# Patient Record
Sex: Male | Born: 1945 | Race: White | Hispanic: No | Smoking: Former smoker
Health system: Southern US, Community
[De-identification: ages and names within clinical notes are randomized; demographics above are authoritative.]

## PROBLEM LIST (undated history)

## (undated) DIAGNOSIS — K635 Polyp of colon: Secondary | ICD-10-CM

## (undated) DIAGNOSIS — I219 Acute myocardial infarction, unspecified: Secondary | ICD-10-CM

## (undated) DIAGNOSIS — E291 Testicular hypofunction: Secondary | ICD-10-CM

## (undated) DIAGNOSIS — M359 Systemic involvement of connective tissue, unspecified: Secondary | ICD-10-CM

## (undated) DIAGNOSIS — K219 Gastro-esophageal reflux disease without esophagitis: Secondary | ICD-10-CM

## (undated) DIAGNOSIS — Z973 Presence of spectacles and contact lenses: Secondary | ICD-10-CM

## (undated) DIAGNOSIS — K648 Other hemorrhoids: Secondary | ICD-10-CM

## (undated) DIAGNOSIS — Z8371 Family history of colonic polyps: Secondary | ICD-10-CM

## (undated) DIAGNOSIS — D472 Monoclonal gammopathy: Secondary | ICD-10-CM

## (undated) DIAGNOSIS — M1990 Unspecified osteoarthritis, unspecified site: Secondary | ICD-10-CM

## (undated) DIAGNOSIS — I10 Essential (primary) hypertension: Secondary | ICD-10-CM

## (undated) DIAGNOSIS — C9000 Multiple myeloma not having achieved remission: Secondary | ICD-10-CM

## (undated) DIAGNOSIS — D509 Iron deficiency anemia, unspecified: Secondary | ICD-10-CM

## (undated) DIAGNOSIS — E785 Hyperlipidemia, unspecified: Secondary | ICD-10-CM

## (undated) DIAGNOSIS — K2970 Gastritis, unspecified, without bleeding: Secondary | ICD-10-CM

## (undated) DIAGNOSIS — E119 Type 2 diabetes mellitus without complications: Secondary | ICD-10-CM

## (undated) DIAGNOSIS — I2510 Atherosclerotic heart disease of native coronary artery without angina pectoris: Secondary | ICD-10-CM

## (undated) DIAGNOSIS — D649 Anemia, unspecified: Secondary | ICD-10-CM

## (undated) DIAGNOSIS — Z83719 Family history of colon polyps, unspecified: Secondary | ICD-10-CM

## (undated) DIAGNOSIS — N400 Enlarged prostate without lower urinary tract symptoms: Secondary | ICD-10-CM

## (undated) HISTORY — DX: Gastritis, unspecified, without bleeding: K29.70

## (undated) HISTORY — PX: APPENDECTOMY: SHX54

## (undated) HISTORY — DX: Other hemorrhoids: K64.8

## (undated) HISTORY — DX: Hyperlipidemia, unspecified: E78.5

## (undated) HISTORY — PX: HAND SURGERY: SHX662

## (undated) HISTORY — DX: Monoclonal gammopathy: D47.2

## (undated) HISTORY — DX: Polyp of colon: K63.5

## (undated) HISTORY — DX: Multiple myeloma not having achieved remission: C90.00

## (undated) HISTORY — DX: Acute myocardial infarction, unspecified: I21.9

## (undated) HISTORY — PX: PROSTATE SURGERY: SHX751

## (undated) HISTORY — DX: Atherosclerotic heart disease of native coronary artery without angina pectoris: I25.10

## (undated) HISTORY — DX: Testicular hypofunction: E29.1

## (undated) HISTORY — DX: Unspecified osteoarthritis, unspecified site: M19.90

## (undated) HISTORY — DX: Family history of colon polyps, unspecified: Z83.719

## (undated) HISTORY — DX: Iron deficiency anemia, unspecified: D50.9

## (undated) HISTORY — DX: Anemia, unspecified: D64.9

## (undated) HISTORY — PX: OTHER SURGICAL HISTORY: SHX169

## (undated) HISTORY — PX: VASECTOMY: SHX75

## (undated) HISTORY — DX: Gastro-esophageal reflux disease without esophagitis: K21.9

## (undated) HISTORY — DX: Essential (primary) hypertension: I10

## (undated) HISTORY — DX: Benign prostatic hyperplasia without lower urinary tract symptoms: N40.0

## (undated) HISTORY — DX: Presence of spectacles and contact lenses: Z97.3

## (undated) HISTORY — DX: Family history of colonic polyps: Z83.71

---

## 1992-06-29 HISTORY — PX: KNEE SURGERY: SHX244

## 1999-11-27 ENCOUNTER — Ambulatory Visit (HOSPITAL_BASED_OUTPATIENT_CLINIC_OR_DEPARTMENT_OTHER): Admission: RE | Admit: 1999-11-27 | Discharge: 1999-11-27 | Payer: Self-pay | Admitting: *Deleted

## 2001-09-27 ENCOUNTER — Ambulatory Visit (HOSPITAL_BASED_OUTPATIENT_CLINIC_OR_DEPARTMENT_OTHER): Admission: RE | Admit: 2001-09-27 | Discharge: 2001-09-27 | Payer: Self-pay | Admitting: Urology

## 2003-05-09 ENCOUNTER — Encounter (INDEPENDENT_AMBULATORY_CARE_PROVIDER_SITE_OTHER): Payer: Self-pay | Admitting: Specialist

## 2003-05-09 ENCOUNTER — Ambulatory Visit (HOSPITAL_COMMUNITY): Admission: RE | Admit: 2003-05-09 | Discharge: 2003-05-09 | Payer: Self-pay | Admitting: Gastroenterology

## 2004-11-12 ENCOUNTER — Ambulatory Visit (HOSPITAL_BASED_OUTPATIENT_CLINIC_OR_DEPARTMENT_OTHER): Admission: RE | Admit: 2004-11-12 | Discharge: 2004-11-12 | Payer: Self-pay | Admitting: Orthopedic Surgery

## 2004-11-12 ENCOUNTER — Ambulatory Visit (HOSPITAL_COMMUNITY): Admission: RE | Admit: 2004-11-12 | Discharge: 2004-11-12 | Payer: Self-pay | Admitting: Orthopedic Surgery

## 2005-06-29 HISTORY — PX: CORONARY ANGIOPLASTY WITH STENT PLACEMENT: SHX49

## 2006-11-04 ENCOUNTER — Ambulatory Visit: Payer: Self-pay | Admitting: Internal Medicine

## 2006-11-15 ENCOUNTER — Ambulatory Visit: Payer: Self-pay

## 2006-12-03 ENCOUNTER — Ambulatory Visit: Payer: Self-pay | Admitting: Internal Medicine

## 2006-12-03 LAB — CONVERTED CEMR LAB
Basophils Relative: 0.5 % (ref 0.0–1.0)
Calcium: 9.5 mg/dL (ref 8.4–10.5)
Chloride: 98 meq/L (ref 96–112)
Creatinine, Ser: 1.1 mg/dL (ref 0.4–1.5)
Eosinophils Relative: 4.7 % (ref 0.0–5.0)
Glucose, Bld: 112 mg/dL — ABNORMAL HIGH (ref 70–99)
HCT: 40.4 % (ref 39.0–52.0)
INR: 0.9 (ref 0.9–2.0)
Neutrophils Relative %: 59.6 % (ref 43.0–77.0)
Prothrombin Time: 11.5 s (ref 10.0–14.0)
RBC: 4.67 M/uL (ref 4.22–5.81)
RDW: 12.6 % (ref 11.5–14.6)
Sodium: 137 meq/L (ref 135–145)
WBC: 6.5 10*3/uL (ref 4.5–10.5)
aPTT: 30.3 s (ref 26.5–36.5)

## 2006-12-06 ENCOUNTER — Inpatient Hospital Stay (HOSPITAL_BASED_OUTPATIENT_CLINIC_OR_DEPARTMENT_OTHER): Admission: RE | Admit: 2006-12-06 | Discharge: 2006-12-06 | Payer: Self-pay | Admitting: Cardiovascular Disease

## 2006-12-06 ENCOUNTER — Ambulatory Visit: Payer: Self-pay | Admitting: Cardiovascular Disease

## 2006-12-10 ENCOUNTER — Inpatient Hospital Stay (HOSPITAL_COMMUNITY): Admission: RE | Admit: 2006-12-10 | Discharge: 2006-12-11 | Payer: Self-pay | Admitting: Cardiology

## 2006-12-10 ENCOUNTER — Ambulatory Visit: Payer: Self-pay | Admitting: Cardiology

## 2006-12-23 ENCOUNTER — Ambulatory Visit: Payer: Self-pay | Admitting: Cardiovascular Disease

## 2006-12-23 ENCOUNTER — Ambulatory Visit: Payer: Self-pay | Admitting: Cardiology

## 2006-12-23 LAB — CONVERTED CEMR LAB
Basophils Absolute: 0 10*3/uL (ref 0.0–0.1)
Calcium: 9.4 mg/dL (ref 8.4–10.5)
Chloride: 100 meq/L (ref 96–112)
Creatinine, Ser: 1.1 mg/dL (ref 0.4–1.5)
Direct LDL: 97 mg/dL
Eosinophils Absolute: 0.3 10*3/uL (ref 0.0–0.6)
GFR calc non Af Amer: 72 mL/min
HCT: 38.2 % — ABNORMAL LOW (ref 39.0–52.0)
HDL: 34 mg/dL — ABNORMAL LOW (ref >39.0)
MCHC: 34 g/dL (ref 30.0–36.0)
Neutrophils Relative %: 60.2 % (ref 43.0–77.0)
Platelets: 257 10*3/uL (ref 150–400)
RBC: 4.42 M/uL (ref 4.22–5.81)
RDW: 12.2 % (ref 11.5–14.6)
Sodium: 139 meq/L (ref 135–145)
Total CHOL/HDL Ratio: 5.6
Triglycerides: 269 mg/dL (ref 0–149)
WBC: 6.5 10*3/uL (ref 4.5–10.5)

## 2007-01-03 ENCOUNTER — Ambulatory Visit: Payer: Self-pay | Admitting: Cardiology

## 2007-01-03 LAB — CONVERTED CEMR LAB
BUN: 20 mg/dL (ref 6–23)
Calcium: 9.2 mg/dL (ref 8.4–10.5)
Chloride: 106 meq/L (ref 96–112)
Creatinine, Ser: 1.1 mg/dL (ref 0.4–1.5)
GFR calc non Af Amer: 72 mL/min
Sodium: 140 meq/L (ref 135–145)

## 2007-01-12 ENCOUNTER — Ambulatory Visit: Payer: Self-pay | Admitting: Internal Medicine

## 2007-01-12 ENCOUNTER — Ambulatory Visit: Payer: Self-pay | Admitting: Cardiovascular Disease

## 2007-01-12 LAB — CONVERTED CEMR LAB
ALT: 28 units/L (ref 0–53)
AST: 24 units/L (ref 0–37)
Basophils Relative: 0.4 % (ref 0.0–1.0)
Bilirubin, Direct: 0.1 mg/dL (ref 0.0–0.3)
CO2: 31 meq/L (ref 19–32)
Calcium: 9.3 mg/dL (ref 8.4–10.5)
Eosinophils Absolute: 0.2 10*3/uL (ref 0.0–0.6)
Eosinophils Relative: 3.2 % (ref 0.0–5.0)
GFR calc Af Amer: 97 mL/min
GFR calc non Af Amer: 80 mL/min
Glucose, Bld: 99 mg/dL (ref 70–99)
HCT: 40.3 % (ref 39.0–52.0)
Hemoglobin: 13.7 g/dL (ref 13.0–17.0)
Lymphocytes Relative: 24.3 % (ref 12.0–46.0)
MCV: 86.4 fL (ref 78.0–100.0)
Monocytes Absolute: 0.5 10*3/uL (ref 0.2–0.7)
Neutro Abs: 3.5 10*3/uL (ref 1.4–7.7)
Neutrophils Relative %: 63.5 % (ref 43.0–77.0)
Sodium: 140 meq/L (ref 135–145)
Total Protein: 7 g/dL (ref 6.0–8.3)
WBC: 5.5 10*3/uL (ref 4.5–10.5)

## 2007-02-02 ENCOUNTER — Ambulatory Visit: Payer: Self-pay | Admitting: Internal Medicine

## 2007-02-03 ENCOUNTER — Encounter (HOSPITAL_COMMUNITY): Admission: RE | Admit: 2007-02-03 | Discharge: 2007-03-29 | Payer: Self-pay | Admitting: Internal Medicine

## 2007-02-24 ENCOUNTER — Ambulatory Visit: Payer: Self-pay | Admitting: Internal Medicine

## 2007-05-11 ENCOUNTER — Ambulatory Visit: Payer: Self-pay | Admitting: Internal Medicine

## 2007-10-27 ENCOUNTER — Ambulatory Visit: Payer: Self-pay | Admitting: Internal Medicine

## 2008-08-04 ENCOUNTER — Emergency Department (HOSPITAL_BASED_OUTPATIENT_CLINIC_OR_DEPARTMENT_OTHER): Admission: EM | Admit: 2008-08-04 | Discharge: 2008-08-05 | Payer: Self-pay | Admitting: Emergency Medicine

## 2008-08-05 ENCOUNTER — Ambulatory Visit: Payer: Self-pay | Admitting: Diagnostic Radiology

## 2008-08-08 ENCOUNTER — Emergency Department (HOSPITAL_BASED_OUTPATIENT_CLINIC_OR_DEPARTMENT_OTHER): Admission: EM | Admit: 2008-08-08 | Discharge: 2008-08-08 | Payer: Self-pay | Admitting: Emergency Medicine

## 2008-11-23 DIAGNOSIS — R42 Dizziness and giddiness: Secondary | ICD-10-CM

## 2008-11-23 DIAGNOSIS — E669 Obesity, unspecified: Secondary | ICD-10-CM | POA: Insufficient documentation

## 2009-01-16 ENCOUNTER — Encounter (INDEPENDENT_AMBULATORY_CARE_PROVIDER_SITE_OTHER): Payer: Self-pay | Admitting: *Deleted

## 2009-03-15 ENCOUNTER — Encounter: Payer: Self-pay | Admitting: Internal Medicine

## 2009-05-22 ENCOUNTER — Ambulatory Visit: Payer: Self-pay | Admitting: Internal Medicine

## 2009-05-22 DIAGNOSIS — G4730 Sleep apnea, unspecified: Secondary | ICD-10-CM

## 2009-05-22 DIAGNOSIS — I10 Essential (primary) hypertension: Secondary | ICD-10-CM

## 2009-05-22 DIAGNOSIS — G4710 Hypersomnia, unspecified: Secondary | ICD-10-CM | POA: Insufficient documentation

## 2009-07-02 ENCOUNTER — Encounter: Payer: Self-pay | Admitting: Internal Medicine

## 2009-08-28 ENCOUNTER — Encounter: Payer: Self-pay | Admitting: Internal Medicine

## 2010-06-10 ENCOUNTER — Ambulatory Visit
Admission: RE | Admit: 2010-06-10 | Discharge: 2010-06-10 | Payer: Self-pay | Source: Home / Self Care | Attending: Orthopedic Surgery | Admitting: Orthopedic Surgery

## 2010-06-29 DIAGNOSIS — D509 Iron deficiency anemia, unspecified: Secondary | ICD-10-CM

## 2010-06-29 HISTORY — DX: Iron deficiency anemia, unspecified: D50.9

## 2010-07-03 ENCOUNTER — Encounter: Payer: Self-pay | Admitting: Internal Medicine

## 2010-07-10 ENCOUNTER — Encounter: Payer: Self-pay | Admitting: Internal Medicine

## 2010-08-18 NOTE — Op Note (Signed)
  [name], [name]               ACCOUNT NO.:  [id number]  MEDICAL RECORD NO.:  [id number]          PATIENT TYPE:  AMB  LOCATION:  DSC                          FACILITY:  [facility]  PHYSICIAN:  Cindee Salt, M.D.       DATE OF BIRTH:  May 20, 1946  DATE OF PROCEDURE:  06/10/2010 DATE OF DISCHARGE:                              OPERATIVE REPORT   PREOPERATIVE DIAGNOSIS:  Carpal tunnel syndrome, right hand.  POSTOPERATIVE DIAGNOSIS:  Carpal tunnel syndrome, right hand.  OPERATION:  Decompression right median nerve.  SURGEON:  Cindee Salt, MD  ASSISTANT:  Betha Loa, MD  ANESTHESIA:  Forearm-based IV regional with local infiltration.  ANESTHESIOLOGIST:  Janetta Hora. Frederick, MD  HISTORY:  The patient is a 64 year old male with a history of carpal tunnel syndrome, EMG nerve conductions positive that has not responded to conservative treatment.  He has elected to undergo surgical decompression.  Pre, peri, postoperative course have been discussed along with risks and complications.  He is aware that there is no guarantee with the surgery, possibility of infection, recurrence of injury to arteries, nerves, tendons, incomplete relief of symptoms, dystrophy.  In the preoperative area, the patient is seen, the extremity marked by both the patient and surgeon, antibiotic given.  PROCEDURE:  The patient was brought to the operating room where a forearm-based IV regional anesthetic was carried out without difficulty. He was prepped using ChloraPrep, supine position, right arm free.  A 3- minute dry time was allowed, time-out taken, confirming the patient and procedure.  A longitudinal incision was made in the palm, carried down through subcutaneous tissue.  Bleeders were electrocauterized.  Palmar fascia was split, superficial palmar arch identified, the flexor tendon to the ring and little finger identified to the ulnar side of median nerve.  Carpal retinaculum was incised with sharp  dissection.  Right angle and Sewall retractor were placed between skin and forearm fascia. Fascia was released for approximately a centimeter and half proximal to the wrist crease under direct vision.  Palmaris profundus tendon was immediately noted directly on top of the nerve and adherent to this. This was dissected free.  This was then sutured to the overlying carpal retinaculum with a 4-0 Vicryl suture.  The wound was copiously irrigated with saline.  The skin then closed with interrupted 5-0 Vicryl Rapide sutures.  A local infiltration with 0.25% percent Marcaine without epinephrine was given.  On deflation of the tourniquet, all fingers immediately pinked. He was taken to the recovery room for observation in satisfactory condition.  He will be discharged home to return to the [facility] of [place] in 1 week on Vicodin.          ______________________________ Cindee Salt, M.D.     GK/MEDQ  D:  06/10/2010  T:  06/11/2010  Job:  [id number]  Electronically Signed by Cindee Salt M.D. on 08/18/2010 12:35:29 PM

## 2010-09-08 ENCOUNTER — Other Ambulatory Visit: Payer: Self-pay | Admitting: Gastroenterology

## 2010-09-08 LAB — BASIC METABOLIC PANEL
BUN: 20 mg/dL (ref 6–23)
Chloride: 105 mEq/L (ref 96–112)
Creatinine, Ser: 1 mg/dL (ref 0.4–1.5)
GFR calc Af Amer: >60 mL/min (ref >60)
GFR calc non Af Amer: >60 mL/min (ref >60)
Potassium: 3.9 mEq/L (ref 3.5–5.1)

## 2010-09-08 LAB — POCT HEMOGLOBIN-HEMACUE: Hemoglobin: 12.2 g/dL — ABNORMAL LOW (ref 13.0–17.0)

## 2010-11-11 NOTE — Cardiovascular Report (Signed)
NAMEMARQUEE, [name]               ACCOUNT NO.:  [id number]   MEDICAL RECORD NO.:  [id number]          PATIENT TYPE:  INP   LOCATION:  6525                         FACILITY:  [facility]   PHYSICIAN:  Salvadore Farber, MD  DATE OF BIRTH:  [birth year]   DATE OF PROCEDURE:  12/10/2006  DATE OF DISCHARGE:                            CARDIAC CATHETERIZATION   PROCEDURES:  1. Drug-eluting stent placement in the mid-LAD.  2. Intravascular ultrasound of the LAD.  3. AngioSeal closure of the right common femoral arteriotomy site.   INDICATIONS:  [name] is a 65 year old gentleman with  atherosclerotic coronary disease.  He presents with episodes of  presyncope and second-degree AV block.  Echocardiogram suggested prior  inferior infarct which was apparently asymptomatic.  That prompted a  Myoview study that showed evidence of ischemia.  He then underwent  diagnostic angiography by Dr. Excell Seltzer, which demonstrated occlusion of  the distal circumflex with moderate collateralization and then a 90%  stenosis of the mid-LAD.  Plan was made for percutaneous intervention on  the LAD with medical management of the circumflex.   PROCEDURAL TECHNIQUE:  Informed consent was obtained.  The patient  consented to participate in the Eminence Study, comparing two methods of  anticoagulation during percutaneous coronary intervention.   Under 1% lidocaine local anesthesia, a 6-French sheath was placed in the  right common femoral artery using the modified Seldinger technique.  Anticoagulation was initiated per Eminence protocol.  A 6-French CLS 3.5  guide was advanced over a wire and engaged in the ostium of the left  main.  A Prowater wire was advanced to the distal LAD without  difficulty.  I directly stented the lesion using a 2.5 x 28-mm CYPHER,  deployed at 18 atmospheres.  I post dilated the stent using a 2.75 x 20-  mm Quantum at 18 atmospheres.  I then performed an intravascular  ultrasound.  That  demonstrated full expansion throughout the stented  segment.  There was excellent apposition at both ends but approximately  3-mm area in the mid stent that was not fully apposed.  This was then a  segment of relatively normal vessels.  I then proceeded to additional  post dilation using a 3.0 x 20-mm Quantum at 18 atmospheres covering the  entirety of the stented segment then a 3.5 x 20-mm Quantum in the mid  stent at the site of the incomplete apposition.  I then repeated  intravascular ultrasound which demonstrated no evidence of dissection,  excellent expansion throughout, and an approximately 2-mm segment of  incomplete apposition.  It did not look as if this area could be made to  be opposed.  We therefore completed the procedure.  The arteriotomy was  closed using AngioSeal device.  Complete hemostasis was obtained and the  patient was transferred to holding room in stable condition.   COMPLICATIONS:  None.   IMPRESSION/RECOMMENDATIONS:  Successful placement of a drug-eluting  stent in the mid circumflex extending from just beyond the first  diagonal to just before a large septal perforator.  It reduced the  stenosis from 90% to 0%.  He should be maintained on both aspirin and  Plavix indefinitely.      Salvadore Farber, MD  Electronically Signed     WED/MEDQ  D:  12/10/2006  T:  12/10/2006  Job:  [id number]   cc:   Berton Mount, MD  Mosetta Putt, M.D.

## 2010-11-11 NOTE — H&P (Signed)
NAMELARANCE, RATLEDGE               ACCOUNT NO.:  [id number]   MEDICAL RECORD NO.:  [id number]          PATIENT TYPE:  INP   LOCATION:  6525                         FACILITY:  [facility]   PHYSICIAN:  Salvadore Farber, MD  DATE OF BIRTH:  February 22, 1946   DATE OF ADMISSION:  12/10/2006  DATE OF DISCHARGE:                              HISTORY & PHYSICAL   PRIMARY CARDIOLOGIST:  Dr. Sherryl Manges   PRIMARY CARE PHYSICIAN:  Mosetta Putt, M.D.   PATIENT PROFILE:  65 year old married Caucasian male with recent  diagnosis of coronary artery disease presents today for PCI and stenting  of the LAD.   PROBLEMS:  1. CAD.      a.     May 2008 Myoview study suggestive of inferior scar with EKG       suggestive of inferior infarct.      b.     December 06, 2006 left heart cardiac catheterization. Left main       was mildly calcified, the LAD had a focal high-grade stenosis of       80-90% in the midportion of the vessel.  There was a 70% stenosis       also in the midportion.  Left circumflex was a large caliber       vessel.  There is nonobstructive disease throughout the circumflex       and obtuse marginal.  RCA had 40-50% stenosis in the mid section       of the artery.  EF was 60%.  2. Reflux esophagitis.  3. Abnormal glucose tolerance tests.  4. Hypertension.  5. History of symptoms consistent with obstructive sleep apnea pending      sleep study.  6. Status post knee surgery.  7. Status post hand surgery.  8. Status post shoulder surgery.   ALLERGIES:  NO KNOWN DRUG ALLERGIES.   HISTORY OF PRESENT ILLNESS:  65 year old married Caucasian male with the  above problem list who recently saw Dr. Sherryl Manges in clinic on Nov 04, 2006 secondary to symptoms of lightheadedness and presyncope.  Following  this visit he underwent a Myoview to rule out ischemia as he was noted  to have inferior infarct on EKG.  Myoview was subsequently abnormal and  catheterizations was performed June 9 revealing  significant LAD disease.  He presents back today for PCI.  He never had any chest pain.  He does  experience dyspnea on exertion which has been stable.  He offers no  complaints since his catheterization June 9.   HOME MEDICATIONS:  1. Lovastatin 40 mg daily.  2. Chlorthalidone 25 mg daily.  3. Lisinopril 40 mg daily.  4. Aspirin 81 mg daily.  5. Multivitamin one daily.  6. Plavix 75 mg daily.   FAMILY HISTORY:  Is noncontributory.   SOCIAL HISTORY:  He is remarried and has two children.  His wife has two  children.  Together they have six grandchildren.  He does not use  tobacco or drugs and drinks a couple of beers a week.   REVIEW OF SYSTEMS:  Positive for dyspnea on exertion  and lightheaded  episodes associated with a rushing feeling.  Otherwise all systems  reviewed negative.   PHYSICAL EXAM:  VITAL SIGNS:  Temperature 97.4, heart rate 66,  respirations 20, blood pressure 136/82, pulse ox 95%, and weighs 90.4  kg.  GENERAL:  Pleasant white male in no acute distress.  Awake, alert and  oriented x3.  NECK:  No bruits or JVD.  LUNGS:  Respirations were unlabored.  Clear to auscultation.  CARDIAC:  Regular S1 and S2, no S3-4 murmurs.  ABDOMEN: Soft, nontender.  Bowel sounds present in four quadrants.  EXTREMITIES:  Warm, dry, and pink. No clubbing, cyanosis or edema.  Dorsalis pedis and posterior tibial pulses 2+ and equal bilaterally.  Right groin site which is used for catheterization included bleeding,  bruising, hematoma.   ACCESSORY CLINICAL FINDINGS:  None.   ASSESSMENT:  1. Coronary artery disease.  The patient presents today for      percutaneous coronary intervention and stenting of the left      anterior descending.  He remains on aspirin, Plavix and ACE      inhibitor and statin therapy.  2. Hypertension stable.  Continue home medications.  3. Hyperlipidemia.  He takes lovastatin at home.  4. Questionable sleep apnea.  The patient was to be scheduled for       outpatient sleep study by PCP.      Nicolasa Ducking, ANP      Salvadore Farber, MD  Electronically Signed    CB/MEDQ  D:  12/10/2006  T:  12/10/2006  Job:  [id number]

## 2010-11-11 NOTE — Discharge Summary (Signed)
NAMEHERSEY, MACLELLAN               ACCOUNT NO.:  [id number]   MEDICAL RECORD NO.:  [id number]          PATIENT TYPE:  INP   LOCATION:  6525                         FACILITY:  [facility]   PHYSICIAN:  Gerrit Friends. Dietrich Pates, MD, FACCDATE OF BIRTH:  Nov 12, 1945   DATE OF ADMISSION:  12/10/2006  DATE OF DISCHARGE:                         DISCHARGE SUMMARY - REFERRING   DISCHARGING PHYSICIAN:  Gerrit Friends. Dietrich Pates, MD, [facility]   DISCHARGE DIAGNOSES:  1. Near syncope.  2. 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  3. Abnormal stress Myoview.  4. Coronary artery disease.  5. Status post drug-eluting stent to the LAD.  6. History of hypertension.  7. Probable obstructive sleep apnea.   PROCEDURES PERFORMED:  Status post drug-eluting stent to the LAD by Dr.  Samule Ohm on October 10, 2006.   SUMMARY OF HISTORY:  Mr. Gambale is a 65 year old male who initially  presented to Dr. Graciela Husbands on May 8th, with abrupt episodes of  lightheadedness, pre-syncope.  He did not have any accompanying  palpitations.  A Holter monitor showed frequent PVCs, two episodes of  second-degree heart block.  Echocardiogram was performed, as well as a  stress test.  Stress test showed a EF of 64% with mild inferior  ischemia.  Cardiac catheterization was arranged on December 06, 2006.  This  showed a severe LAD stenosis, moderate left circumflex stenosis with an  occluded left PO branch, mild to moderate RCA stenosis, normal LV  function.  After review, Dr. Excell Seltzer felt that he would favor  intervention to the mid-LAD, thus his re-admission.   LABORATORY DATA:  Admission H&H is 14.4 and 42.6, normal indices,  platelets 229, WBCs 6.4.  At time of discharge, H&H was 12.9 and 38.4,  normal indices, platelets 222, WBCs 6.9, PTT 33 on the 14th.  Admission  sodium 137, potassium 3.6, BUN 17, creatinine 1.05, glucose 114, normal  LFTs.  Prior to discharge, sodium was 139, potassium 2.7, glucose 124,  BUN 15, creatinine 1.12.  CK-MBs  and relative indexes and troponins were  unremarkable.  EKGs show normal sinus rhythm, early R-wave, inferior Q-  waves.   HOSPITAL COURSE:  Mr. Minturn was brought into the hospital.  He  underwent drug-eluting stent to the LAD and Angio-Seal to the right  femoral artery.  Dr. Samule Ohm reduced the LAD lesion from 90% to 0% with a  Cipher stent.  Recommended aspirin and Plavix indefinitely.  He was  placed into the Eminence trial.  Post sheath removal and bedrest, the  patient was ambulating without difficulty.  Catheterization site was  unremarkable.  On the 14th, it was felt that he could be discharged  home.   DISPOSITION:  The patient is discharged home.  His activity and wound  care as per supplemental sheets.  He received a new prescription for  Plavix 75 mg daily.  He was asked to continue aspirin 325 mg daily,  lisinopril 40 mg daily, chlorthalidone 25 mg daily, lovastatin 40 mg  q.h.s., nitroglycerin 0.4 as needed.  He will follow up with Dr.  Graciela Husbands on December 27, 2006 at 11:40 and  with Dr. Duaine Dredge as needed.  At the  time of follow-up with primary care M.D., obstructive sleep apnea  evaluation should be performed.   DISCHARGE TIME:  45 minutes.      Joellyn Rued, PA-C      Gerrit Friends. Dietrich Pates, MD, [facility]  Electronically Signed    EW/MEDQ  D:  12/11/2006  T:  12/11/2006  Job:  [id number]   cc:   Duke Salvia, MD, [facility]  Mosetta Putt, M.D.

## 2010-11-11 NOTE — Cardiovascular Report (Signed)
Gonzalez, [name]               ACCOUNT NO.:  [id number]   MEDICAL RECORD NO.:  [id number]          PATIENT TYPE:  OIB   LOCATION:  1961                         FACILITY:  [facility]   PHYSICIAN:  Veverly Fells. Excell Seltzer, MD  DATE OF BIRTH:  [birth year]   DATE OF PROCEDURE:  DATE OF DISCHARGE:  12/06/2006                            CARDIAC CATHETERIZATION   DATE OF PROCEDURE:  12/06/2006.   PROCEDURE:  Left heart catheterization, selective coronary angiography,  left ventricular angiography.   INDICATIONS:  [name] is a 65 year old gentleman who has had  recurrent lightheaded spells.  His symptoms occur with exertion.  He has  undergone a stress nuclear study that was suggestive of inferior scar  and his EKG is suggestive of an inferior infarct as well.  He was  referred for cardiac catheterization in the setting of his symptoms and  abnormal findings on stress nuclear study.   Risks and indications of procedure were explained in detail to the  patient.  Informed consent was obtained.  The right groin was prepped,  draped and anesthetized with 1% lidocaine.  Using modified Seldinger  technique a 4-French sheath was placed in the right femoral artery.  Multiple views of the left and right coronary arteries were taken.  For  the left coronary artery a 4-French JL-4 catheter was used, for the  right coronary artery a 4-French 3-D RC catheter was used.  Following  selective coronary angiography an angled pigtail catheter was inserted  into the left ventricle and pressures were recorded.  Left  ventriculogram was performed and pullback across the aortic valve was  done.   FINDINGS:  Aortic pressure 123/65 with a mean of 90, left ventricular  pressure is 134/4 with an end-diastolic pressure of 14.   The left mainstem is mildly calcified.  It bifurcates into the LAD and  left circumflex.   The LAD is a large-caliber vessel that courses down to the left  ventricular apex.  The proximal  portion of the vessel has minor luminal  irregularities.  There is a large first diagonal branch and just after  the bifurcation of the first diagonal branch there is a segment of  nonobstructive disease followed  by focal high-grade stenosis in the mid-  LAD of 80-90%.  There is a second area of stenosis in the range of 70%  involving the mid-LAD.  Overall there is a long segment of serial  disease in the midportion of the LAD.  The diagonal branch does not  appear to have significant angiographic stenosis.  The remaining  portions of the distal LAD have minor nonobstructive disease.   The left circumflex is a large-caliber vessel.  The first and second OM  branches are both large.  Both branches have minor nonobstructive  disease.  The segment of the left circumflex between the two marginal  branches has an area of 40-50% stenosis.  The AV groove circumflex  beyond the second OM branch is a small-caliber vessel.  The distal  circumflex is subtotally occluded with what appears to be two  posterolateral branches filling  late.  These  branches appear very  small.   The right coronary artery has mild calcification.  There is  nonobstructive focal stenosis in both the proximal and mid right  coronary artery in the range of 40-50%.  Right coronary artery then  courses down and has minor luminal irregularities in its distal portion  and gives off a right PDA branch as well as two small right  posterolateral branches.  There is a mid sized acute marginal branch  from the midportion of the vessel.   Left ventriculography performed in the 30 degree right anterior oblique  projection demonstrates normal left ventricular systolic function with  an LVEF estimated at 60%.   ASSESSMENT:  1. Severe LAD stenosis.  2. Moderate left circumflex stenosis with an occluded left      posterolateral branch.  3. Mild to moderate right coronary artery stenosis.  4. Normal left ventricular function.    PLAN:  I discussed findings in detail with [name] and his family.  At this point I would favor percutaneous intervention of the mid-LAD.  I  think we could cover the area of disease with a single long drug-eluting  stent.  I would like to review his films with some of my colleagues  prior to making a final decision regarding the best option at  revascularization.  He does have disease in the left circumflex but it  involves a subtotal occlusion of a small branch and I suspect that it is  not an area that would be graftable.  It also correlates with the area  of infarct on his Myoview study.  I have given Mr. Scheck a  prescription for clopidogrel.  After reviewing his films further, I will  be in contact with him and we will likely schedule PCI.      Veverly Fells. Excell Seltzer, MD  Electronically Signed     MDC/MEDQ  D:  12/06/2006  T:  12/06/2006  Job:  [id number]   cc:   Duke Salvia, MD, [facility]  Dr. Cora Daniels

## 2010-11-11 NOTE — Letter (Signed)
February 24, 2007    Mosetta Putt, M.D.  [address] [place], [place] [address]   RE:  ARYEH, BUTTERFIELD  MRN:  [id number]  /  DOB:  Sep 26, 1945   Dear [name]:   Mr. Barfield comes in today.  It is a pleasure to see him again.  He has  had two dizzy spells since we last saw him, but as you know, there has  been a whole bunch of stuff that has happened.  He underwent Myoview  scan that was abnormal, underwent catheterization by Geralynn Rile.  He  underwent stenting of his LAD.  He continues to be fatigued.   After the procedure, he was started on a just a whiff of Toprol which he  has tolerated thus far; the dose is 12.5 daily.  He is also on Plavix  75, aspirin, lovastatin 40, chlorthalidone 25, lisinopril 40.   On examination today, his blood pressure today was 126/80.  His pulse  was 64.  His lungs were clear.  His heart sounds were regular.  Extremities were without edema.   Event recorder obtained, end of service date being February 04, 2007,  demonstrated sinus rhythm with occasional first degree AV block but  evidence of second degree block.  Review of his lipids demonstrated  triglycerides of 260, HDL of 34, LDL was 97.  These were obtained, I  think, on his current dose of lovastatin 40 mg.   IMPRESSION:  1. Intermittent second degree heart block, question resolved.  2. Presyncopal episode, question mechanism.  3. Ischemic heart disease.      a.     Status post left anterior descending drug-eluting stenting.      b.     Normal left ventricular function.  4. Dyslipidemia with elevated triglycerides and low LDL.  5. Obesity.   [name], I have arranged for Mr. Cocuzza to go to cardiac rehabilitation.  I have asked that he follow up with you in the next couple of weeks to  get his fasting lipids checked.  At this point, we would want an LDL  target of 70 mg/dL, and certainly his triglycerides and HDL might  benefit from therapy as well.  I have taken the liberty of  increasing  his Toprol from 12.5 to 25 as the event recording demonstrated no second  degree heart block, and I have initiated cardiac rehabilitation for him,  in hopes that he can get some exercise and get some weight off.   We will plan to see him again in about four months' time.    Sincerely,      Duke Salvia, MD, [facility]  Electronically Signed    SCK/MedQ  DD: 02/24/2007  DT: 02/25/2007  Job #: [id number]

## 2010-11-11 NOTE — Letter (Signed)
October 27, 2007    Mosetta Putt, M.D.  [address] [place], [place] [address]   RE:  THOR, NANNINI  MRN:  [id number]  /  DOB:  October 19, 1945   Dear [name]:   Mr. Fraley is doing quite well one year following revascularization.  Apparently you changed his statin to Lipitor recently.  I do not have  access to these numbers, but in the past his HDL was also running a  little bit low.   He is tolerating his other medications.  He did ask if he could come off  of his Plavix (see below).   On examination today his blood pressure 140/82, his pulse was 58.  LUNGS:  Clear.  Heart sounds were regular.  EXTREMITIES:  Without edema.  SKIN:  Warm and dry.   Electrocardiogram dated today demonstrated sinus rhythm at 58 with  intervals of 0.20//0.10/0.44.   IMPRESSION:  1. Ischemic heart disease.      a.     Status post left anterior descending artery percutaneous       coronary intervention.      b.     Moderate nonobstructive circumflex disease.  2. Transient second-degree heart block.  3. Dyslipidemia with an elevated LDL and decreased HDL.   Mr. Sherk is doing pretty well on his current medications.  I did  mention to him to ask you about whether his HDL was still low, whether  it might need therapy.   I will plan to see him again one year's time, Pete.  If there is  anything I can do in the interim, please do not hesitate to contact me.    Sincerely,      Duke Salvia, MD, [facility]  Electronically Signed    SCK/MedQ  DD: 10/27/2007  DT: 10/27/2007  Job #: [id number]

## 2010-11-11 NOTE — Letter (Signed)
Nov 04, 2006    Mosetta Putt, M.D.  [address] [place], [place] [address]   RE:  [name], [name]  MRN:  [id number]  /  DOB:  September 01, 1945   Dear [name],   It as a pleasure to see your patient today regarding his spells of  lightheadedness.   As you know, he is a pleasant 65 year old father of two and grandfather  of six who has had spells over the last couple of months where he feels  abrupt lightheadedness and on a couple of occasions, presyncope.  He  describes it as a rush to his head.  It lasts about 10-15 seconds or so  and then abates.  There is no accompanying epi phenomenon.  It is  occurring now 3-4 times a week.  The most severe spell occurred while he  was sitting at his computer.   There is no accompanying palpitations.  He has no shortness of breath,  no chest pain.  He does have occasional peripheral edema.  No nocturnal  dyspnea or orthopnea.  He does have longstanding hypertension, which has  recently gotten better, for reasons that he is not sure of.   You obtained a Holter monitor because of his symptoms, and it was  notable for frequent PVCs and two episodes of second-degree heart block  occurring at 8:00 in the morning, well after he was awake.  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   His past cardiac history is notable for an echo done at [facility]  about a year and a half ago that was normal.   PAST MEDICAL HISTORY:  1. Reflux esophagitis.  2. Abnormal glucose tolerance.  3. Hypertension.  4. He has symptoms consistent with obstructive sleep apnea with      daytime somnolence and poor sleep as well as snoring.   PAST SURGICAL HISTORY:  Notable for knee surgery, hand surgery, and  shoulder surgery.   His review of systems, in addition to the above, is notable for fatigue  and arthritis.  It is otherwise broadly negative across multiple organ  systems.   MEDICATIONS:  1. Lovastatin 40.  2.  Chlorthalidone 25.  3. Lisinopril 40.  4. Aspirin 81.   He has no known drug allergies.   SOCIAL HISTORY:  He is remarried.  He has two children.  His wife has  two children.  Together they have six grandchildren.  He does not use  cigarettes or recreational drugs.  He does drink a couple of beers a  week.   PHYSICAL EXAMINATION:  GENERAL:  He is a middle to older age Caucasian  male appearing his stated age of 16.  VITAL SIGNS:  Weight was 209.  His blood pressure was 104/70 with a  pulse of 87.  HEENT:  No icterus or xanthomata.  NECK:  Neck veins are flat.  Carotids are brisk and full bilaterally  without bruits.  BACK:  Without kyphosis or scoliosis.  LUNGS:  Clear.  HEART:  Heart sounds are regular without murmurs or gallops.  ABDOMEN:  Soft with active bowel sounds without midline pulsation or  hepatomegaly.  EXTREMITIES:  Femoral pulses are 2+.  Distal pulses are intact.  There  is no clubbing, cyanosis or edema.  NEUROLOGIC:  Grossly normal.  SKIN:  Warm and dry.   Electrocardiogram dated today demonstrated sinus rhythm at 87 with  intervals of 0.19/0.10/0.36.  There were Q waves in the inferolateral  leads in II, III, and F, that could either be a possible infarct or  simply physiological Q waves.  There is also poor R wave progression.   Holter monitor is recorded earlier.  Thank you so kindly for sending  that for me to review.   IMPRESSION:  1. Presyncopal spells.  2. 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  3. 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  4. Poor sleeping with daytime somnolence and fatigue, suggestive of      obstructive sleep apnea.  5. Cardiac risk factors.      a.     Family history.      b.     Hypertension.      c.     Dyslipidemia.      d.     Probable metabolic syndrome.   DISCUSSION:  [name], Mr. Candelas  has spells that are quite concerning, and  I share your concern about them, and I also share your concern about his  Holter monitor.  I think, given the frequency of these episodes, an  event recorder will be useful to try and clarify the mechanism.  They  may be PVCs, they may be heart block, they may be vasomotor.  I am  mostly concerned about his electrocardiogram, and as we talked about it,  we will plan to get a Myoview scan to look for perfusion to see if in  fact a myocardial infarction has occurred and if so, if he has any areas  of ischemia.   He is open to following up with you about the sleep study at this point.  He smiled when I mentioned it to him that you had been trying to get him  to do this for some time.   [name], thanks very much for asking [place] to see him, and I will forward the  results of the aforementioned testing to you as it comes back.    Sincerely,      Duke Salvia, MD, [facility]  Electronically Signed    SCK/MedQ  DD: 11/04/2006  DT: 11/04/2006  Job #: [id number]

## 2010-11-11 NOTE — Assessment & Plan Note (Signed)
[facility]                            CARDIOLOGY OFFICE NOTE   NAME:COLLINSAmarius, Toto                        MRN:          [id number]  DATE:12/23/2006                            DOB:          Jul 13, 1945    CARDIOLOGIST:  Duke Salvia, MD, [facility]   PRIMARY CARE PHYSICIAN:  Mosetta Putt, M.D.   HISTORY OF PRESENT ILLNESS:  Mr. Poole is a 65 year old male patient  who initially saw Dr.  Graciela Husbands Nov 04, 2006 for chief complaint of near  syncope. Stress testing revealed prior inferior infarct. Holter monitor  also demonstrated second degree heart block type 2. The patient was set  up for cardiac catheterization. This was done by Dr.  Excell Seltzer on June  9th. This revealed severe LAD stenosis with mid stenosis of 80-90% and a  second area of 70% stenosis. The left circumflex had 40-50% stenosis  between the two marginal branches and a distal subtotal occlusion with  what appeared to be two posterolateral branches filling late. These were  small. The proximal mid RCA had 40-50% stenosis. His EF was 60% on left  ventriculogram. It was felt that the occlusion in the distal circumflex  would not be graftable and this was treated medically. He was set up for  percutaneous coronary intervention of his LAD. This was done by Dr.  Samule Ohm on December 10, 2006 with a CYPHER drug-eluting stent. The patient  was enrolled in the Eminence trial. He returns today for followup. He  notes he is doing well without any chest pain, shortness of breath. He  denies any syncope or near syncope. Denies any palpitations. Denies any  orthopnea, paroxysmal nocturnal dyspnea or pedal edema.   CURRENT MEDICATIONS:  1. Lovastatin 40 mg q nightly.  2. Chlorthalidone 25 mg a day.  3. Lisinopril 40 mg daily.  4. Multivitamin daily.  5. Aspirin 325 mg daily.  6. Plavix 75 mg daily.   ALLERGIES:  None.   PHYSICAL EXAMINATION:  He is a well-nourished, well-developed male in no  acute  distress. Blood pressure 123/78, pulse 75, weight is 205 pounds.  HEENT: Is normal.  NECK: Without JVD.  CARDIAC: Normal S1, S2, regular rate and rhythm without murmur.  LUNGS:  Are clear to auscultation bilaterally without wheezing, rhonchi  or rales.  ABDOMEN: Soft and nontender with normoactive bowel sounds. No  organomegaly.  EXTREMITIES: Without edema. Calves are soft and nontender.  SKIN: Is warm and dry.  NEUROLOGIC: He is alert and oriented x3. Cranial nerves II-XII are  grossly intact.  Right femoral arteriotomy site without hematoma.   Electrocardiogram reveals sinus rhythm with a heart rate of 62. Normal  axis and no acute changes.   IMPRESSION:  1. Coronary artery disease.      a.     Status post CYPHER drug-eluting stent placement to the mid       LAD December 10, 2006.      b.     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      c.     Eminence trial.      d.     Residual non-obstructive disease as outlined above.  2. Preserved left ventricular function with an ejection fraction of      60%.  3. History of near syncope.      a.     Recent Holter monitor revealing second-degree heart block       type 2.  4. Treated hypertension.  5. Treated dyslipidemia.  6. History of possible obstructive sleep apnea.  7. Past history of reflux esophagitis.   PLAN:  The patient presents to the office today for post intervention  followup as well as followup as part of the Eminence trial. He is doing  well from a cardiovascular standpoint. He will continue all of his  medications as listed above. I have recommended that he followup with  Dr.  Graciela Husbands in the next four weeks. There is still the outstanding issue  of his second-degree heart block type 2. I question whether or not we  should put him on a beta-blocker as treatment of his coronary disease  and prior infarct. I will be in touch with Dr.  Graciela Husbands  and this can  certainly be addressed when he sees Dr.  Graciela Husbands back in four weeks time.      Tereso Newcomer, PA-C  Electronically Signed      Arturo Morton. Riley Kill, MD, [facility]  Electronically Signed   SW/MedQ  DD: 12/23/2006  DT: 12/23/2006  Job #: [id number]   cc:   Mosetta Putt, M.D.

## 2010-11-14 NOTE — Op Note (Signed)
[name], [name]                           ACCOUNT NO.:  [id number]   MEDICAL RECORD NO.:  [id number]                   PATIENT TYPE:  AMB   LOCATION:  ENDO                                 FACILITY:  [facility]   PHYSICIAN:  Bernette Redbird, M.D.                DATE OF BIRTH:  Jun 29, 1946   DATE OF PROCEDURE:  05/09/2003  DATE OF DISCHARGE:                                 OPERATIVE REPORT   PROCEDURE:  Colonoscopy with polypectomy.   INDICATIONS FOR PROCEDURE:  A 65 year old gentleman for colon cancer  screening.   FINDINGS:  Small polyp in the descending colon.   DESCRIPTION OF PROCEDURE:  The nature, purpose and risk of the procedure had  been discussed with the patient who provided written consent. Sedation was  fentanyl 100 mcg and Versed 7 mg IV prior to and during this procedure and  the upper endoscopy which preceded it. Digital exam of the prostate was  normal. The Olympus adult video colonoscope was advanced with substantial  difficulty due to looping, noted primarily once we got to the level of the  ileocecal valve. Getting down into the base of the cecum was a real chore.  Eventually, we had already turned the patient into the supine position but  we flipped him into the right lateral decubitus position and were able to  get into the base of the cecum. The appendiceal orifice was clearly  identified and a careful cecal inspection was performed prior to withdrawal  of the scope. The quality of the prep was very good and it is felt that all  areas were adequately seen. I did not enter the terminal ileum.   In the descending colon at 40 cm was a 5 mm semipedunculated polyp snared  off with complete hemostasis. The polyp was retrieved for histologic  analysis. A cold biopsy of it had already been obtained to make sure we  would have some tissue for analysis.   There were a couple of scattered diverticula in the mid colon.   No large polyps, cancer, colitis, vascular  malformations or extensive  diverticulosis were noted. Retroflexion could not be accomplished in the  rectum despite a couple of attempts, probably due to a small ampulla.  Reinspection of the rectum, as well as careful antegrade viewing of the  distal rectum, showed no abnormalities. The patient tolerated this procedure  well and there were no apparent complications.    IMPRESSION:  1. Small colon polyp removed as described above.  2. Minimal diverticulosis.   PLAN:  Await pathology results.                                               Bernette Redbird, M.D.    RB/MEDQ  D:  05/09/2003  T:  05/09/2003  Job:  [id number]   cc:   Mosetta Putt, M.D.  [address] [place]  [place] [address]  Fax: [phone]

## 2010-11-14 NOTE — Op Note (Signed)
NAMERANA, ADORNO               ACCOUNT NO.:  [id number]   MEDICAL RECORD NO.:  [id number]          PATIENT TYPE:  AMB   LOCATION:  DSC                          FACILITY:  [facility]   PHYSICIAN:  Cindee Salt, M.D.       DATE OF BIRTH:  01/08/1946   DATE OF PROCEDURE:  11/12/2004  DATE OF DISCHARGE:                                 OPERATIVE REPORT   PREOPERATIVE DIAGNOSIS:  Carpal tunnel syndrome bilaterally.   POSTOPERATIVE DIAGNOSIS:  Carpal tunnel syndrome bilaterally.   OPERATION:  Decompression of left median nerve with injection of right  carpal tunnel.   SURGEON:  Cindee Salt, M.D.   ASSISTANT:  Carolyne Fiscal, R.N.   ANESTHESIA:  IV regional and general.   HISTORY:  The patient is a 65 year old male with a history of carpal tunnel  syndrome EMG nerve conductions positive. This has not responded to  conservative treatment. He is admitted for carpal tunnel release on his left  side and has requested injection to his right carpal canal.   PROCEDURE:  The patient is brought to the operating room where a forearm  based IV regional anesthetic was carried out without difficulty.  He was  prepped using DuraPrep, supine position, left arm free. Significant  circulation was still present due to his fluctuation of blood pressure.  It  was decided proceed with a general anesthetic in that he continued to have  feeling on his left side.  A general anesthetic was then given.  The  tourniquet was rewrapped, inflated to 300 mmHg, a longitudinal incision was  made in the palm, carried down through subcutaneous tissue. Bleeders were  electrocauterized on the left side. The dissection was carried down to the  palmar fascia which was split, superficial palmar arch was identified. The  flexor tendon to the ring and little finger identified to the ulnar side of  the median nerve.  The carpal retinaculum was incised with sharp dissection.  A right angle and Sewell retractor placed between skin and  forearm fascia.  The fascia was released for approximately a centimeter and a half proximal  to the wrist crease under direct vision, was found to be extremely tight.  Tenosynovial tissue was markedly thickened. No further lesions were  identified. The wound was irrigated. Skin was closed with interrupted 5-0  nylon sutures.  Injection was then carried out to the right carpal canal  using Depo-Medrol Xylocaine.  This was performed just to the radial aspect  of the hamate hook.  Prepped with Betadine. The injection was given without  difficulty. The patient tolerated the procedure well, was taken to the  recovery room for observation in satisfactory condition. He is discharged  home to return to the [facility] in one week on Vicodin.      GK/MEDQ  D:  11/12/2004  T:  11/12/2004  Job:  [id number]

## 2010-11-14 NOTE — Op Note (Signed)
[place]  Patient:    ROCKWELL, ZENTZ                        MRN: [id number] Proc. Date: 11/27/99 Attending:  Reynolds Bowl, M.D.                           Operative Report  PREOPERATIVE DIAGNOSIS:  Left shoulder partial tear of rotator cuff and impingement.  POSTOPERATIVE DIAGNOSIS:  Anterior labral tear, undersurface tear of supraspinatus, and type 3 acromion.  OPERATIVE PROCEDURE:  Arthroscopy, left shoulder, with debridement of glenoid labral tear and undersurface rotator cuff tear, followed by acromioplasty and debridement of subacromial bursa.  ANESTHESIA:  General.  SURGEON:  Reynolds Bowl, M.D.  ASSISTANT:  Nadara Mustard, M.D.  DESCRIPTION:  Patient was placed in the operative position of partial sitting, given a general anesthetic by endotracheal tube.  Axillary area isolated with U drape and then extremity prepped to the drape.  The viewing portal was established posteriorly, then under direct vision anterior portal established, and the glenohumeral joint examined.  Found there was degenerative ragged tear of most of the anterior labrum.  This was debrided.  We then found undersurface tear of the rotator cuff.  This was debrided.  The area was irrigated and attention directed to the subacromial space, where there was a large downturn of the acromion and a lot of bursal tissue.  The bursal tissue was debrided back as required to visualize.  This was done partially with the ______ Bovie and rotating meniscotome.  We then exposed the leading edge of the acromion which was turned down considerably, and I used a stone cutter to resect this very prominent acromial edge and ended up making the area wide open and the acromion fairly smooth.  Having done that, I went back and checked all punctate areas of venous bleeding, and Bovied this.  Then irrigated a little while longer, removed the instruments, installed the ______ tubing, closed  the portals with 4-0 nylon and applied a dressing.  Sent the patient home with instructions to take Vicodin for pain, use the hand and upper extremity gently, frequently make a fist, rotate forearm, move shoulder as tolerated.  Remove dressing and ______ at 36 hours and replace dressing with Band-Aid.  Call me with concerns.  See me in the office as planned. DD:  11/27/99 TD:  11/27/99 Job: [id number]

## 2010-11-14 NOTE — Op Note (Signed)
[facility]  Patient:    [name], [name] Visit Number: [id number] MRN: [id number]          Service Type: Attending:  Lucrezia Starch. Ovidio Hanger, M.D. Dictated by:   Lucrezia Starch. Ovidio Hanger, M.D. Proc. Date: 09/27/01                             Operative Report  DIAGNOSIS:  Benign prostatic hypertrophy with significant voiding symptoms.  OPERATIVE PROCEDURE:  Transurethral needle ablation of the prostate (TUNA).  SURGEON:  Lucrezia Starch. Ovidio Hanger, M.D.  ANESTHESIA:  MAC plus local.  TUBES:  18-French Foley catheter.  COMPLICATIONS:  None.  INDICATION FOR PROCEDURE:  [name] is a very nice 65 year old white male who presents with progressive urinary symptoms including decreased force of stream, intermittency, hesitancy, progressive over five to six years despite maximum dose Flomax. He has considered all options and elected to proceed with a TUNA procedure. He has been properly worked up and properly informed.  PROCEDURE IN DETAIL:  The patient was placed in supine position. After proper MAC analgesia, he was placed in the dorsal lithotomy position. Local anesthetic was placed intraurethrally and cystourethroscopy was performed with the TUNA device. TUNA procedure was then performed. Sticks were performed in the 6 oclock position 0.5 cm distal to the bladder neck at 12 mm, right and left sticks were performed in the 3 oclock and 9 oclock position 1 cm from the bladder neck at 14 mm, mid prostate sticks were at 16 mm and apical sticks were at 14 mm each, all in the 3 and 9 position. Each urethral temperature was maintained appropriately and each was cycled appropriately to 110 degrees Centigrade for four minutes. Following this, there was no significant bleeding. The device was removed visually. An 18-French catheter was inserted and the bladder was noted to drain clear. The patient tolerated the procedure well with no complications. He was taken to the  recovery room stable. Dictated by:   Lucrezia Starch. Ovidio Hanger, M.D. Attending:  Lucrezia Starch. Ovidio Hanger, M.D. DD:  09/27/01 TD:  09/28/01 Job: [id number]

## 2010-11-14 NOTE — Op Note (Signed)
   [name], [name]                           ACCOUNT NO.:  [id number]   MEDICAL RECORD NO.:  [id number]                   PATIENT TYPE:  AMB   LOCATION:  ENDO                                 FACILITY:  [facility]   PHYSICIAN:  Bernette Redbird, M.D.                DATE OF BIRTH:  1946/02/07   DATE OF PROCEDURE:  05/09/2003  DATE OF DISCHARGE:                                 OPERATIVE REPORT   PROCEDURE:  Upper endoscopy.   ENDOSCOPIST:  Bernette Redbird, M.D.   INDICATIONS:  A 65 year old with longstanding reflux symptoms.   FINDINGS:  Small hiatal hernia.   DESCRIPTION OF PROCEDURE:  The nature, purpose and risks of this procedure  had been discussed with the patient who provided written consent.  Sedation  was fentanyl 62.5 mcg and Versed 5 mg IV for this procedure.  No problem  with arrhythmias or desaturation.   The Olympus video endoscope was passed under direct vision.  The larynx  looked normal and the esophagus was readily entered.  No free reflux was  observed and there was no evidence of reflux esophagitis, Barrett's  esophagitis, varices, infection, neoplasia, or any ring or stricture.  There  was a 2 cm hiatal hernia which seemed to have reduced spontaneously to a  large degree during the course of the procedure.   The stomach contained no significant residual and had normal mucosa without  evidence of gastritis, erosions, ulcers, polyps, or masses including a  retroflex view of the proximal stomach.  The pylorus, duodenal bulb and  second duodenum looked normal.   The scope was removed from the patient.  No biopsies were obtained.   He tolerated the procedure well and there were no apparent complications.   IMPRESSION:  Essentially normal upper endoscopy without evidence of adverse  sequelae of longstanding reflux symptoms.    PLAN:  The patient has had substantial improvement from a trial of Nexium  (he has been able to use just one every other day and get good  control of  symptoms).  I recommended he consider switching to over-the-counter Prilosec  to reduce cost.  No followup endoscopy required.                                               Bernette Redbird, M.D.    RB/MEDQ  D:  05/09/2003  T:  05/09/2003  Job:  [id number]   cc:   Mosetta Putt, M.D.  [address] [place]  [place] [address]  Fax: [phone]

## 2010-12-08 ENCOUNTER — Encounter (HOSPITAL_BASED_OUTPATIENT_CLINIC_OR_DEPARTMENT_OTHER)
Admission: RE | Admit: 2010-12-08 | Discharge: 2010-12-08 | Disposition: A | Discharge status: Home / Self Care | Payer: BC Managed Care – PPO | Source: Ambulatory Visit | Attending: Orthopedic Surgery | Admitting: Orthopedic Surgery

## 2010-12-08 LAB — BASIC METABOLIC PANEL
Calcium: 9.5 mg/dL (ref 8.4–10.5)
Creatinine, Ser: 0.85 mg/dL (ref 0.4–1.5)
GFR calc Af Amer: >60 mL/min (ref >60)
Sodium: 136 mEq/L (ref 135–145)

## 2010-12-10 ENCOUNTER — Ambulatory Visit (HOSPITAL_BASED_OUTPATIENT_CLINIC_OR_DEPARTMENT_OTHER)
Admission: RE | Admit: 2010-12-10 | Discharge: 2010-12-10 | Disposition: A | Discharge status: Home / Self Care | Payer: BC Managed Care – PPO | Source: Ambulatory Visit | Attending: Orthopedic Surgery | Admitting: Orthopedic Surgery

## 2010-12-10 DIAGNOSIS — M129 Arthropathy, unspecified: Secondary | ICD-10-CM | POA: Insufficient documentation

## 2010-12-10 DIAGNOSIS — I10 Essential (primary) hypertension: Secondary | ICD-10-CM | POA: Insufficient documentation

## 2010-12-10 DIAGNOSIS — Z01812 Encounter for preprocedural laboratory examination: Secondary | ICD-10-CM | POA: Insufficient documentation

## 2010-12-10 DIAGNOSIS — K219 Gastro-esophageal reflux disease without esophagitis: Secondary | ICD-10-CM | POA: Insufficient documentation

## 2010-12-10 DIAGNOSIS — M249 Joint derangement, unspecified: Secondary | ICD-10-CM | POA: Insufficient documentation

## 2010-12-10 DIAGNOSIS — Z87891 Personal history of nicotine dependence: Secondary | ICD-10-CM | POA: Insufficient documentation

## 2010-12-10 LAB — POCT HEMOGLOBIN-HEMACUE: Hemoglobin: 13.3 g/dL (ref 13.0–17.0)

## 2011-01-16 NOTE — Op Note (Addendum)
[name], [name]               ACCOUNT NO.:  [id number]  MEDICAL RECORD NO.:  [id number]  LOCATION:  DSC                          FACILITY:  [facility]  PHYSICIAN:  Cindee Salt, M.D.       DATE OF BIRTH:  1945/09/27  DATE OF PROCEDURE:  12/10/2010 DATE OF DISCHARGE:  06/10/2010                              OPERATIVE REPORT   PREOPERATIVE DIAGNOSIS:  Rupture flexor carpi radialis tendon, right wrist.  POSTOPERATIVE DIAGNOSIS:  Rupture flexor carpi radialis tendon, right wrist.  OPERATION:  Repair of flexor carpi radialis with attachment to distal radius, right wrist.  SURGEON:  Cindee Salt, MD  ANESTHESIA:  Axillary general.  DATE OF OPERATION:  December 10, 2010.  ANESTHESIOLOGIST:  Dr. Gelene Mink.  HISTORY:  The patient is a 65 year old male with history of wrist pain, subsequent rupture of the flexor carpi radialis tendon with a large mass in the volar aspect where the muscle has contracted.  He is having significant pain and had areas desirous of having something done to try to correct this.  We have discussed reattachment repair, reconstruction of the tendon, simply tenodesis in an effort to prevent proximal pain with a significant palpable defect.  MRI reveals a rupture.  He has elected to simply have this repaired to the radius rather than attempted full reconstruction.  He is aware of risks and complications including infection, recurrence injury to arteries, nerves, tendons, incomplete relief of symptoms, dystrophy.  In preoperative area, the patient was seen.  The extremity was marked by both the patient and surgeon. Antibiotic given.  PROCEDURE:  The patient was brought to the operating room where a supraclavicular block was carried out without difficulty, was prepped using ChloraPrep, supine position, right arm free.  A 3-minute dry time was allowed.  Time-out taken confirming the patient and procedure.  The limb was exsanguinated with an Esmarch bandage.   Tourniquet placed high and the arm was inflated to 250 mmHg.  Straight incision was made over the flexor carpi radialis tendon carried down through subcutaneous tissue.  Bleeders were electrocauterized with bipolar.  The termination of the tendon was identified with the pseudo tendon present.  The radial artery was identified and protected.  The dissection carried down distal to the flexor pollicis longus tendon muscle belly.  The pronator quadratus was incised on its most radial aspect.  This was done proximally.  The flexor carpi radialis tendon was then placed under traction relieving the myostatic contracture of all.  This brought the area of rupture out to the level of the distal scaphoid.  The area was then transected.  A JuggerKnot was then placed into the distal radius just distal to the origin of the flexor pollicis longus tendon.  The area of the bone roughened and the tendon stump was then sutured down maintaining full length to the flexor carpi radialis.  This was then sutured into position firmly fixing it to radius distally.  The woundwas copiously irrigated with saline.  The subcutaneous tissue was closed with interrupted 4-0 Vicryl and the skin with a subcuticular 4-0 Vicryl Rapide sutures.  Sterile compressive dressing, splint to the wrist was applied for comfort.  The patient tolerated  the procedure well and deflation of the tourniquet, all fingers were immediately pinked.  He was taken to recovery room for observation in satisfactory condition.  He will be discharged home to return to [facility] [place] in 1 week, on Vicodin.          ______________________________ Cindee Salt, M.D.     GK/MEDQ  D:  12/10/2010  T:  12/11/2010  Job:  [id number]  Electronically Signed by Cindee Salt M.D. on 01/16/2011 08:57:53 AM

## 2011-02-19 ENCOUNTER — Encounter (INDEPENDENT_AMBULATORY_CARE_PROVIDER_SITE_OTHER): Payer: Self-pay | Admitting: General Surgery

## 2011-02-19 ENCOUNTER — Ambulatory Visit (INDEPENDENT_AMBULATORY_CARE_PROVIDER_SITE_OTHER): Payer: BC Managed Care – PPO | Admitting: General Surgery

## 2011-02-19 VITALS — BP 138/86 | HR 68 | Temp 97.1°F | Ht 68.0 in | Wt 210.4 lb

## 2011-02-19 DIAGNOSIS — K645 Perianal venous thrombosis: Secondary | ICD-10-CM

## 2011-02-19 DIAGNOSIS — K648 Other hemorrhoids: Secondary | ICD-10-CM

## 2011-02-19 NOTE — Patient Instructions (Signed)
Start warm soaks this evening and apply the lidocaine ointment to your anus on gauze after soaking and p.r.n. for pain. Limits her activity no heavy lifting and avoid constipation definite for the next 2 day hopefully to prevent flow distally. I would recommend MiraLax daily starting tomorrow morning and you can use the Vicodin for pain if you are having problems with significant bleeding he would need to go to the [facility] emergency room and had a   my partner on call to notify. 3 we'll not have problems with bleeding and I would like to see you in followup in approximately 2-3 weeks he may need a other little office procedures. He should not drive operate machinery if you're taking Vicodin for pain

## 2011-02-19 NOTE — Progress Notes (Signed)
Subjective:     Patient ID: [name], male   DOB: 01/12/1946, 65 y.o.   MRN: [id number]  HPIPatient referred to our office today by Dr. Matthias Hughs for bleeding hemorrhoids with the following history. The patient was referred to Dr. Jenna Luo by Dr. Duaine Dredge for [employer] rectal bleeding he had an upper endoscopy and colonoscopy with no significant colonic time in. The patient had a hard bowel movement earlier today and is afterwards started bleeding he had an appointment with Dr. Matthias Hughs this morning in he said he needed to be seen by a surgeon and he was added to the urgent office the patient has had he says significant bleeding today he's also had a lot of anal discomfort and on examination he has a large ischemic probably internal prolapsed hemorrhoid that is bleeding and had some mild or external hemorrhoids or the right side his colonoscopy report was reviewed and it did not noted that he had significant hemorrhoids at that time 6 months earlier.  I recommended to the patient that we try to see if we could excise his bleeding void that's really not a definite and external humeral and he was in agreement was to proceed and understands he may have some problems with bleeding post procedure I anesthetized the splenic area and the hemorrhoid wouldn't prolapse and then I basically removed the hemorrhoid and tried to suture the pedicle with a figure-of-eight of 3-0 chromic. The little hemorrhoids on the opposite side a left alone and then when I reexamined the patient 30 minutes later he said his pain was ago he did not. He have any problems with bleeding. He will go home get office they and understands that if he is having follow problems or bleeding to go to the emergency room at [facility]   Review of Systems   Objective:   Physical ExamExamination Limited predominantly to the anus he has prolapsed internal hemorrhoid that split in and had some early external thrombosed hemorrhoid on the right side I  proceeded to excise this large lesion hemorrhoid and sutured the base and he appears not to have any follow bleeding he was given Vicodin for pain lidocaine ointment and instructed to go home he is off his feet at this time. No strenuous activities for the next several days and I'll plan on root reevaluating him in approximately 2-3 weeks     Assessment:   Elapsed internal lead and hemorrhoids and small thrombosed external right humeral he may ultimately a formal internal and external hemorrhoidectomy but he's never had a similar problem like this and will try conservative management first    Plan:     See above note

## 2011-02-21 ENCOUNTER — Emergency Department (HOSPITAL_COMMUNITY)
Admission: EM | Admit: 2011-02-21 | Discharge: 2011-02-21 | Disposition: A | Discharge status: Home / Self Care | Payer: BC Managed Care – PPO | Attending: Emergency Medicine | Admitting: Emergency Medicine

## 2011-02-21 ENCOUNTER — Encounter (INDEPENDENT_AMBULATORY_CARE_PROVIDER_SITE_OTHER): Payer: Self-pay | Admitting: Surgery

## 2011-02-21 DIAGNOSIS — E7800 Pure hypercholesterolemia, unspecified: Secondary | ICD-10-CM | POA: Insufficient documentation

## 2011-02-21 DIAGNOSIS — K644 Residual hemorrhoidal skin tags: Secondary | ICD-10-CM | POA: Insufficient documentation

## 2011-02-21 DIAGNOSIS — K6289 Other specified diseases of anus and rectum: Secondary | ICD-10-CM | POA: Insufficient documentation

## 2011-02-21 DIAGNOSIS — I10 Essential (primary) hypertension: Secondary | ICD-10-CM | POA: Insufficient documentation

## 2011-02-21 NOTE — Progress Notes (Signed)
Pt seen in [facility] ER after calling on call physician.  Complains of pain at site of procedure by Dr. Zachery Dakins 8/23.  Exam:  Prolapsed edematous tissue left anal verge with intact chromic gut sutures.  No bleeding.  No thrombus.  Imp:  Edema, inflammation after hemorrhoidectomy  Plan:  Rx given for Analpram 2.5% HC cream and Percocet tabs.  Follow up with Dr. Zachery Dakins.  Sitz baths and ice packs.  Baby wipes.  Velora Heckler, MD, FACS General & Endocrine Surgery [facility]

## 2011-02-23 ENCOUNTER — Telehealth (INDEPENDENT_AMBULATORY_CARE_PROVIDER_SITE_OTHER): Payer: Self-pay

## 2011-02-23 NOTE — Telephone Encounter (Signed)
Follow up call to [name]-- patient called into our office over the weekend and spoke to Dr. Gerrit Friends c/o continuos hemorrhoid pain.  Patient states he has not had any relief with medication & advice given. Scheduled [name] for 02/23/11 in our urgent office

## 2011-03-03 ENCOUNTER — Encounter (INDEPENDENT_AMBULATORY_CARE_PROVIDER_SITE_OTHER): Payer: BC Managed Care – PPO | Admitting: Surgery

## 2011-03-06 NOTE — Telephone Encounter (Signed)
Patient called over weekend and spoke with Dr. Gerrit Friends re: continuous rectal pain from hemorrhoids, called patient with follow up appointment.

## 2011-03-10 ENCOUNTER — Ambulatory Visit (INDEPENDENT_AMBULATORY_CARE_PROVIDER_SITE_OTHER): Payer: Self-pay | Admitting: General Surgery

## 2011-03-18 ENCOUNTER — Ambulatory Visit (INDEPENDENT_AMBULATORY_CARE_PROVIDER_SITE_OTHER): Payer: BC Managed Care – PPO | Admitting: General Surgery

## 2011-03-19 ENCOUNTER — Encounter (INDEPENDENT_AMBULATORY_CARE_PROVIDER_SITE_OTHER): Payer: Self-pay | Admitting: General Surgery

## 2011-03-20 ENCOUNTER — Ambulatory Visit (INDEPENDENT_AMBULATORY_CARE_PROVIDER_SITE_OTHER): Payer: Self-pay | Admitting: General Surgery

## 2011-04-06 ENCOUNTER — Encounter (INDEPENDENT_AMBULATORY_CARE_PROVIDER_SITE_OTHER): Payer: Self-pay | Admitting: Surgery

## 2011-04-06 ENCOUNTER — Encounter (INDEPENDENT_AMBULATORY_CARE_PROVIDER_SITE_OTHER): Payer: Self-pay | Admitting: General Surgery

## 2011-04-16 LAB — BASIC METABOLIC PANEL WITH GFR
BUN: 17
CO2: 28
Calcium: 9.4
Chloride: 101
Creatinine, Ser: 1.05
GFR calc non Af Amer: >60
Glucose, Bld: 114 — ABNORMAL HIGH
Potassium: 3.6
Sodium: 137

## 2011-04-16 LAB — CBC
HCT: 38.4 — ABNORMAL LOW
HCT: 41.3
Hemoglobin: 12.9 — ABNORMAL LOW
MCHC: 33.7
MCHC: 33.8
MCHC: 34.1
MCV: 86.2
MCV: 86.2
MCV: 86.8
Platelets: 222
Platelets: 229
Platelets: 234
RBC: 4.94
RDW: 13.2
RDW: 13.3

## 2011-04-16 LAB — TROPONIN I
Troponin I: 0.03
Troponin I: 0.07 — ABNORMAL HIGH

## 2011-04-16 LAB — COMPREHENSIVE METABOLIC PANEL
Albumin: 3.2 — ABNORMAL LOW
Alkaline Phosphatase: 46
BUN: 15
CO2: 30
Chloride: 104
GFR calc non Af Amer: >60
Potassium: 3.7
Total Bilirubin: 0.6

## 2011-04-16 LAB — CARDIAC PANEL(CRET KIN+CKTOT+MB+TROPI)
Total CK: 148
Troponin I: <0.01

## 2011-04-16 LAB — CK TOTAL AND CKMB (NOT AT ARMC)
Relative Index: 1.3
Total CK: 120

## 2011-04-16 LAB — HEPATIC FUNCTION PANEL
ALT: 30
Alkaline Phosphatase: 47
Bilirubin, Direct: 0.2
Indirect Bilirubin: 0.4
Total Bilirubin: 0.6
Total Protein: 7.2

## 2011-04-16 LAB — RETICULOCYTES: Retic Ct Pct: 2.1

## 2012-01-26 ENCOUNTER — Other Ambulatory Visit (HOSPITAL_COMMUNITY): Payer: Self-pay | Admitting: Radiology

## 2012-01-26 DIAGNOSIS — R011 Cardiac murmur, unspecified: Secondary | ICD-10-CM

## 2012-01-27 ENCOUNTER — Ambulatory Visit (HOSPITAL_COMMUNITY): Payer: BC Managed Care – PPO | Attending: Cardiovascular Disease | Admitting: Radiology

## 2012-01-27 DIAGNOSIS — I10 Essential (primary) hypertension: Secondary | ICD-10-CM | POA: Insufficient documentation

## 2012-01-27 DIAGNOSIS — R011 Cardiac murmur, unspecified: Secondary | ICD-10-CM

## 2012-01-27 DIAGNOSIS — I2510 Atherosclerotic heart disease of native coronary artery without angina pectoris: Secondary | ICD-10-CM | POA: Insufficient documentation

## 2012-01-27 NOTE — Progress Notes (Signed)
Echocardiogram performed.  

## 2012-01-28 ENCOUNTER — Encounter (HOSPITAL_COMMUNITY): Payer: Self-pay | Admitting: Family Medicine

## 2012-12-21 ENCOUNTER — Other Ambulatory Visit: Payer: Self-pay | Admitting: Dermatology

## 2012-12-26 ENCOUNTER — Ambulatory Visit (HOSPITAL_BASED_OUTPATIENT_CLINIC_OR_DEPARTMENT_OTHER): Payer: BC Managed Care – PPO

## 2012-12-27 ENCOUNTER — Ambulatory Visit (HOSPITAL_BASED_OUTPATIENT_CLINIC_OR_DEPARTMENT_OTHER): Payer: BC Managed Care – PPO | Attending: Family Medicine

## 2012-12-27 VITALS — Ht 68.0 in | Wt 205.0 lb

## 2012-12-27 DIAGNOSIS — R0989 Other specified symptoms and signs involving the circulatory and respiratory systems: Secondary | ICD-10-CM | POA: Insufficient documentation

## 2012-12-27 DIAGNOSIS — G4733 Obstructive sleep apnea (adult) (pediatric): Secondary | ICD-10-CM

## 2012-12-27 DIAGNOSIS — R0609 Other forms of dyspnea: Secondary | ICD-10-CM | POA: Insufficient documentation

## 2012-12-27 HISTORY — PX: ROTATOR CUFF REPAIR: SHX139

## 2012-12-31 DIAGNOSIS — R0609 Other forms of dyspnea: Secondary | ICD-10-CM

## 2012-12-31 DIAGNOSIS — R0989 Other specified symptoms and signs involving the circulatory and respiratory systems: Secondary | ICD-10-CM

## 2012-12-31 DIAGNOSIS — G4733 Obstructive sleep apnea (adult) (pediatric): Secondary | ICD-10-CM

## 2013-01-01 NOTE — Procedures (Signed)
[name], [name]               ACCOUNT NO.:  [id number]  MEDICAL RECORD NO.:  [id number]          PATIENT TYPE:  OUT  LOCATION:  SLEEP CENTER                 FACILITY:  [facility]  PHYSICIAN:  Clinton D. Maple Hudson, MD, FCCP, FACPDATE OF BIRTH:  Sep 10, 1945  DATE OF STUDY:  12/27/2012                           NOCTURNAL POLYSOMNOGRAM  REFERRING PHYSICIAN:  Mosetta Putt, M.D.  INDICATION FOR STUDY:  Hypersomnia with sleep apnea.  EPWORTH SLEEPINESS SCORE:  11/24.  BMI 31.2, weight 205 pounds, height 68 inches, neck 17.5 inches.  MEDICATIONS:  Home medications are charted for review.  SLEEP ARCHITECTURE:  Total sleep time 353 minutes with sleep efficiency 84.9%.  Stage I was 14.7%, stage II 69.7%, stage III absent, REM 15.6% of total sleep.  Sleep latency 9.5 minutes, REM latency 58.5 minutes, the awake after sleep onset 53.5 minutes.  Arousal index 22.6.  BEDTIME MEDICATION:  None.  RESPIRATORY DATA:  Apnea-hypopnea index (AHI) 21.6 per hour.  A total of 127 events was scored including 23 obstructive apneas, 6 central apneas, 7 mixed apneas, 91 hypopneas.  Events were not positional.  REM AHI 41.5 per hour.  This is a diagnostic NPSG protocol as ordered, without CPAP titration.  OXYGEN DATA:  Moderately loud snoring with oxygen desaturation to a nadir of 81% and mean oxygen saturation through the study of 92.1% on room air.  CARDIAC DATA:  Sinus rhythm with PVCs.  MOVEMENT-PARASOMNIA:  A total of 93 limb jerks were counted, of which, 5 were associated with arousals or awakenings for periodic limb movements with arousal index of 0.8 per hour. Bathroom x2.  IMPRESSIONS-RECOMMENDATIONS: 1. Moderate obstructive sleep apnea/hypopnea syndrome, AHI 21.6 per     hour with non-positional events.  REM AHI 41.5 per hour.     Moderately loud snoring with oxygen desaturation to a nadir of 81%     and mean oxygen saturation through the study of 92.1% on room air. 2. This study was ordered  as a diagnostic NPSG protocol.  Consider     return for dedicated CPAP titration if appropriate.     Clinton D. Maple Hudson, MD, [facility], FACP Diplomate, American Board of Sleep Medicine    CDY/MEDQ  D:  12/31/2012 10:13:54  T:  01/01/2013 01:09:12  Job:  [id number]

## 2013-01-06 ENCOUNTER — Encounter: Payer: Self-pay | Admitting: *Deleted

## 2013-01-09 ENCOUNTER — Encounter: Payer: Self-pay | Admitting: Internal Medicine

## 2013-01-09 ENCOUNTER — Ambulatory Visit (INDEPENDENT_AMBULATORY_CARE_PROVIDER_SITE_OTHER): Payer: BC Managed Care – PPO | Admitting: Internal Medicine

## 2013-01-09 VITALS — BP 128/86 | HR 98 | Ht 68.0 in | Wt 206.0 lb

## 2013-01-09 DIAGNOSIS — Z01810 Encounter for preprocedural cardiovascular examination: Secondary | ICD-10-CM

## 2013-01-09 DIAGNOSIS — I2510 Atherosclerotic heart disease of native coronary artery without angina pectoris: Secondary | ICD-10-CM

## 2013-01-09 DIAGNOSIS — I498 Other specified cardiac arrhythmias: Secondary | ICD-10-CM

## 2013-01-09 DIAGNOSIS — R000 Tachycardia, unspecified: Secondary | ICD-10-CM

## 2013-01-09 DIAGNOSIS — R9431 Abnormal electrocardiogram [ECG] [EKG]: Secondary | ICD-10-CM

## 2013-01-09 NOTE — Assessment & Plan Note (Signed)
I am concerned by his sinus tachycardia-relative. His resting rate is 98.will check with PCP re Hgb and TSH

## 2013-01-09 NOTE — Assessment & Plan Note (Signed)
Stable. He is on aspirin. We will get his lipids from Dr. Turner Daniels

## 2013-01-09 NOTE — Patient Instructions (Addendum)
Your physician has requested that you have an echocardiogram. Echocardiography is a painless test that uses sound waves to create images of your heart. It provides your doctor with information about the size and shape of your heart and how well your heart's chambers and valves are working. This procedure takes approximately one hour. There are no restrictions for this procedure.   Your physician wants you to follow-up in: ONE YEAR WITH DR Logan Bores will receive a reminder letter in the mail two months in advance. If you don't receive a letter, please call our office to schedule the follow-up appointment.

## 2013-01-09 NOTE — Progress Notes (Signed)
ELECTROPHYSIOLOGY CONSULT NOTE  Patient ID: [name], MRN: [id number], DOB/AGE: 1945-07-12 67 y.o. Admit date: (Not on file) Date of Consult: 01/09/2013  Primary Physician: Carolyne Fiscal, MD Primary Cardiologist:    Chief Complaint: preoperative clearance shoulder surgery    HPI [name] is a 67 y.o. male  Seen for preoperative clearance   I had seen him in 2008 with complaints of near syncope. 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  There was also evidence in 2010 of sleep apnea. He finally underwent sleep testing July 2014 demonstrating moderate sleep apnea with an AHI of 21    He is a history of a catheterization in 2008 demonstrate severe LAD disease and moderate circumflex disease with an occluded posterolateral branch. He had normal LV function with perfusion abnormality on Myoview scan is wanting to that distribution.  He underwent DES stenting  2013 he underwent echocardiogram ordered by his PCP. This demonstrated normal left ventricular function, mild left ventricular hypertrophy  He has had no significant problems with exercise tolerance. He is able to climb stairs. He has no edema.  His wife died about a year ago. She had colon cancer. He is still working.       Past Medical History  Diagnosis Date  . Colon polyp   . Hyperlipidemia   . Wears glasses   . Hearing loss     wears hearing aid  . Esophageal reflux   . FHx: colonic polyps 11/04, 01/2009  . Fe deficiency anemia 2012  . Heart attack   . CAD (coronary artery disease)   . Hypertension   . Gastritis   . Hypogonadism male   . BPH (benign prostatic hyperplasia)   . Hemorrhoids, internal   . Anemia       Surgical History:  Past Surgical History  Procedure Laterality Date  . Hand surgery      left 2010, right 2012  . Knee surgery  1994    left  . Coronary angioplasty with stent placement    .  Left shoulder    . Appendectomy    . Left knee      x3  . Vasectomy    . Prostate surgery    . Right cts      ?     Home Meds: Prior to Admission medications   Medication Sig Start Date End Date Taking? Authorizing Provider  aspirin 81 MG tablet Take 81 mg by mouth daily.     Yes Historical Provider, MD  atorvastatin (LIPITOR) 80 MG tablet Take 80 mg by mouth daily.     Yes Historical Provider, MD  chlorthalidone (HYGROTON) 25 MG tablet Take 25 mg by mouth daily.     Yes Historical Provider, MD  lisinopril (PRINIVIL,ZESTRIL) 40 MG tablet Take 40 mg by mouth daily.     Yes Historical Provider, MD  Multiple Vitamins-Minerals (MULTIVITAMIN WITH MINERALS) tablet Take 1 tablet by mouth daily.     Yes Historical Provider, MD  omeprazole (PRILOSEC) 20 MG capsule Take 20 mg by mouth daily.     Yes Historical Provider, MD  potassium chloride (KLOR-CON) 10 MEQ CR tablet Take 10 mEq by mouth daily.     Yes Historical Provider, MD  rosuvastatin (CRESTOR) 10 MG tablet Take 40 mg by mouth daily.   Yes Historical Provider, MD      Allergies:  Allergies  Allergen Reactions  . Clindamycin Hcl   . Metoprolol  Succinate     History   Social History  . Marital Status: Married    Spouse Name: N/A    Number of Children: N/A  . Years of Education: N/A   Occupational History  . Not on file.   Social History Main Topics  . Smoking status: Never Smoker   . Smokeless tobacco: Not on file  . Alcohol Use: Yes     Comment: 3 or 4 beers per week  . Drug Use: No  . Sexually Active:    Other Topics Concern  . Not on file   Social History Narrative   ** Merged History Encounter **         Family History  Problem Relation Age of Onset  . Heart disease Mother   . Cancer Father   . Other Sister     brain tumor  . Stroke Brother   . Cancer Sister   . Colon polyps    . Liver disease       ROS:  Please see the history of present illness.     All other systems reviewed and negative.     Physical Exam:   Blood pressure 128/86, pulse 98, height 5\' 8"  (1.727 m), weight 206 lb (93.441 kg). General: Well developed, well nourished male in no acute distress. Head: Normocephalic, atraumatic, sclera non-icteric, no xanthomas, nares are without discharge. EENT: normal Lymph Nodes:  none Back: without scoliosis/kyphosis , no CVA tendersness Neck: Negative for carotid bruits. JVD not elevated. Lungs: Clear bilaterally to auscultation without wheezes, rales, or rhonchi. Breathing is unlabored. Heart: RRR with S1 S2. No  murmur , rubs, or gallops appreciated. Abdomen: Soft, non-tender, non-distended with normoactive bowel sounds. No hepatomegaly. No rebound/guarding. No obvious abdominal masses. Msk:  Strength and tone appear normal for age. Extremities: No clubbing or cyanosis. No edema.  Distal pedal pulses are 2+ and equal bilaterally. Skin: Warm and Dry Neuro: Alert and oriented X 3. CN III-XII intact Grossly normal sensory and motor function . Psych:  Responds to questions appropriately with a normal affect.      Labs: Cardiac Enzymes No results found for this basename: CKTOTAL, CKMB, TROPONINI,  in the last 72 hours CBC Lab Results  Component Value Date   WBC 5.5 01/12/2007   HGB 13.3 12/10/2010   HCT 40.3 01/12/2007   MCV 86.4 01/12/2007   PLT 252 01/12/2007   PROTIME: No results found for this basename: LABPROT, INR,  in the last 72 hours Chemistry No results found for this basename: NA, K, CL, CO2, BUN, CREATININE, CALCIUM, LABALBU, PROT, BILITOT, ALKPHOS, ALT, AST, GLUCOSE,  in the last 168 hours Lipids Lab Results  Component Value Date   CHOL 191 12/23/2006   HDL 34.0* 12/23/2006   TRIG 269* 12/23/2006   BNP No results found for this basename: probnp   Miscellaneous No results found for this basename: DDIMER    Radiology/Studies:  No results found.  EKG: sinus rhythm at 98 Intervals 19/10/37 Q waves are evident 23 and F. Which are old  of Q waves are  also evident now V3-V6. Compared 2010 we were not present in R wave progression was more normal.   Assessment and Plan:    Sherryl Manges

## 2013-01-09 NOTE — Assessment & Plan Note (Signed)
The patient's functional status is quite good. I think he should be at low risk for surgery. I am struck however by the   change in electrocardiogram are suggestive of anterolateral infarction. This was not evident on echocardiogram one year ago; his ECG however 4 years ago did not show these changes. Hence, I have suggested that we undertake an echo mostly to risk stratify and make sure there is not been intercurrent major event with which he has had no symptoms

## 2013-01-10 ENCOUNTER — Ambulatory Visit (HOSPITAL_COMMUNITY): Payer: BC Managed Care – PPO | Attending: Cardiovascular Disease | Admitting: Radiology

## 2013-01-10 DIAGNOSIS — R9431 Abnormal electrocardiogram [ECG] [EKG]: Secondary | ICD-10-CM

## 2013-01-10 DIAGNOSIS — Z01810 Encounter for preprocedural cardiovascular examination: Secondary | ICD-10-CM | POA: Insufficient documentation

## 2013-01-10 DIAGNOSIS — R000 Tachycardia, unspecified: Secondary | ICD-10-CM

## 2013-01-10 DIAGNOSIS — E669 Obesity, unspecified: Secondary | ICD-10-CM | POA: Insufficient documentation

## 2013-01-10 DIAGNOSIS — I2510 Atherosclerotic heart disease of native coronary artery without angina pectoris: Secondary | ICD-10-CM | POA: Insufficient documentation

## 2013-01-10 DIAGNOSIS — E785 Hyperlipidemia, unspecified: Secondary | ICD-10-CM | POA: Insufficient documentation

## 2013-01-10 NOTE — Progress Notes (Signed)
Echocardiogram performed.  

## 2013-01-13 ENCOUNTER — Institutional Professional Consult (permissible substitution): Payer: BC Managed Care – PPO | Admitting: Internal Medicine

## 2013-02-01 ENCOUNTER — Other Ambulatory Visit: Payer: Self-pay

## 2013-02-20 ENCOUNTER — Ambulatory Visit (INDEPENDENT_AMBULATORY_CARE_PROVIDER_SITE_OTHER): Payer: BC Managed Care – PPO | Admitting: Pulmonary Disease

## 2013-02-20 ENCOUNTER — Encounter: Payer: Self-pay | Admitting: Pulmonary Disease

## 2013-02-20 VITALS — BP 150/90 | HR 86 | Temp 97.8°F | Ht 67.25 in | Wt 210.6 lb

## 2013-02-20 DIAGNOSIS — G4733 Obstructive sleep apnea (adult) (pediatric): Secondary | ICD-10-CM

## 2013-02-20 NOTE — Progress Notes (Signed)
Subjective:    Patient ID: [name], male    DOB: [birth year], 67 y.o.   MRN: [id number]  HPI The patient is a 67 year old male who I've been asked to see for management of obstructed sleep apnea.  He's had a recent sleep study that showed moderate OSA, with an AHI of 22 events per hour, with the majority of events occurring during REM.  The patient has been noted to have loud snoring, as well as witnessed apneas.  He occasionally has choking arousals.  He has very restless sleep with frequent awakenings, and states it is very difficult for him to stay asleep.  He is not rested in the mornings upon arising, and notes definite sleep pressure during the day with inactivity.  He will fall asleep easily in the evenings watching television, and has some sleep pressure with driving.  His weight is been fairly neutral in the last 2 years, and his Epworth sleepiness score today is abnormal at 13.   Sleep Questionnaire What time do you typically go to bed?( Between what hours) 930 930 at 1007 on 02/20/13 by Maisie Fus, CMA How long does it take you to fall asleep? 25-21mins 25-43mins at 1007 on 02/20/13 by Maisie Fus, CMA How many times during the night do you wake up? 5 5 at 1007 on 02/20/13 by Maisie Fus, CMA What time do you get out of bed to start your day? 0530 0530 at 1007 on 02/20/13 by Maisie Fus, CMA Do you drive or operate heavy machinery in your occupation? No No at 1007 on 02/20/13 by Maisie Fus, CMA How much has your weight changed (up or down) over the past two years? (In pounds) 3 lb (1.361 kg)3 lb (1.361 kg) decrease at 1007 on 02/20/13 by Maisie Fus, CMA Have you ever had a sleep study before? No No at 1007 on 02/20/13 by Maisie Fus, CMA Do you currently use CPAP? No No at 1007 on 02/20/13 by Maisie Fus, CMA Do you wear oxygen at any time? No No at 1007 on 02/20/13 by Maisie Fus, CMA   Review of Systems  Constitutional: Negative  for fever and unexpected weight change.  HENT: Negative for ear pain, nosebleeds, congestion, sore throat, rhinorrhea, sneezing, trouble swallowing, dental problem, postnasal drip and sinus pressure.   Eyes: Negative for redness and itching.  Respiratory: Negative for cough, chest tightness, shortness of breath and wheezing.   Cardiovascular: Negative for palpitations and leg swelling.  Gastrointestinal: Negative for nausea and vomiting.  Genitourinary: Negative for dysuria.  Musculoskeletal: Negative for joint swelling.  Skin: Negative for rash.  Neurological: Negative for headaches.  Hematological: Does not bruise/bleed easily.  Psychiatric/Behavioral: Negative for dysphoric mood. The patient is not nervous/anxious.        Objective:   Physical Exam Constitutional:  Overweight male, no acute distress  HENT:  Nares patent without discharge, mild septal deviation to left.   Oropharynx without exudate, palate and uvula are elongated.  Narrowed posterior pharyngeal space.   Eyes:  Perrla, eomi, no scleral icterus  Neck:  No JVD, no TMG  Cardiovascular:  Normal rate, regular rhythm, no rubs or gallops.  No murmurs        Intact distal pulses  Pulmonary :  Normal breath sounds, no stridor or respiratory distress   No rales, rhonchi, or wheezing  Abdominal:  Soft, nondistended, bowel sounds present.  No tenderness noted.   Musculoskeletal:  minimal lower extremity edema  noted.  Lymph Nodes:  No cervical lymphadenopathy noted  Skin:  No cyanosis noted  Neurologic:  Mildly sleepy, but appropriate, moves all 4 extremities without obvious deficit.         Assessment & Plan:

## 2013-02-20 NOTE — Assessment & Plan Note (Signed)
The patient has been found to have moderate obstructive sleep apnea, and by his history this is impacting his quality of life significantly.  I also reviewed with him the impact cardiovascular health.  At this point, I think CPAP represents his best treatment, while he is working on weight loss.  The patient is agreeable to trying this.  I will set the patient up on cpap at a moderate pressure level to allow for desensitization, and will troubleshoot the device over the next 4-6weeks if needed.  The pt is to call me if having issues with tolerance.  Will then optimize the pressure once patient is able to wear cpap on a consistent basis.

## 2013-02-20 NOTE — Patient Instructions (Addendum)
Will start on cpap at a moderate pressure.  Please call if having tolerance issues. Work on weight loss followup with me in 6 weeks.

## 2013-04-03 ENCOUNTER — Ambulatory Visit: Payer: BC Managed Care – PPO | Admitting: Pulmonary Disease

## 2013-05-04 ENCOUNTER — Other Ambulatory Visit: Payer: Self-pay

## 2013-05-10 ENCOUNTER — Other Ambulatory Visit: Payer: Self-pay | Admitting: Gastroenterology

## 2014-03-08 ENCOUNTER — Ambulatory Visit (INDEPENDENT_AMBULATORY_CARE_PROVIDER_SITE_OTHER): Payer: Medicare Other | Admitting: Surgery

## 2015-01-16 ENCOUNTER — Encounter: Payer: Self-pay | Admitting: Internal Medicine

## 2015-01-16 ENCOUNTER — Ambulatory Visit (INDEPENDENT_AMBULATORY_CARE_PROVIDER_SITE_OTHER): Payer: BLUE CROSS/BLUE SHIELD | Admitting: Internal Medicine

## 2015-01-16 VITALS — BP 132/74 | HR 80 | Ht 67.0 in | Wt 202.0 lb

## 2015-01-16 DIAGNOSIS — Z01818 Encounter for other preprocedural examination: Secondary | ICD-10-CM

## 2015-01-16 DIAGNOSIS — I25709 Atherosclerosis of coronary artery bypass graft(s), unspecified, with unspecified angina pectoris: Secondary | ICD-10-CM | POA: Diagnosis not present

## 2015-01-16 DIAGNOSIS — R000 Tachycardia, unspecified: Secondary | ICD-10-CM

## 2015-01-16 DIAGNOSIS — I471 Supraventricular tachycardia: Secondary | ICD-10-CM | POA: Diagnosis not present

## 2015-01-16 NOTE — Patient Instructions (Signed)
Medication Instructions:  Your physician recommends that you continue on your current medications as directed. Please refer to the Current Medication list given to you today.  Labwork: None ordered  Testing/Procedures: Your physician has requested that you have an echocardiogram. Echocardiography is a painless test that uses sound waves to create images of your heart. It provides your doctor with information about the size and shape of your heart and how well your heart's chambers and valves are working. This procedure takes approximately one hour. There are no restrictions for this procedure.  Follow-Up: Your physician wants you to follow-up in: 1 year with Dr. Caryl Comes.  You will receive a reminder letter in the mail two months in advance. If you don't receive a letter, please call our office to schedule the follow-up appointment.   Any Other Special Instructions Will Be Listed Below (If Applicable). Thank you for choosing [facility]!!

## 2015-01-16 NOTE — Progress Notes (Signed)
Patient Care Team: [name], MD as PCP - General (Family Medicine)   HPI  [name] is a 69 y.o. male  He is a history of a catheterization in 2008 demonstrate severe LAD disease and moderate circumflex disease with an occluded posterolateral branch. He had normal LV function with perfusion abnormality on Myoview scan is wanting to that distribution. He underwent DES stenting  The patient denies chest pain, shortness of breath, nocturnal dyspnea, orthopnea or peripheral edema.  There have been no palpitations, lightheadedness or syncope.   Able to climb stairs without difficulty   2013 he underwent echocardiogram ordered by his PCP. This demonstrated normal left ventricular function, mild left ventricular hypertrophy Past Medical History  Diagnosis Date  . Colon polyp   . Hyperlipidemia   . Wears glasses   . Hearing loss     wears hearing aid  . Esophageal reflux   . FHx: colonic polyps 11/04, 01/2009  . Fe deficiency anemia 2012  . Heart attack   . CAD (coronary artery disease)   . Hypertension   . Gastritis   . Hypogonadism male   . BPH (benign prostatic hyperplasia)   . Hemorrhoids, internal   . Anemia     Past Surgical History  Procedure Laterality Date  . Hand surgery      left 2010, right 2012  . Knee surgery  1994    left  . Coronary angioplasty with stent placement  2007  . Left shoulder    . Appendectomy    . Left knee      x3  . Vasectomy    . Prostate surgery    . Right cts      ?  [name] Rotator cuff repair Right 12/2012    Current Outpatient Prescriptions  Medication Sig Dispense Refill  . aspirin 81 MG tablet Take 81 mg by mouth daily.      . chlorthalidone (HYGROTON) 25 MG tablet Take 25 mg by mouth daily.      [name] lisinopril (PRINIVIL,ZESTRIL) 40 MG tablet Take 40 mg by mouth daily.      . Multiple Vitamins-Minerals (MULTIVITAMIN WITH MINERALS) tablet Take 1 tablet by mouth daily.      [name] omeprazole (PRILOSEC) 20 MG capsule Take  20 mg by mouth daily.      . potassium chloride (KLOR-CON) 10 MEQ CR tablet Take 10 mEq by mouth daily.      . rosuvastatin (CRESTOR) 10 MG tablet Take 40 mg by mouth daily.     No current facility-administered medications for this visit.    Allergies  Allergen Reactions  . Clindamycin Hcl   . Metoprolol Succinate     Review of Systems negative except from HPI and PMH  Physical Exam BP 132/74 mmHg  Pulse 80  Ht 5\' 7"  (1.702 m)  Wt 202 lb (91.627 kg)  BMI 31.63 kg/m2 Well developed and well nourished in no acute distress HENT normal E scleral and icterus clear Neck Supple JVP flat; carotids brisk and full Clear to ausculation  Regular rate and rhythm, no murmurs gallops or rub Soft with active bowel sounds No clubbing cyanosis  Edema Alert and oriented, grossly normal motor and sensory function Skin Warm and Dry  ECG  NSR 80  18/11/38  Assessment and  Plan  CAD  S/p stenting  Preop consulation  Stable  With out symptoms of CAD  Good functional capacity  So risks should be acceptable  Will check LV  fuction as not on BB and this owuld prompt a change

## 2015-01-24 ENCOUNTER — Other Ambulatory Visit: Payer: Self-pay | Admitting: Orthopedic Surgery

## 2015-02-11 ENCOUNTER — Telehealth: Payer: Self-pay | Admitting: *Deleted

## 2015-02-11 NOTE — Telephone Encounter (Signed)
Call Documentation      Marcine Matar at 02/11/2015 12:21 PM     Status: Signed       Expand All Collapse All   New message   Wants to know why echo is schedule in Oct due to pending surgery in Sept . Office receive clearance notes.   Request for surgical clearance:  1. What type of surgery is being performed? Total knee replacement  2. When is this surgery scheduled? 9.9.2016 @ 1 pm  3. Are there any medications that need to be held prior to surgery and how long? Office receive clearance notes already. Has questions regarding echo  4. Name of physician performing surgery? lisa wilson calling from dr. Jonni Sanger Mello  5. What is your office phone and fax number? [phone] / fax [phone]

## 2015-02-11 NOTE — H&P (Signed)
TOTAL KNEE ADMISSION H&P  Patient is being admitted for right total knee arthroplasty.  Subjective:  Chief Complaint:right knee pain.  HPI: [name], 69 y.o. male, has a history of pain and functional disability in the right knee due to arthritis and has failed non-surgical conservative treatments for greater than 12 weeks to includeNSAID's and/or analgesics, corticosteriod injections, viscosupplementation injections, use of assistive devices, weight reduction as appropriate and activity modification.  Onset of symptoms was gradual, starting 3 years ago with gradually worsening course since that time. The patient noted prior procedures on the knee to include  arthroscopy on the right knee(s).  Patient currently rates pain in the right knee(s) at 7 out of 10 with activity. Patient has night pain, worsening of pain with activity and weight bearing, pain that interferes with activities of daily living, pain with passive range of motion and joint swelling.  Patient has evidence of subchondral sclerosis, periarticular osteophytes and joint space narrowing by imaging studies. This patient has had osteoarthritis. There is no active infection.  Patient Active Problem List   Diagnosis Date Noted  . OSA (obstructive sleep apnea) 02/20/2013  . Nonspecific abnormal electrocardiogram (ECG) (EKG) 01/09/2013  . CAD (coronary artery disease)  LAD stenting 2008 01/09/2013  . Preoperative cardiovascular examination 01/09/2013  . Sinus tachycardia-Relative 01/09/2013  . HYPERTENSION, BENIGN 05/22/2009  . OBESITY 11/23/2008  . DIZZINESS 11/23/2008   Past Medical History  Diagnosis Date  . Colon polyp   . Hyperlipidemia   . Wears glasses   . Hearing loss     wears hearing aid  . Esophageal reflux   . FHx: colonic polyps 11/04, 01/2009  . Fe deficiency anemia 2012  . Heart attack   . CAD (coronary artery disease)   . Hypertension   . Gastritis   . Hypogonadism male   . BPH (benign prostatic  hyperplasia)   . Hemorrhoids, internal   . Anemia     Past Surgical History  Procedure Laterality Date  . Hand surgery      left 2010, right 2012  . Knee surgery  1994    left  . Coronary angioplasty with stent placement  2007  . Left shoulder    . Appendectomy    . Left knee      x3  . Vasectomy    . Prostate surgery    . Right cts      ?  [name] Rotator cuff repair Right 12/2012    No prescriptions prior to admission   Allergies  Allergen Reactions  . Clindamycin Hcl   . Metoprolol Succinate     Social History  Substance Use Topics  . Smoking status: Former Smoker -- 3.00 packs/day for 15 years    Types: Cigarettes    Quit date: 02/19/1984  . Smokeless tobacco: Not on file  . Alcohol Use: Yes     Comment: 3 or 4 beers per week    Family History  Problem Relation Age of Onset  . Heart disease Mother   . Cancer Father   . Other Sister     brain tumor  . Stroke Brother   . Cancer Sister   . Colon polyps    . Liver disease       Review of Systems  Constitutional: Negative.   HENT: Negative.   Eyes: Negative.   Respiratory: Negative.   Cardiovascular: Negative.   Gastrointestinal: Negative.   Genitourinary: Negative.   Musculoskeletal: Positive for joint pain.  Skin: Negative.  Neurological: Negative.   Endo/Heme/Allergies: Negative.     Objective:  Physical Exam  Constitutional: He is oriented to person, place, and time. He appears well-developed.  HENT:  Head: Normocephalic.  Eyes: EOM are normal.  Neck: Normal range of motion.  Cardiovascular: Normal rate, normal heart sounds and intact distal pulses.   Respiratory: Effort normal and breath sounds normal.  GI: Soft.  Genitourinary:  Deferred  Musculoskeletal:  Right knee pain with ROM  Neurological: He is alert and oriented to person, place, and time.  Skin: Skin is warm and dry.  Psychiatric: His behavior is normal.    Vital signs in last 24 hours:    Labs:   Estimated body mass  index is 31.63 kg/(m^2) as calculated from the following:   Height as of 01/16/15: 5\' 7"  (1.702 m).   Weight as of 01/16/15: 91.627 kg (202 lb).   Imaging Review Plain radiographs demonstrate moderate degenerative joint disease of the right knee(s). The overall alignment ismild varus. The bone quality appears to be good for age and reported activity level.  Assessment/Plan:  End stage arthritis, right knee   The patient history, physical examination, clinical judgment of the provider and imaging studies are consistent with end stage degenerative joint disease of the right knee(s) and total knee arthroplasty is deemed medically necessary. The treatment options including medical management, injection therapy arthroscopy and arthroplasty were discussed at length. The risks and benefits of total knee arthroplasty were presented and reviewed. The risks due to aseptic loosening, infection, stiffness, patella tracking problems, thromboembolic complications and other imponderables were discussed. The patient acknowledged the explanation, agreed to proceed with the plan and consent was signed. Patient is being admitted for inpatient treatment for surgery, pain control, PT, OT, prophylactic antibiotics, VTE prophylaxis, progressive ambulation and ADL's and discharge planning. The patient is planning to be discharged home with home health services.  Contraindications and adverse affects of Tranexamic acid discussed in detail. Patient is not a candidate.

## 2015-02-19 ENCOUNTER — Ambulatory Visit (HOSPITAL_COMMUNITY): Payer: BLUE CROSS/BLUE SHIELD | Attending: Cardiovascular Disease

## 2015-02-19 ENCOUNTER — Other Ambulatory Visit: Payer: Self-pay

## 2015-02-19 DIAGNOSIS — I517 Cardiomegaly: Secondary | ICD-10-CM | POA: Diagnosis not present

## 2015-02-19 DIAGNOSIS — I77819 Aortic ectasia, unspecified site: Secondary | ICD-10-CM | POA: Diagnosis not present

## 2015-02-19 DIAGNOSIS — I471 Supraventricular tachycardia: Secondary | ICD-10-CM

## 2015-02-19 DIAGNOSIS — I371 Nonrheumatic pulmonary valve insufficiency: Secondary | ICD-10-CM | POA: Diagnosis not present

## 2015-02-19 DIAGNOSIS — I25709 Atherosclerosis of coronary artery bypass graft(s), unspecified, with unspecified angina pectoris: Secondary | ICD-10-CM

## 2015-02-19 DIAGNOSIS — I071 Rheumatic tricuspid insufficiency: Secondary | ICD-10-CM | POA: Diagnosis not present

## 2015-02-19 DIAGNOSIS — I340 Nonrheumatic mitral (valve) insufficiency: Secondary | ICD-10-CM | POA: Diagnosis not present

## 2015-02-19 DIAGNOSIS — R000 Tachycardia, unspecified: Secondary | ICD-10-CM

## 2015-02-19 DIAGNOSIS — E785 Hyperlipidemia, unspecified: Secondary | ICD-10-CM | POA: Diagnosis not present

## 2015-02-19 DIAGNOSIS — I10 Essential (primary) hypertension: Secondary | ICD-10-CM | POA: Insufficient documentation

## 2015-02-26 ENCOUNTER — Telehealth: Payer: Self-pay

## 2015-02-26 DIAGNOSIS — I7789 Other specified disorders of arteries and arterioles: Secondary | ICD-10-CM

## 2015-02-26 NOTE — Telephone Encounter (Signed)
Left message to call back.  Orders for CTA in system, just needs to be sent to scheduling.

## 2015-02-26 NOTE — Telephone Encounter (Signed)
-----   Message from Deboraha Sprang, MD sent at 02/24/2015  5:17 PM EDT ----- Please Inform Patient that LV function is normal but his aortic root may be  abnormal and will require further eval.  He should have CTA of his aorta to correctly assess enlargement  >>>>> For reference  A proposed approach to imaging these patients was adapted from guidelines on the management of thoracic aortic disease [4].  ?Sporadic (degenerative) aortic root or ascending aortic aneurysm .3.5 to 4.5 cm: annual CT or MR angiography, echocardiogram to follow valvular disease (if needed) .4.5 to 5.4 cm: biannual CT or MR angiography, echocardiogram to follow valvular disease (if needed)            Thanks

## 2015-02-27 ENCOUNTER — Encounter: Payer: Self-pay | Admitting: Internal Medicine

## 2015-02-27 ENCOUNTER — Other Ambulatory Visit (HOSPITAL_COMMUNITY): Payer: Self-pay | Admitting: *Deleted

## 2015-02-27 NOTE — Progress Notes (Signed)
Medical clearance note dr blomgren on chart and cardiac clearance note dr Caryl Comes on chart for 03-08-15 surgery ekg 01-16-15 epic

## 2015-02-27 NOTE — Telephone Encounter (Signed)
New message  ° ° °Patient calling for test results.   °

## 2015-02-27 NOTE — Telephone Encounter (Signed)
This encounter was created in error - please disregard.

## 2015-02-27 NOTE — Patient Instructions (Addendum)
[name]  02/27/2015   Your procedure is scheduled on: Friday septmeber 9th, 2016  Report to [facility] Main  Entrance take [name]  elevators to 3rd floor to  [place] at 1100 AM.  Call this number if you have problems the morning of surgery [phone]   Remember: ONLY 1 PERSON MAY GO WITH YOU TO SHORT STAY TO GET  READY MORNING OF [name].  Do not eat food :After Midnight, clear liquids midnight until 700 am day of surgery, nothing by mouth after 700 am day of surgery.     Take these medicines the morning of surgery with A SIP OF WATER: omeprazole (prilosec).crestor                               You may not have any metal on your body including hair pins and              piercings  Do not wear jewelry, make-up, lotions, powders or perfumes, deodorant             Do not wear nail polish.  Do not shave  48 hours prior to surgery.              Men may shave face and neck.   Do not bring valuables to the hospital. [name].  Contacts, dentures or bridgework may not be worn into surgery.  Leave suitcase in the car. After surgery it may be brought to your room.     Patients discharged the day of surgery will not be allowed to drive home.  Name and phone number of your driver:  Special Instructions: N/A              Please read over the following fact sheets you were given: _____________________________________________________________________             [name] - Preparing for Surgery Before surgery, you can play an important role.  Because skin is not sterile, your skin needs to be as free of germs as possible.  You can reduce the number of germs on your skin by washing with CHG (chlorahexidine gluconate) soap before surgery.  CHG is an antiseptic cleaner which kills germs and bonds with the skin to continue killing germs even after washing. Please DO NOT use if you have an allergy to CHG  or antibacterial soaps.  If your skin becomes reddened/irritated stop using the CHG and inform your nurse when you arrive at Short Stay. Do not shave (including legs and underarms) for at least 48 hours prior to the first CHG shower.  You may shave your face/neck. Please follow these instructions carefully:  1.  Shower with CHG Soap the night before surgery and the  morning of Surgery.  2.  If you choose to wash your hair, wash your hair first as usual with your  normal  shampoo.  3.  After you shampoo, rinse your hair and body thoroughly to remove the  shampoo.                           4.  Use CHG as you would any other liquid soap.  You can apply chg directly  to  the skin and wash                       Gently with a scrungie or clean washcloth.  5.  Apply the CHG Soap to your body ONLY FROM THE NECK DOWN.   Do not use on face/ open                           Wound or open sores. Avoid contact with eyes, ears mouth and genitals (private parts).                       Wash face,  Genitals (private parts) with your normal soap.             6.  Wash thoroughly, paying special attention to the area where your surgery  will be performed.  7.  Thoroughly rinse your body with warm water from the neck down.  8.  DO NOT shower/wash with your normal soap after using and rinsing off  the CHG Soap.                9.  Pat yourself dry with a clean towel.            10.  Wear clean pajamas.            11.  Place clean sheets on your bed the night of your first shower and do not  sleep with pets. Day of Surgery : Do not apply any lotions/deodorants the morning of surgery.  Please wear clean clothes to the hospital/surgery center.  FAILURE TO FOLLOW THESE INSTRUCTIONS MAY RESULT IN THE CANCELLATION OF YOUR SURGERY PATIENT SIGNATURE_________________________________  NURSE SIGNATURE__________________________________  ________________________________________________________________________    CLEAR LIQUID  DIET   Foods Allowed                                                                     Foods Excluded  Coffee and tea, regular and decaf                             liquids that you cannot  Plain Jell-O in any flavor                                             see through such as: Fruit ices (not with fruit pulp)                                     milk, soups, orange juice  Iced Popsicles                                    All solid food Carbonated beverages, regular and diet  Cranberry, grape and apple juices Sports drinks like Gatorade Lightly seasoned clear broth or consume(fat free) Sugar, honey syrup  Sample Menu Breakfast                                Lunch                                     Supper Cranberry juice                    Beef broth                            Chicken broth Jell-O                                     Grape juice                           Apple juice Coffee or tea                        Jell-O                                      Popsicle                                                Coffee or tea                        Coffee or tea  _____________________________________________________________________    Incentive Spirometer  An incentive spirometer is a tool that can help keep your lungs clear and active. This tool measures how well you are filling your lungs with each breath. Taking long deep breaths may help reverse or decrease the chance of developing breathing (pulmonary) problems (especially infection) following:  A long period of time when you are unable to move or be active. BEFORE THE PROCEDURE   If the spirometer includes an indicator to show your best effort, your nurse or respiratory therapist will set it to a desired goal.  If possible, sit up straight or lean slightly forward. Try not to slouch.  Hold the incentive spirometer in an upright position. INSTRUCTIONS FOR USE   Sit on the edge of  your bed if possible, or sit up as far as you can in bed or on a chair.  Hold the incentive spirometer in an upright position.  Breathe out normally.  Place the mouthpiece in your mouth and seal your lips tightly around it.  Breathe in slowly and as deeply as possible, raising the piston or the ball toward the top of the column.  Hold your breath for 3-5 seconds or for as long as possible. Allow the piston or ball to fall to the bottom of the column.  Remove the mouthpiece from your mouth and breathe out normally.  Rest for a few seconds and repeat Steps 1 through 7 at  least 10 times every 1-2 hours when you are awake. Take your time and take a few normal breaths between deep breaths.  The spirometer may include an indicator to show your best effort. Use the indicator as a goal to work toward during each repetition.  After each set of 10 deep breaths, practice coughing to be sure your lungs are clear. If you have an incision (the cut made at the time of surgery), support your incision when coughing by placing a pillow or rolled up towels firmly against it. Once you are able to get out of bed, walk around indoors and cough well. You may stop using the incentive spirometer when instructed by your caregiver.  RISKS AND COMPLICATIONS  Take your time so you do not get dizzy or light-headed.  If you are in pain, you may need to take or ask for pain medication before doing incentive spirometry. It is harder to take a deep breath if you are having pain. AFTER USE  Rest and breathe slowly and easily.  It can be helpful to keep track of a log of your progress. Your caregiver can provide you with a simple table to help with this. If you are using the spirometer at home, follow these instructions: [place] IF:   You are having difficultly using the spirometer.  You have trouble using the spirometer as often as instructed.  Your pain medication is not giving enough relief while using  the spirometer.  You develop fever of 100.5 F (38.1 C) or higher. SEEK IMMEDIATE MEDICAL CARE IF:   You cough up bloody sputum that had not been present before.  You develop fever of 102 F (38.9 C) or greater.  You develop worsening pain at or near the incision site. MAKE SURE YOU:   Understand these instructions.  Will watch your condition.  Will get help right away if you are not doing well or get worse. Document Released: 10/26/2006 Document Revised: 09/07/2011 Document Reviewed: 12/27/2006 Exit[employer] Patient Information 2014 [employer], [place].   ________________________________________________________________________  WHAT IS A BLOOD TRANSFUSION? Blood Transfusion Information  A transfusion is the replacement of blood or some of its parts. Blood is made up of multiple cells which provide different functions.  Red blood cells carry oxygen and are used for blood loss replacement.  White blood cells fight against infection.  Platelets control bleeding.  Plasma helps clot blood.  Other blood products are available for specialized needs, such as hemophilia or other clotting disorders. BEFORE THE TRANSFUSION  Who gives blood for transfusions?   Healthy volunteers who are fully evaluated to make sure their blood is safe. This is blood bank blood. Transfusion therapy is the safest it has ever been in the practice of medicine. Before blood is taken from a donor, a complete history is taken to make sure that person has no history of diseases nor engages in risky social behavior (examples are intravenous drug use or sexual activity with multiple partners). The donor's travel history is screened to minimize risk of transmitting infections, such as malaria. The donated blood is tested for signs of infectious diseases, such as HIV and hepatitis. The blood is then tested to be sure it is compatible with you in order to minimize the chance of a transfusion reaction. If you or a relative  donates blood, this is often done in anticipation of surgery and is not appropriate for emergency situations. It takes many days to process the donated blood. RISKS AND COMPLICATIONS Although transfusion  therapy is very safe and saves many lives, the main dangers of transfusion include:   Getting an infectious disease.  Developing a transfusion reaction. This is an allergic reaction to something in the blood you were given. Every precaution is taken to prevent this. The decision to have a blood transfusion has been considered carefully by your caregiver before blood is given. Blood is not given unless the benefits outweigh the risks. AFTER THE TRANSFUSION  Right after receiving a blood transfusion, you will usually feel much better and more energetic. This is especially true if your red blood cells have gotten low (anemic). The transfusion raises the level of the red blood cells which carry oxygen, and this usually causes an energy increase.  The nurse administering the transfusion will monitor you carefully for complications. HOME CARE INSTRUCTIONS  No special instructions are needed after a transfusion. You may find your energy is better. Speak with your caregiver about any limitations on activity for underlying diseases you may have. SEEK MEDICAL CARE IF:   Your condition is not improving after your transfusion.  You develop redness or irritation at the intravenous (IV) site. SEEK IMMEDIATE MEDICAL CARE IF:  Any of the following symptoms occur over the next 12 hours:  Shaking chills.  You have a temperature by mouth above 102 F (38.9 C), not controlled by medicine.  Chest, back, or muscle pain.  People around you feel you are not acting correctly or are confused.  Shortness of breath or difficulty breathing.  Dizziness and fainting.  You get a rash or develop hives.  You have a decrease in urine output.  Your urine turns a dark color or changes to pink, red, or brown. Any  of the following symptoms occur over the next 10 days:  You have a temperature by mouth above 102 F (38.9 C), not controlled by medicine.  Shortness of breath.  Weakness after normal activity.  The white part of the eye turns yellow (jaundice).  You have a decrease in the amount of urine or are urinating less often.  Your urine turns a dark color or changes to pink, red, or brown. Document Released: 06/12/2000 Document Revised: 09/07/2011 Document Reviewed: 01/30/2008 [facility] Patient Information 2014 [place], [place].  _______________________________________________________________________

## 2015-02-28 ENCOUNTER — Encounter: Payer: Self-pay | Admitting: Internal Medicine

## 2015-02-28 ENCOUNTER — Other Ambulatory Visit: Payer: Self-pay | Admitting: *Deleted

## 2015-02-28 DIAGNOSIS — Z01812 Encounter for preprocedural laboratory examination: Secondary | ICD-10-CM

## 2015-03-01 ENCOUNTER — Other Ambulatory Visit (INDEPENDENT_AMBULATORY_CARE_PROVIDER_SITE_OTHER): Payer: BLUE CROSS/BLUE SHIELD

## 2015-03-01 ENCOUNTER — Encounter (HOSPITAL_COMMUNITY): Payer: Self-pay

## 2015-03-01 ENCOUNTER — Other Ambulatory Visit: Payer: BLUE CROSS/BLUE SHIELD

## 2015-03-01 ENCOUNTER — Encounter (HOSPITAL_COMMUNITY)
Admission: RE | Admit: 2015-03-01 | Discharge: 2015-03-01 | Disposition: A | Discharge status: Home / Self Care | Payer: BLUE CROSS/BLUE SHIELD | Source: Ambulatory Visit | Attending: Ophthalmology | Admitting: Ophthalmology

## 2015-03-01 DIAGNOSIS — M1711 Unilateral primary osteoarthritis, right knee: Secondary | ICD-10-CM | POA: Insufficient documentation

## 2015-03-01 DIAGNOSIS — Z01812 Encounter for preprocedural laboratory examination: Secondary | ICD-10-CM

## 2015-03-01 HISTORY — DX: Type 2 diabetes mellitus without complications: E11.9

## 2015-03-01 LAB — BASIC METABOLIC PANEL
BUN: 18 mg/dL (ref 6–23)
CO2: 30 mEq/L (ref 19–32)
Calcium: 9.4 mg/dL (ref 8.4–10.5)
Chloride: 100 mEq/L (ref 96–112)
Creatinine, Ser: 1.12 mg/dL (ref 0.40–1.50)
GFR: 68.65 mL/min (ref >60.00)
Glucose, Bld: 116 mg/dL — ABNORMAL HIGH (ref 70–99)
POTASSIUM: 3.9 meq/L (ref 3.5–5.1)
SODIUM: 138 meq/L (ref 135–145)

## 2015-03-01 LAB — PROTIME-INR
INR: 1.04 (ref 0.00–1.49)
Prothrombin Time: 13.8 seconds (ref 11.6–15.2)

## 2015-03-01 LAB — CBC
HEMATOCRIT: 41.9 % (ref 39.0–52.0)
Hemoglobin: 13.6 g/dL (ref 13.0–17.0)
MCH: 27 pg (ref 26.0–34.0)
MCHC: 32.5 g/dL (ref 30.0–36.0)
MCV: 83.1 fL (ref 78.0–100.0)
Platelets: 248 10*3/uL (ref 150–400)
RBC: 5.04 MIL/uL (ref 4.22–5.81)
RDW: 13.2 % (ref 11.5–15.5)
WBC: 5.5 10*3/uL (ref 4.0–10.5)

## 2015-03-01 LAB — ABO/RH: ABO/RH(D): A POS

## 2015-03-01 LAB — URINALYSIS, ROUTINE W REFLEX MICROSCOPIC
Bilirubin Urine: NEGATIVE
Glucose, UA: NEGATIVE mg/dL
Hgb urine dipstick: NEGATIVE
KETONES UR: NEGATIVE mg/dL
LEUKOCYTES UA: NEGATIVE
NITRITE: NEGATIVE
PROTEIN: NEGATIVE mg/dL
Specific Gravity, Urine: 1.017 (ref 1.005–1.030)
Urobilinogen, UA: 1 mg/dL (ref 0.0–1.0)
pH: 7.5 (ref 5.0–8.0)

## 2015-03-01 LAB — APTT: APTT: 30 s (ref 24–37)

## 2015-03-01 LAB — SURGICAL PCR SCREEN
MRSA, PCR: NEGATIVE
STAPHYLOCOCCUS AUREUS: POSITIVE — AB

## 2015-03-05 ENCOUNTER — Ambulatory Visit (HOSPITAL_COMMUNITY)
Admission: RE | Admit: 2015-03-05 | Discharge: 2015-03-05 | Disposition: A | Discharge status: Home / Self Care | Payer: BLUE CROSS/BLUE SHIELD | Source: Ambulatory Visit | Attending: Internal Medicine | Admitting: Internal Medicine

## 2015-03-05 ENCOUNTER — Encounter (HOSPITAL_COMMUNITY): Payer: Self-pay

## 2015-03-05 DIAGNOSIS — I2510 Atherosclerotic heart disease of native coronary artery without angina pectoris: Secondary | ICD-10-CM | POA: Insufficient documentation

## 2015-03-05 DIAGNOSIS — I77810 Thoracic aortic ectasia: Secondary | ICD-10-CM | POA: Diagnosis present

## 2015-03-05 DIAGNOSIS — I700 Atherosclerosis of aorta: Secondary | ICD-10-CM | POA: Insufficient documentation

## 2015-03-05 DIAGNOSIS — N281 Cyst of kidney, acquired: Secondary | ICD-10-CM | POA: Insufficient documentation

## 2015-03-05 DIAGNOSIS — K449 Diaphragmatic hernia without obstruction or gangrene: Secondary | ICD-10-CM | POA: Insufficient documentation

## 2015-03-05 DIAGNOSIS — J439 Emphysema, unspecified: Secondary | ICD-10-CM | POA: Insufficient documentation

## 2015-03-05 DIAGNOSIS — I712 Thoracic aortic aneurysm, without rupture: Secondary | ICD-10-CM | POA: Diagnosis not present

## 2015-03-05 DIAGNOSIS — I7789 Other specified disorders of arteries and arterioles: Secondary | ICD-10-CM

## 2015-03-05 HISTORY — DX: Systemic involvement of connective tissue, unspecified: M35.9

## 2015-03-05 MED ORDER — IOHEXOL 350 MG/ML SOLN
100.0000 mL | Freq: Once | INTRAVENOUS | Status: AC | PRN
  Administered 2015-03-05: 100 mL via INTRAVENOUS

## 2015-03-06 ENCOUNTER — Encounter: Payer: Self-pay | Admitting: Internal Medicine

## 2015-03-06 NOTE — Progress Notes (Signed)
Heart cta 03-05-15 epic

## 2015-03-06 NOTE — Telephone Encounter (Signed)
New message ° ° ° ° °Pt calling in regards to test results °

## 2015-03-07 LAB — TYPE AND SCREEN
ABO/RH(D): A POS
Antibody Screen: NEGATIVE

## 2015-03-07 NOTE — Progress Notes (Signed)
New cardiac clearance note dr Caryl Comes faxed from dr Theda Sers office and placed on pt chart

## 2015-03-07 NOTE — Telephone Encounter (Signed)
Faxed clearance to Dr. Theda Sers

## 2015-03-07 NOTE — Telephone Encounter (Signed)
This encounter was created in error - please disregard.

## 2015-03-08 ENCOUNTER — Encounter (HOSPITAL_COMMUNITY)
Admission: RE | Disposition: A | Discharge status: Home Health | Payer: Self-pay | Source: Ambulatory Visit | Attending: Specialist

## 2015-03-08 ENCOUNTER — Inpatient Hospital Stay (HOSPITAL_COMMUNITY)
Admission: RE | Admit: 2015-03-08 | Discharge: 2015-03-10 | DRG: 470 | Disposition: A | Discharge status: Home Health | Payer: BLUE CROSS/BLUE SHIELD | Source: Ambulatory Visit | Attending: Specialist | Admitting: Specialist

## 2015-03-08 ENCOUNTER — Inpatient Hospital Stay (HOSPITAL_COMMUNITY): Payer: BLUE CROSS/BLUE SHIELD | Admitting: Anesthesiology

## 2015-03-08 ENCOUNTER — Encounter (HOSPITAL_COMMUNITY): Payer: Self-pay | Admitting: *Deleted

## 2015-03-08 DIAGNOSIS — E785 Hyperlipidemia, unspecified: Secondary | ICD-10-CM | POA: Diagnosis present

## 2015-03-08 DIAGNOSIS — Z87891 Personal history of nicotine dependence: Secondary | ICD-10-CM

## 2015-03-08 DIAGNOSIS — Z01812 Encounter for preprocedural laboratory examination: Secondary | ICD-10-CM | POA: Diagnosis not present

## 2015-03-08 DIAGNOSIS — N400 Enlarged prostate without lower urinary tract symptoms: Secondary | ICD-10-CM | POA: Diagnosis present

## 2015-03-08 DIAGNOSIS — I10 Essential (primary) hypertension: Secondary | ICD-10-CM | POA: Diagnosis present

## 2015-03-08 DIAGNOSIS — M25561 Pain in right knee: Secondary | ICD-10-CM | POA: Diagnosis present

## 2015-03-08 DIAGNOSIS — I2510 Atherosclerotic heart disease of native coronary artery without angina pectoris: Secondary | ICD-10-CM | POA: Diagnosis present

## 2015-03-08 DIAGNOSIS — Z96659 Presence of unspecified artificial knee joint: Secondary | ICD-10-CM

## 2015-03-08 DIAGNOSIS — K219 Gastro-esophageal reflux disease without esophagitis: Secondary | ICD-10-CM | POA: Diagnosis present

## 2015-03-08 DIAGNOSIS — Z955 Presence of coronary angioplasty implant and graft: Secondary | ICD-10-CM | POA: Diagnosis not present

## 2015-03-08 DIAGNOSIS — M1711 Unilateral primary osteoarthritis, right knee: Principal | ICD-10-CM

## 2015-03-08 DIAGNOSIS — I252 Old myocardial infarction: Secondary | ICD-10-CM

## 2015-03-08 DIAGNOSIS — G4733 Obstructive sleep apnea (adult) (pediatric): Secondary | ICD-10-CM | POA: Diagnosis present

## 2015-03-08 DIAGNOSIS — E119 Type 2 diabetes mellitus without complications: Secondary | ICD-10-CM | POA: Diagnosis present

## 2015-03-08 DIAGNOSIS — H9190 Unspecified hearing loss, unspecified ear: Secondary | ICD-10-CM | POA: Diagnosis present

## 2015-03-08 HISTORY — PX: TOTAL KNEE ARTHROPLASTY: SHX125

## 2015-03-08 LAB — GLUCOSE, CAPILLARY
GLUCOSE-CAPILLARY: 138 mg/dL — AB (ref 65–99)
Glucose-Capillary: 145 mg/dL — ABNORMAL HIGH (ref 65–99)

## 2015-03-08 SURGERY — ARTHROPLASTY, KNEE, TOTAL
Anesthesia: Monitor Anesthesia Care | Site: Knee | Laterality: Right

## 2015-03-08 MED ORDER — DIPHENHYDRAMINE HCL 12.5 MG/5ML PO ELIX: 12.5000 mg | ORAL_SOLUTION | ORAL | Status: DC | PRN

## 2015-03-08 MED ORDER — BUPIVACAINE-EPINEPHRINE 0.25% -1:200000 IJ SOLN
INTRAMUSCULAR | Status: DC | PRN
  Administered 2015-03-08: 30 mL

## 2015-03-08 MED ORDER — METHOCARBAMOL 500 MG PO TABS
500.0000 mg | ORAL_TABLET | Freq: Four times a day (QID) | ORAL | Status: DC | PRN
  Administered 2015-03-09 – 2015-03-10 (×5): 500 mg via ORAL
  Filled 2015-03-08 (×5): qty 1

## 2015-03-08 MED ORDER — PHENOL 1.4 % MT LIQD: 1.0000 | OROMUCOSAL | Status: DC | PRN

## 2015-03-08 MED ORDER — PROPOFOL 10 MG/ML IV BOLUS
INTRAVENOUS | Status: AC
  Filled 2015-03-08: qty 20

## 2015-03-08 MED ORDER — ONDANSETRON HCL 4 MG/2ML IJ SOLN: 4.0000 mg | Freq: Four times a day (QID) | INTRAMUSCULAR | Status: DC | PRN

## 2015-03-08 MED ORDER — BACLOFEN 10 MG PO TABS: 10.0000 mg | ORAL_TABLET | Freq: Three times a day (TID) | ORAL | Status: DC | PRN

## 2015-03-08 MED ORDER — POLYETHYLENE GLYCOL 3350 17 G PO PACK: 17.0000 g | PACK | Freq: Every day | ORAL | Status: DC | PRN

## 2015-03-08 MED ORDER — LIDOCAINE HCL (CARDIAC) 20 MG/ML IV SOLN
INTRAVENOUS | Status: DC | PRN
  Administered 2015-03-08: 50 mg via INTRAVENOUS

## 2015-03-08 MED ORDER — METHOCARBAMOL 1000 MG/10ML IJ SOLN
500.0000 mg | Freq: Four times a day (QID) | INTRAVENOUS | Status: DC | PRN
  Administered 2015-03-08: 500 mg via INTRAVENOUS
  Filled 2015-03-08 (×2): qty 5

## 2015-03-08 MED ORDER — SODIUM CHLORIDE 0.9 % IR SOLN
Status: DC | PRN
  Administered 2015-03-08: 1000 mL

## 2015-03-08 MED ORDER — PROPOFOL INFUSION 10 MG/ML OPTIME
INTRAVENOUS | Status: DC | PRN
  Administered 2015-03-08: 140 ug/kg/min via INTRAVENOUS

## 2015-03-08 MED ORDER — ALUM & MAG HYDROXIDE-SIMETH 200-200-20 MG/5ML PO SUSP
30.0000 mL | ORAL | Status: DC | PRN
  Administered 2015-03-09: 30 mL via ORAL
  Filled 2015-03-08: qty 30

## 2015-03-08 MED ORDER — MIDAZOLAM HCL 2 MG/2ML IJ SOLN
INTRAMUSCULAR | Status: AC
  Filled 2015-03-08: qty 4

## 2015-03-08 MED ORDER — DEXAMETHASONE SODIUM PHOSPHATE 10 MG/ML IJ SOLN
10.0000 mg | Freq: Once | INTRAMUSCULAR | Status: AC
  Administered 2015-03-08: 10 mg via INTRAVENOUS

## 2015-03-08 MED ORDER — MIDAZOLAM HCL 5 MG/5ML IJ SOLN
INTRAMUSCULAR | Status: DC | PRN
  Administered 2015-03-08: 2 mg via INTRAVENOUS

## 2015-03-08 MED ORDER — SODIUM CHLORIDE 0.9 % IJ SOLN
INTRAMUSCULAR | Status: DC | PRN
  Administered 2015-03-08: 30 mL

## 2015-03-08 MED ORDER — ONDANSETRON HCL 4 MG PO TABS: 4.0000 mg | ORAL_TABLET | Freq: Four times a day (QID) | ORAL | Status: DC | PRN

## 2015-03-08 MED ORDER — CEFAZOLIN SODIUM-DEXTROSE 2-3 GM-% IV SOLR
INTRAVENOUS | Status: AC
  Filled 2015-03-08: qty 50

## 2015-03-08 MED ORDER — LINAGLIPTIN 5 MG PO TABS
5.0000 mg | ORAL_TABLET | Freq: Every day | ORAL | Status: DC
  Administered 2015-03-09: 5 mg via ORAL
  Filled 2015-03-08 (×3): qty 1

## 2015-03-08 MED ORDER — BISACODYL 5 MG PO TBEC: 5.0000 mg | DELAYED_RELEASE_TABLET | Freq: Every day | ORAL | Status: DC | PRN

## 2015-03-08 MED ORDER — POTASSIUM CHLORIDE IN NACL 20-0.9 MEQ/L-% IV SOLN
INTRAVENOUS | Status: DC
  Administered 2015-03-08: 21:00:00 via INTRAVENOUS
  Filled 2015-03-08 (×2): qty 1000

## 2015-03-08 MED ORDER — ADULT MULTIVITAMIN W/MINERALS CH
1.0000 | ORAL_TABLET | Freq: Every morning | ORAL | Status: DC
  Administered 2015-03-09 – 2015-03-10 (×2): 1 via ORAL
  Filled 2015-03-08 (×2): qty 1

## 2015-03-08 MED ORDER — ACETAMINOPHEN 325 MG PO TABS
650.0000 mg | ORAL_TABLET | Freq: Four times a day (QID) | ORAL | Status: DC | PRN
  Filled 2015-03-08: qty 2

## 2015-03-08 MED ORDER — METOCLOPRAMIDE HCL 10 MG PO TABS: 5.0000 mg | ORAL_TABLET | Freq: Three times a day (TID) | ORAL | Status: DC | PRN

## 2015-03-08 MED ORDER — BUPIVACAINE HCL (PF) 0.5 % IJ SOLN
INTRAMUSCULAR | Status: DC | PRN
  Administered 2015-03-08: 15 mg

## 2015-03-08 MED ORDER — LISINOPRIL 20 MG PO TABS
20.0000 mg | ORAL_TABLET | Freq: Every morning | ORAL | Status: DC
  Administered 2015-03-10: 20 mg via ORAL
  Filled 2015-03-08 (×2): qty 1

## 2015-03-08 MED ORDER — KETOROLAC TROMETHAMINE 30 MG/ML IJ SOLN
INTRAMUSCULAR | Status: AC
  Filled 2015-03-08: qty 1

## 2015-03-08 MED ORDER — OXYCODONE-ACETAMINOPHEN 5-325 MG PO TABS: 1.0000 | ORAL_TABLET | ORAL | Status: DC | PRN

## 2015-03-08 MED ORDER — LACTATED RINGERS IV SOLN
INTRAVENOUS | Status: DC
  Administered 2015-03-08 (×2): via INTRAVENOUS

## 2015-03-08 MED ORDER — CHLORTHALIDONE 25 MG PO TABS
25.0000 mg | ORAL_TABLET | Freq: Every morning | ORAL | Status: DC
  Administered 2015-03-09 – 2015-03-10 (×2): 25 mg via ORAL
  Filled 2015-03-08 (×2): qty 1

## 2015-03-08 MED ORDER — BUPIVACAINE-EPINEPHRINE (PF) 0.25% -1:200000 IJ SOLN
INTRAMUSCULAR | Status: AC
  Filled 2015-03-08: qty 30

## 2015-03-08 MED ORDER — ACETAMINOPHEN 650 MG RE SUPP: 650.0000 mg | Freq: Four times a day (QID) | RECTAL | Status: DC | PRN

## 2015-03-08 MED ORDER — PANTOPRAZOLE SODIUM 40 MG PO TBEC
40.0000 mg | DELAYED_RELEASE_TABLET | Freq: Every day | ORAL | Status: DC
  Filled 2015-03-08: qty 1

## 2015-03-08 MED ORDER — SODIUM CHLORIDE 0.9 % IJ SOLN
INTRAMUSCULAR | Status: AC
  Filled 2015-03-08: qty 50

## 2015-03-08 MED ORDER — MENTHOL 3 MG MT LOZG: 1.0000 | LOZENGE | OROMUCOSAL | Status: DC | PRN

## 2015-03-08 MED ORDER — METOCLOPRAMIDE HCL 5 MG/ML IJ SOLN: 5.0000 mg | Freq: Three times a day (TID) | INTRAMUSCULAR | Status: DC | PRN

## 2015-03-08 MED ORDER — OXYCODONE HCL 5 MG PO TABS: 5.0000 mg | ORAL_TABLET | Freq: Once | ORAL | Status: DC | PRN

## 2015-03-08 MED ORDER — ENOXAPARIN SODIUM 30 MG/0.3ML ~~LOC~~ SOLN
30.0000 mg | Freq: Two times a day (BID) | SUBCUTANEOUS | Status: DC
  Administered 2015-03-09 – 2015-03-10 (×3): 30 mg via SUBCUTANEOUS
  Filled 2015-03-08 (×5): qty 0.3

## 2015-03-08 MED ORDER — DOCUSATE SODIUM 100 MG PO CAPS
100.0000 mg | ORAL_CAPSULE | Freq: Two times a day (BID) | ORAL | Status: DC
  Administered 2015-03-08 – 2015-03-10 (×3): 100 mg via ORAL
  Filled 2015-03-08 (×5): qty 1

## 2015-03-08 MED ORDER — ZOLPIDEM TARTRATE 5 MG PO TABS: 5.0000 mg | ORAL_TABLET | Freq: Every evening | ORAL | Status: DC | PRN

## 2015-03-08 MED ORDER — OXYCODONE HCL 5 MG PO TABS
5.0000 mg | ORAL_TABLET | ORAL | Status: DC | PRN
  Administered 2015-03-08: 5 mg via ORAL
  Administered 2015-03-09 – 2015-03-10 (×8): 10 mg via ORAL
  Filled 2015-03-08 (×6): qty 2
  Filled 2015-03-08: qty 1
  Filled 2015-03-08 (×2): qty 2

## 2015-03-08 MED ORDER — DEXAMETHASONE SODIUM PHOSPHATE 10 MG/ML IJ SOLN
10.0000 mg | Freq: Once | INTRAMUSCULAR | Status: AC
  Administered 2015-03-09: 10 mg via INTRAVENOUS
  Filled 2015-03-08: qty 1

## 2015-03-08 MED ORDER — KETOROLAC TROMETHAMINE 30 MG/ML IJ SOLN
INTRAMUSCULAR | Status: DC | PRN
  Administered 2015-03-08: 30 mg

## 2015-03-08 MED ORDER — CEFAZOLIN SODIUM-DEXTROSE 2-3 GM-% IV SOLR
2.0000 g | Freq: Four times a day (QID) | INTRAVENOUS | Status: AC
  Administered 2015-03-08 – 2015-03-09 (×2): 2 g via INTRAVENOUS
  Filled 2015-03-08 (×2): qty 50

## 2015-03-08 MED ORDER — ASPIRIN EC 325 MG PO TBEC: 325.0000 mg | DELAYED_RELEASE_TABLET | Freq: Two times a day (BID) | ORAL | Status: DC

## 2015-03-08 MED ORDER — CEFAZOLIN SODIUM-DEXTROSE 2-3 GM-% IV SOLR
2.0000 g | INTRAVENOUS | Status: AC
  Administered 2015-03-08: 2 g via INTRAVENOUS

## 2015-03-08 MED ORDER — MAGNESIUM CITRATE PO SOLN: 1.0000 | Freq: Once | ORAL | Status: DC | PRN

## 2015-03-08 MED ORDER — HYDROMORPHONE HCL 1 MG/ML IJ SOLN
1.0000 mg | INTRAMUSCULAR | Status: DC | PRN
  Administered 2015-03-08: 1 mg via INTRAVENOUS
  Filled 2015-03-08: qty 1

## 2015-03-08 MED ORDER — DEXAMETHASONE SODIUM PHOSPHATE 10 MG/ML IJ SOLN
INTRAMUSCULAR | Status: AC
  Filled 2015-03-08: qty 1

## 2015-03-08 MED ORDER — HYDROMORPHONE HCL 1 MG/ML IJ SOLN: 0.2500 mg | INTRAMUSCULAR | Status: DC | PRN

## 2015-03-08 MED ORDER — FERROUS SULFATE 325 (65 FE) MG PO TABS
325.0000 mg | ORAL_TABLET | Freq: Three times a day (TID) | ORAL | Status: DC
  Administered 2015-03-09 – 2015-03-10 (×4): 325 mg via ORAL
  Filled 2015-03-08 (×7): qty 1

## 2015-03-08 MED ORDER — OXYCODONE HCL 5 MG/5ML PO SOLN
5.0000 mg | Freq: Once | ORAL | Status: DC | PRN
  Filled 2015-03-08: qty 5

## 2015-03-08 MED ORDER — ONDANSETRON HCL 4 MG/2ML IJ SOLN
INTRAMUSCULAR | Status: DC | PRN
  Administered 2015-03-08: 4 mg via INTRAVENOUS

## 2015-03-08 MED ORDER — ROSUVASTATIN CALCIUM 40 MG PO TABS
40.0000 mg | ORAL_TABLET | Freq: Every morning | ORAL | Status: DC
  Administered 2015-03-09 – 2015-03-10 (×2): 40 mg via ORAL
  Filled 2015-03-08 (×2): qty 1

## 2015-03-08 MED ORDER — HYDROMORPHONE HCL 1 MG/ML IJ SOLN
1.0000 mg | INTRAMUSCULAR | Status: DC | PRN
  Administered 2015-03-08 – 2015-03-09 (×2): 2 mg via INTRAVENOUS
  Filled 2015-03-08 (×2): qty 2

## 2015-03-08 SURGICAL SUPPLY — 68 items
BAG DECANTER FOR FLEXI CONT (MISCELLANEOUS) IMPLANT
BAG ZIPLOCK 12X15 (MISCELLANEOUS) ×6 IMPLANT
BANDAGE ELASTIC 4 VELCRO ST LF (GAUZE/BANDAGES/DRESSINGS) ×3 IMPLANT
BANDAGE ELASTIC 6 VELCRO ST LF (GAUZE/BANDAGES/DRESSINGS) ×3 IMPLANT
BANDAGE ESMARK 6X9 LF (GAUZE/BANDAGES/DRESSINGS) ×1 IMPLANT
BLADE SAG 18X100X1.27 (BLADE) ×3 IMPLANT
BLADE SAW SGTL 13.0X1.19X90.0M (BLADE) ×3 IMPLANT
BNDG ESMARK 6X9 LF (GAUZE/BANDAGES/DRESSINGS) ×3
BONE CEMENT GENTAMICIN (Cement) ×6 IMPLANT
CAP KNEE TOTAL 3 SIGMA ×3 IMPLANT
CEMENT BONE GENTAMICIN 40 (Cement) ×2 IMPLANT
CHLORAPREP W/TINT 26ML (MISCELLANEOUS) ×3 IMPLANT
CUFF TOURN SGL QUICK 34 (TOURNIQUET CUFF) ×2
CUFF TRNQT CYL 34X4X40X1 (TOURNIQUET CUFF) ×1 IMPLANT
DECANTER SPIKE VIAL GLASS SM (MISCELLANEOUS) ×3 IMPLANT
DRAPE EXTREMITY T 121X128X90 (DRAPE) ×3 IMPLANT
DRAPE POUCH INSTRU U-SHP 10X18 (DRAPES) ×3 IMPLANT
DRAPE SHEET LG 3/4 BI-LAMINATE (DRAPES) ×3 IMPLANT
DRAPE U-SHAPE 47X51 STRL (DRAPES) ×3 IMPLANT
DRSG AQUACEL AG ADV 3.5X10 (GAUZE/BANDAGES/DRESSINGS) ×3 IMPLANT
DRSG TEGADERM 4X4.75 (GAUZE/BANDAGES/DRESSINGS) ×3 IMPLANT
ELECT REM PT RETURN 9FT ADLT (ELECTROSURGICAL) ×3
ELECTRODE REM PT RTRN 9FT ADLT (ELECTROSURGICAL) ×1 IMPLANT
EVACUATOR 1/8 PVC DRAIN (DRAIN) ×3 IMPLANT
FACESHIELD WRAPAROUND (MASK) ×15 IMPLANT
GAUZE SPONGE 2X2 8PLY STRL LF (GAUZE/BANDAGES/DRESSINGS) ×1 IMPLANT
GLOVE BIOGEL PI IND STRL 8 (GLOVE) ×2 IMPLANT
GLOVE BIOGEL PI INDICATOR 8 (GLOVE) ×4
GLOVE SURG ORTHO 8.0 STRL STRW (GLOVE) ×3 IMPLANT
GLOVE SURG ORTHO 9.0 STRL STRW (GLOVE) ×3 IMPLANT
GLOVE SURG SS PI 7.5 STRL IVOR (GLOVE) ×3 IMPLANT
GOWN STRL REUS W/TWL XL LVL3 (GOWN DISPOSABLE) ×6 IMPLANT
HANDPIECE INTERPULSE COAX TIP (DISPOSABLE) ×2
IMMOBILIZER KNEE 20 (SOFTGOODS) ×3 IMPLANT
IMMOBILIZER KNEE 20 THIGH 36 (SOFTGOODS) IMPLANT
KIT BASIN OR (CUSTOM PROCEDURE TRAY) ×3 IMPLANT
LIQUID BAND (GAUZE/BANDAGES/DRESSINGS) ×3 IMPLANT
NDL SAFETY ECLIPSE 18X1.5 (NEEDLE) ×1 IMPLANT
NEEDLE HYPO 18GX1.5 SHARP (NEEDLE) ×3
NS IRRIG 1000ML POUR BTL (IV SOLUTION) ×3 IMPLANT
PACK TOTAL JOINT (CUSTOM PROCEDURE TRAY) ×3 IMPLANT
PEN SKIN MARKING BROAD (MISCELLANEOUS) ×3 IMPLANT
POSITIONER SURGICAL ARM (MISCELLANEOUS) ×3 IMPLANT
SET HNDPC FAN SPRY TIP SCT (DISPOSABLE) ×1 IMPLANT
SET PAD KNEE POSITIONER (MISCELLANEOUS) ×3 IMPLANT
SPONGE GAUZE 2X2 STER 10/PKG (GAUZE/BANDAGES/DRESSINGS) ×2
SPONGE LAP 18X18 X RAY DECT (DISPOSABLE) IMPLANT
SPONGE SURGIFOAM ABS GEL 100 (HEMOSTASIS) ×3 IMPLANT
STOCKINETTE 6  STRL (DRAPES) ×2
STOCKINETTE 6 STRL (DRAPES) ×1 IMPLANT
SUCTION FRAZIER 12FR DISP (SUCTIONS) ×3 IMPLANT
SUT BONE WAX W31G (SUTURE) IMPLANT
SUT MNCRL AB 3-0 PS2 18 (SUTURE) ×3 IMPLANT
SUT VIC AB 1 CT1 27 (SUTURE) ×8
SUT VIC AB 1 CT1 27XBRD ANTBC (SUTURE) ×4 IMPLANT
SUT VIC AB 2-0 CT1 27 (SUTURE) ×4
SUT VIC AB 2-0 CT1 TAPERPNT 27 (SUTURE) ×2 IMPLANT
SUT VLOC 180 0 24IN GS25 (SUTURE) ×3 IMPLANT
SYR 50ML LL SCALE MARK (SYRINGE) ×3 IMPLANT
TAPE STRIPS DRAPE STRL (GAUZE/BANDAGES/DRESSINGS) ×3 IMPLANT
TOWEL OR 17X26 10 PK STRL BLUE (TOWEL DISPOSABLE) ×3 IMPLANT
TOWEL OR NON WOVEN STRL DISP B (DISPOSABLE) ×3 IMPLANT
TOWER CARTRIDGE SMART MIX (DISPOSABLE) ×3 IMPLANT
TRAY FOLEY W/METER SILVER 14FR (SET/KITS/TRAYS/PACK) IMPLANT
TRAY FOLEY W/METER SILVER 16FR (SET/KITS/TRAYS/PACK) ×3 IMPLANT
WATER STERILE IRR 1500ML POUR (IV SOLUTION) ×6 IMPLANT
WRAP KNEE MAXI GEL POST OP (GAUZE/BANDAGES/DRESSINGS) ×3 IMPLANT
YANKAUER SUCT BULB TIP NO VENT (SUCTIONS) ×3 IMPLANT

## 2015-03-08 NOTE — Anesthesia Preprocedure Evaluation (Signed)
Anesthesia Evaluation  Patient identified by MRN, date of birth, ID band Patient awake    Reviewed: Allergy & Precautions, NPO status , Patient's Chart, lab work & pertinent test results  Airway Mallampati: II   Neck ROM: full    Dental   Pulmonary sleep apnea , former smoker,    breath sounds clear to auscultation       Cardiovascular hypertension, + CAD and + Past MI   Rhythm:regular Rate:Normal     Neuro/Psych    GI/Hepatic GERD  ,  Endo/Other  diabetes, Type 2  Renal/GU      Musculoskeletal   Abdominal   Peds  Hematology   Anesthesia Other Findings   Reproductive/Obstetrics                             Anesthesia Physical Anesthesia Plan  ASA: III  Anesthesia Plan: MAC and Spinal   Post-op Pain Management:    Induction: Intravenous  Airway Management Planned: Simple Face Mask  Additional Equipment:   Intra-op Plan:   Post-operative Plan:   Informed Consent: I have reviewed the patients History and Physical, chart, labs and discussed the procedure including the risks, benefits and alternatives for the proposed anesthesia with the patient or authorized representative who has indicated his/her understanding and acceptance.     Plan Discussed with: CRNA, Anesthesiologist and Surgeon  Anesthesia Plan Comments:         Anesthesia Quick Evaluation

## 2015-03-08 NOTE — Interval H&P Note (Signed)
History and Physical Interval Note:  03/08/2015 2:48 PM  Kem Boroughs  has presented today for surgery, with the diagnosis of RIGHT KNEE OA  The various methods of treatment have been discussed with the patient and family. After consideration of risks, benefits and other options for treatment, the patient has consented to  Procedure(s): RIGHT TOTAL KNEE ARTHROPLASTY (Right) as a surgical intervention .  The patient's history has been reviewed, patient examined, no change in status, stable for surgery.  I have reviewed the patient's chart and labs.  Questions were answered to the patient's satisfaction.     Bonello,Dalphine Cowie ANDREW

## 2015-03-08 NOTE — Anesthesia Postprocedure Evaluation (Signed)
  Anesthesia Post-op Note  Patient: [name]  Procedure(s) Performed: Procedure(s): RIGHT TOTAL KNEE ARTHROPLASTY (Right)  Patient Location: PACU  Anesthesia Type:Spinal  Level of Consciousness: awake, alert  and oriented  Airway and Oxygen Therapy: Patient Spontanous Breathing  Post-op Pain: none  Post-op Assessment: Post-op Vital signs reviewed, Patient's Cardiovascular Status Stable and Respiratory Function Stable LLE Motor Response: No movement due to regional block   RLE Motor Response: No movement due to regional block   L Sensory Level: L2-Upper inner thigh, upper buttock R Sensory Level: L2-Upper inner thigh, upper buttock  Post-op Vital Signs: Reviewed and stable  Last Vitals:  Filed Vitals:   03/08/15 1830  BP: 142/65  Pulse: 66  Temp:   Resp: 13    Complications: No apparent anesthesia complications

## 2015-03-08 NOTE — Anesthesia Procedure Notes (Signed)
Spinal Patient location during procedure: OR Start time: 03/08/2015 2:55 PM End time: 03/08/2015 3:01 PM Staffing Resident/CRNA: Sherian Maroon A Performed by: resident/CRNA  Preanesthetic Checklist Completed: patient identified, site marked, surgical consent, pre-op evaluation, timeout performed, IV checked, risks and benefits discussed and monitors and equipment checked Spinal Block Patient position: sitting Prep: Betadine Patient monitoring: heart rate, cardiac monitor, continuous pulse ox and blood pressure Approach: midline Location: L3-4 Needle Needle type: Spinocan  Needle gauge: 22 G Needle length: 9 cm Needle insertion depth: 4 cm Assessment Sensory level: T10

## 2015-03-08 NOTE — Plan of Care (Signed)
Problem: Consults Goal: Diagnosis- Total Joint Replacement Outcome: Completed/Met Date Met:  03/08/15 Primary Total Knee RIGHT

## 2015-03-08 NOTE — Transfer of Care (Signed)
Immediate Anesthesia Transfer of Care Note  Patient: [name]  Procedure(s) Performed: Procedure(s): RIGHT TOTAL KNEE ARTHROPLASTY (Right)  Patient Location: PACU  Anesthesia Type:Spinal  Level of Consciousness: awake, alert , oriented and patient cooperative  Airway & Oxygen Therapy: Patient Spontanous Breathing and Patient connected to face mask oxygen  Post-op Assessment: Report given to RN and Post -op Vital signs reviewed and stable  Post vital signs: Reviewed and stable  Last Vitals:  Filed Vitals:   03/08/15 1107  BP: 140/99  Pulse: 91  Temp: 36.8 C  Resp: 18    Complications: No apparent anesthesia complications

## 2015-03-08 NOTE — Op Note (Signed)
DATE OF SURGERY:  03/08/2015  TIME: 5:35 PM  PATIENT NAME:  [name]    AGE: 69 y.o.   PRE-OPERATIVE DIAGNOSIS:  RIGHT KNEE OA  POST-OPERATIVE DIAGNOSIS:  RIGHT KNEE OA  PROCEDURE:  Procedure(s): RIGHT TOTAL KNEE ARTHROPLASTY  SURGEON:  Wellen,Khrystian Schauf ANDREW  ASSISTANT:  Bryson Stilwell, PA-C, present and scrubbed throughout the case, critical for assistance with exposure, retraction, instrumentation, and closure.  OPERATIVE IMPLANTS: Depuy PFC Sigma Rotating Platform.  Femur size 4, Tibia size 5, Patella size 38 3-peg oval button, with a 10 mm polyethylene insert.   PREOPERATIVE INDICATIONS:   [name] is a 69 y.o. year old male with end stage bone on bone arthritis of the knee who failed conservative treatment and elected for Total Knee Arthroplasty.   The risks, benefits, and alternatives were discussed at length including but not limited to the risks of infection, bleeding, nerve injury, stiffness, blood clots, the need for revision surgery, cardiopulmonary complications, among others, and they were willing to proceed.  OPERATIVE DESCRIPTION:  The patient was brought to the operative room and placed in a supine position.  Spinal anesthesia was administered.  IV antibiotics were given.  The lower extremity was prepped and draped in the usual sterile fashion.  Time out was performed.  The leg was elevated and exsanguinated and the tourniquet was inflated.  Anterior quadriceps tendon splitting approach was performed.  The patella was retracted and osteophytes were removed.  The anterior horn of the medial and lateral meniscus was removed and cruciate ligaments resected.   The distal femur was opened with the drill and the intramedullary distal femoral cutting jig was utilized, set at 5 degrees resecting 10 mm off the distal femur.  Care was taken to protect the collateral ligaments.  The distal femoral sizing jig was applied, taking care to avoid notching.  Then the  4-in-1 cutting jig was applied and the anterior and posterior femur was cut, along with the chamfer cuts.    Then the extramedullary tibial cutting jig was utilized making the appropriate cut using the anterior tibial crest as a reference building in appropriate posterior slope.  Care was taken during the cut to protect the medial and collateral ligaments.  The proximal tibia was removed along with the posterior horns of the menisci.   The posterior medial femoral osteophytes and posterior lateral femoral osteophytes were removed.    The flexion gap was then measured and was symmetric with the extension gap, measured at 10.  I completed the distal femoral preparation using the appropriate jig to prepare the box.  The patella was then measured, and cut with the saw.    The proximal tibia sized and prepared accordingly with the reamer and the punch, and then all components were trialed with the trial insert.  The knee was found to have excellent balance and full motion.    The above named components were then cemented into place and all excess cement was removed.  The trial polyethylene component was in place during cementation, and then was exchanged for the real polyethylene component.    The knee was easily taken through a range of motion and the patella tracked well and the knee irrigated copiously and the parapatellar and subcutaneous tissue closed with vicryl, and monocryl with steri strips for the skin.  The arthrotomy was closed at 90 of flexion. The wounds were dressed with sterile gauze and the tourniquet released and the patient was awakened and returned to the PACU in stable  and satisfactory condition.  There were no complications.  Total tourniquet time was 120 minutes. Vlo closure.

## 2015-03-09 LAB — BASIC METABOLIC PANEL
ANION GAP: 5 (ref 5–15)
BUN: 17 mg/dL (ref 6–20)
CALCIUM: 8.8 mg/dL — AB (ref 8.9–10.3)
CO2: 28 mmol/L (ref 22–32)
Chloride: 101 mmol/L (ref 101–111)
Creatinine, Ser: 0.99 mg/dL (ref 0.61–1.24)
GFR calc Af Amer: >60 mL/min (ref >60)
Glucose, Bld: 212 mg/dL — ABNORMAL HIGH (ref 65–99)
POTASSIUM: 4.8 mmol/L (ref 3.5–5.1)
SODIUM: 134 mmol/L — AB (ref 135–145)

## 2015-03-09 LAB — CBC
HEMATOCRIT: 36.5 % — AB (ref 39.0–52.0)
Hemoglobin: 12.1 g/dL — ABNORMAL LOW (ref 13.0–17.0)
MCH: 27.3 pg (ref 26.0–34.0)
MCHC: 33.2 g/dL (ref 30.0–36.0)
MCV: 82.2 fL (ref 78.0–100.0)
Platelets: 233 10*3/uL (ref 150–400)
RBC: 4.44 MIL/uL (ref 4.22–5.81)
RDW: 12.9 % (ref 11.5–15.5)
WBC: 11.3 10*3/uL — AB (ref 4.0–10.5)

## 2015-03-09 MED ORDER — NON FORMULARY: 20.0000 mg | Freq: Every morning | Status: DC

## 2015-03-09 MED ORDER — OMEPRAZOLE 20 MG PO CPDR
20.0000 mg | DELAYED_RELEASE_CAPSULE | Freq: Every day | ORAL | Status: DC
Start: 2015-03-09 — End: 2015-03-10
  Administered 2015-03-10: 20 mg via ORAL
  Filled 2015-03-09 (×3): qty 1

## 2015-03-09 MED ORDER — INFLUENZA VAC SPLIT QUAD 0.5 ML IM SUSY
0.5000 mL | PREFILLED_SYRINGE | INTRAMUSCULAR | Status: DC
  Administered 2015-03-10: 0.5 mL via INTRAMUSCULAR
  Filled 2015-03-09 (×2): qty 0.5

## 2015-03-09 MED ORDER — SIMETHICONE 80 MG PO CHEW
80.0000 mg | CHEWABLE_TABLET | Freq: Four times a day (QID) | ORAL | Status: DC | PRN
  Administered 2015-03-09 – 2015-03-10 (×2): 80 mg via ORAL
  Filled 2015-03-09 (×3): qty 1

## 2015-03-09 NOTE — Care Management Note (Signed)
Case Management Note  Patient Details  Name: [name] MRN: [id number] Date of Birth: 1945/11/13  Subjective/Objective:       Right total knee arthroplasty  Action/Plan: Home Health  Expected Discharge Date:  03/10/15               Expected Discharge Plan:  [place]  In-House Referral:     Discharge planning Services  CM Consult  Post Acute Care Choice:  Home Health Choice offered to:  Patient  DME Arranged:  Walker rolling DME Agency:  [place]:  PT [facility] Agency:  [place]  Status of Service:  Completed, signed off  Medicare Important Message Given:    Date Medicare IM Given:    Medicare IM give by:    Date Additional Medicare IM Given:    Additional Medicare Important Message give by:     If discussed at [place] of Stay Meetings, dates discussed:    Additional Comments: NCM spoke to pt and offered choice for [facility]. Pt agreeable to [facility] for [facility]. Pt requesting RW for home. Contacted [facility] for RW for home.  Erenest Rasher, RN 03/09/2015, 3:05 PM

## 2015-03-09 NOTE — Evaluation (Signed)
Physical Therapy Evaluation Patient Details Name: [name] MRN: [id number] DOB: 01-29-1946 Today's Date: 03/09/2015   History of Present Illness  R TKA  Clinical Impression  Pt will benefit from PT in acute settting to address deficits below; Pt will benefit from HHPT at D/C, needs RW;    Follow Up Recommendations Home health PT    Equipment Recommendations  Rolling walker with 5" wheels    Recommendations for Other Services       Precautions / Restrictions Precautions Precautions: Knee Precaution Comments: pt able to do I SLR but KI kept on d/t bleeding from hemovac site Required Braces or Orthoses: Knee Immobilizer - Right Knee Immobilizer - Right: Discontinue once straight leg raise with < 10 degree lag Restrictions Weight Bearing Restrictions: No Other Position/Activity Restrictions: WBAT      Mobility  Bed Mobility Overal bed mobility: Needs Assistance Bed Mobility: Supine to Sit     Supine to sit: Min guard     General bed mobility comments: for safety  Transfers Overall transfer level: Needs assistance Equipment used: Rolling walker (2 wheeled) Transfers: Sit to/from Stand           General transfer comment: cues for hand placement and RLE management  Ambulation/Gait Ambulation/Gait assistance: Min guard;Min assist Ambulation Distance (Feet): 100 Feet Assistive device: Rolling walker (2 wheeled) Gait Pattern/deviations: Step-through pattern;Trunk flexed     General Gait Details: cues for RW postion from self and safety, especially with turns; LOB x 1 with min to recover  Stairs            Wheelchair Mobility    Modified Rankin (Stroke Patients Only)       Balance                                             Pertinent Vitals/Pain Pain Assessment: 0-10 Pain Score: 1  Pain Location: R knee Pain Descriptors / Indicators: Sore Pain Intervention(s): Limited activity within patient's tolerance;Monitored during  session;Premedicated before session;Repositioned    Home Living Family/patient expects to be discharged to:: Private residence   Available Help at Discharge: Family Type of Home: Warehouse manager of Steps: 2 Home Layout: One level Home Equipment: Crutches;Cane - single point      Prior Function Level of Independence: Independent               Hand Dominance        Extremity/Trunk Assessment               Lower Extremity Assessment: RLE deficits/detail RLE Deficits / Details: hip flexion and knee extension 3/5, ankle WFL       Communication   Communication: No difficulties  Cognition Arousal/Alertness: Awake/alert Behavior During Therapy: WFL for tasks assessed/performed Overall Cognitive Status: Within Functional Limits for tasks assessed                      General Comments      Exercises        Assessment/Plan    PT Assessment Patient needs continued PT services  PT Diagnosis Difficulty walking   PT Problem List Decreased strength;Decreased range of motion;Decreased activity tolerance;Decreased mobility;Decreased knowledge of use of DME  PT Treatment Interventions DME instruction;Gait training;Functional mobility training;Therapeutic activities;Stair training;Therapeutic exercise;Patient/family education   PT Goals (Current goals can be found in the Care  Plan section) Acute Rehab PT Goals Patient Stated Goal: home PT Goal Formulation: With patient Time For Goal Achievement: 03/16/15 Potential to Achieve Goals: Good    Frequency 7X/week   Barriers to discharge        Co-evaluation               End of Session Equipment Utilized During Treatment: Gait belt Activity Tolerance: Patient tolerated treatment well;No increased pain Patient left: with call bell/phone within reach;in chair Nurse Communication: Mobility status         Time: 2122-4825 PT Time Calculation (min) (ACUTE ONLY): 21  min   Charges:   PT Evaluation $Initial PT Evaluation Tier I: 1 Procedure     PT G Codes:        Damir Leung 03-18-2015, 1:19 PM

## 2015-03-09 NOTE — Clinical Social Work Note (Signed)
CSW reviewed pt consult for SNF  CSW reviewed pt chart that reflected PT evaluation recommending HH/PT  No CSW needs  CSW signing off  .Dede Query, LCSW [facility] Clinical Social Worker - Weekend Coverage cell #: [phone]

## 2015-03-09 NOTE — Evaluation (Signed)
Occupational Therapy Evaluation Patient Details Name: Jaleen Finch MRN: [id number] DOB: 03/03/1946 Today's Date: 03/09/2015    History of Present Illness R TKA   Clinical Impression   Pt up to the bathroom to practice 3in1 transfers. Discussed use of 3in1 as shower chair. Pt does need cues to slow down for safety and with safe use of walker. Will follow on acute to progress ADL independence for d/c home with multiple family members alternately assisting.    Follow Up Recommendations  No OT follow up;Supervision/Assistance - 24 hour    Equipment Recommendations  3 in 1 bedside comode    Recommendations for Other Services       Precautions / Restrictions Precautions Precautions: Knee Precaution Comments: pt able to do I SLR but KI kept on d/t bleeding from hemovac site Required Braces or Orthoses: Knee Immobilizer - Right Knee Immobilizer - Right: Discontinue once straight leg raise with < 10 degree lag Restrictions Weight Bearing Restrictions: No Other Position/Activity Restrictions: WBAT      Mobility Bed Mobility      General bed mobility comments: in chair.   Transfers Overall transfer level: Needs assistance Equipment used: Rolling walker (2 wheeled) Transfers: Sit to/from Stand Sit to Stand: Min assist         General transfer comment: cues for hand placement and LE management.    Balance                                            ADL Overall ADL's : Needs assistance/impaired Eating/Feeding: Independent;Sitting   Grooming: Wash/dry hands;Set up;Sitting   Upper Body Bathing: Set up;Sitting   Lower Body Bathing: Minimal assistance;Sit to/from stand   Upper Body Dressing : Set up;Sitting   Lower Body Dressing: Minimal assistance;Sit to/from stand   Toilet Transfer: Minimal assistance;Ambulation;BSC;RW   Toileting- Clothing Manipulation and Hygiene: Minimal assistance;Sit to/from stand         General ADL Comments:  Educated on AE options but pt states his family can help with self care tasks. Pt did state he may be interested in the reacher so educated on where to obtain reacher. Pt moves quickly so cautioned him to slow down for safety especially with turns. He needed some cues for hand placement and to make sure he backs up all the way to his surface before he lets go of walker. He also attempted to stand before walker was placed in front of him so reinforced need to make sure walker is in front of him before he stands.      Vision     Perception     Praxis      Pertinent Vitals/Pain Pain Assessment: 0-10 Pain Score: 5  Pain Location: R knee Pain Descriptors / Indicators: Sore Pain Intervention(s): Monitored during session;Repositioned;Patient requesting pain meds-RN notified     Hand Dominance     Extremity/Trunk Assessment Upper Extremity Assessment Upper Extremity Assessment: Overall WFL for tasks assessed          Communication Communication Communication: No difficulties   Cognition Arousal/Alertness: Awake/alert Behavior During Therapy: WFL for tasks assessed/performed Overall Cognitive Status: Within Functional Limits for tasks assessed                     General Comments       Exercises       Shoulder Instructions  Home Living Family/patient expects to be discharged to:: Private residence   Available Help at Discharge: Family Type of Home: House   Entrance Stairs-Number of Steps: 2   Home Layout: One level     Bathroom Shower/Tub: Occupational psychologist: Standard     Home Equipment: Crutches;Cane - single point          Prior Functioning/Environment Level of Independence: Independent             OT Diagnosis: Generalized weakness   OT Problem List: Decreased strength;Decreased knowledge of use of DME or AE   OT Treatment/Interventions: Self-care/ADL training;Patient/family education;Therapeutic activities;DME and/or AE  instruction    OT Goals(Current goals can be found in the care plan section) Acute Rehab OT Goals Patient Stated Goal: home OT Goal Formulation: With patient Time For Goal Achievement: 03/16/15 Potential to Achieve Goals: Good  OT Frequency: Min 2X/week   Barriers to D/C:            Co-evaluation              End of Session Equipment Utilized During Treatment: Gait belt;Rolling walker;Right knee immobilizer  Activity Tolerance: Patient tolerated treatment well Patient left: with call bell/phone within reach (EOB with nursing tech)   Time: 1779-3903 OT Time Calculation (min): 28 min Charges:  OT General Charges $OT Visit: 1 Procedure OT Evaluation $Initial OT Evaluation Tier I: 1 Procedure OT Treatments $Therapeutic Activity: 8-22 mins G-Codes:    Jules Schick  [phone] 03/09/2015, 4:06 PM

## 2015-03-09 NOTE — Progress Notes (Signed)
Physical Therapy Treatment Patient Details Name: [name] MRN: [id number] DOB: June 09, 1946 Today's Date: 03/09/2015    History of Present Illness R TKA    PT Comments    Pt progressing well  Follow Up Recommendations  Home health PT     Equipment Recommendations  Rolling walker with 5" wheels    Recommendations for Other Services       Precautions / Restrictions Precautions Precautions: Knee Precaution Comments: pt able to do I SLR, KI not used Required Braces or Orthoses: Knee Immobilizer - Right Knee Immobilizer - Right: Discontinue once straight leg raise with < 10 degree lag Restrictions Weight Bearing Restrictions: No Other Position/Activity Restrictions: WBAT    Mobility  Bed Mobility Overal bed mobility: Modified Independent             General bed mobility comments: in chair.   Transfers Overall transfer level: Needs assistance Equipment used: Rolling walker (2 wheeled) Transfers: Sit to/from Stand Sit to Stand: Supervision         General transfer comment: cues for hand placement and LE management.  Ambulation/Gait Ambulation/Gait assistance: Min guard;Min assist Ambulation Distance (Feet): 140 Feet Assistive device: Rolling walker (2 wheeled) Gait Pattern/deviations: Step-through pattern;Antalgic;Trunk flexed;Decreased weight shift to right     General Gait Details: cues for RW postion from self and safety, especially with turns;   Science writer    Modified Rankin (Stroke Patients Only)       Balance                                    Cognition Arousal/Alertness: Awake/alert Behavior During Therapy: WFL for tasks assessed/performed Overall Cognitive Status: Within Functional Limits for tasks assessed                      Exercises Total Joint Exercises Ankle Circles/Pumps: AROM;Both;10 reps Quad Sets: AROM;Both;10 reps Heel Slides: AAROM;Right;10 reps Hip  ABduction/ADduction: AROM;AAROM;Right;10 reps Straight Leg Raises: AROM;Right;10 reps    General Comments        Pertinent Vitals/Pain Pain Assessment: 0-10 Pain Score: 4  Pain Location: R knee Pain Descriptors / Indicators: Sore Pain Intervention(s): Limited activity within patient's tolerance;Monitored during session;Premedicated before session;Repositioned    Home Living Family/patient expects to be discharged to:: Private residence   Available Help at Discharge: Family Type of Home: House     Home Layout: One level Home Equipment: Crutches;Cane - single point      Prior Function Level of Independence: Independent          PT Goals (current goals can now be found in the care plan section) Acute Rehab PT Goals Patient Stated Goal: home PT Goal Formulation: With patient Time For Goal Achievement: 03/16/15 Potential to Achieve Goals: Good Progress towards PT goals: Progressing toward goals    Frequency  7X/week    PT Plan Current plan remains appropriate    Co-evaluation             End of Session   Activity Tolerance: Patient tolerated treatment well;No increased pain Patient left: in bed;with call bell/phone within reach     Time: 4315-4008 PT Time Calculation (min) (ACUTE ONLY): 16 min  Charges:  $Therapeutic Exercise: 8-22 mins                    G Codes:  [facility] 03/09/2015, 5:23 PM

## 2015-03-09 NOTE — Progress Notes (Signed)
   Subjective: 1 Day Post-Op Procedure(s) (LRB): RIGHT TOTAL KNEE ARTHROPLASTY (Right) Patient reports pain as mild and moderate.   Patient seen in rounds with Dr. Wynelle Link.  Had some indigestion.  Given maylox last evening.  Will resume his Prilosec (had substitution med). Patient is well, but has had some minor complaints of pain in the knee, requiring pain medications We will start therapy today.  Plan is to go Home after hospital stay.  Objective: Vital signs in last 24 hours: Temp:  [97.2 F (36.2 C)-98.2 F (36.8 C)] 97.7 F (36.5 C) (09/10 0645) Pulse Rate:  [59-103] 96 (09/10 0645) Resp:  [13-20] 18 (09/10 0645) BP: (106-184)/(65-99) 106/92 mmHg (09/10 0645) SpO2:  [94 %-100 %] 98 % (09/10 0645) Weight:  [91.899 kg (202 lb 9.6 oz)] 91.899 kg (202 lb 9.6 oz) (09/09 1102)  Intake/Output from previous day:  Intake/Output Summary (Last 24 hours) at 03/09/15 0813 Last data filed at 03/09/15 0646  Gross per 24 hour  Intake 3401.25 ml  Output   3100 ml  Net 301.25 ml    Intake/Output this shift: UOP about 650 since around [place]  Labs:  Recent Labs  03/09/15 0602  HGB 12.1*    Recent Labs  03/09/15 0602  WBC 11.3*  RBC 4.44  HCT 36.5*  PLT 233    Recent Labs  03/09/15 0602  NA 134*  K 4.8  CL 101  CO2 28  BUN 17  CREATININE 0.99  GLUCOSE 212*  CALCIUM 8.8*   No results for input(s): LABPT, INR in the last 72 hours.  EXAM General - Patient is Alert and Appropriate Extremity - Neurovascular intact Sensation intact distally Dressing - dressing C/D/I Motor Function - intact, moving foot and toes well on exam.  Hemovac pulled without difficulty.  Past Medical History  Diagnosis Date  . Colon polyp   . Hyperlipidemia   . Wears glasses   . Hearing loss     wears hearing aid  . Esophageal reflux   . FHx: colonic polyps 11/04, 01/2009  . Fe deficiency anemia 2012  . CAD (coronary artery disease)   . Hypertension   . Gastritis   . Hypogonadism  male   . BPH (benign prostatic hyperplasia)   . Hemorrhoids, internal   . Anemia   . Heart attack   . Diabetes mellitus without complication   . Collagen vascular disease     Assessment/Plan: 1 Day Post-Op Procedure(s) (LRB): RIGHT TOTAL KNEE ARTHROPLASTY (Right) Active Problems:   Primary osteoarthritis of right knee   S/P knee replacement  Estimated body mass index is 30.81 kg/(m^2) as calculated from the following:   Height as of this encounter: 5\' 8"  (1.727 m).   Weight as of this encounter: 91.899 kg (202 lb 9.6 oz). Advance diet Up with therapy Discharge home with home health  Resume Prilosec  DVT Prophylaxis - Lovenox Weight-Bearing as tolerated to right leg D/C O2 and Pulse OX and try on Room Air  Arlee Muslim, PA-C Orthopaedic Surgery 03/09/2015, 8:13 AM

## 2015-03-10 LAB — CBC
HEMATOCRIT: 35.5 % — AB (ref 39.0–52.0)
Hemoglobin: 11.4 g/dL — ABNORMAL LOW (ref 13.0–17.0)
MCH: 26.7 pg (ref 26.0–34.0)
MCHC: 32.1 g/dL (ref 30.0–36.0)
MCV: 83.1 fL (ref 78.0–100.0)
PLATELETS: 242 10*3/uL (ref 150–400)
RBC: 4.27 MIL/uL (ref 4.22–5.81)
RDW: 13.2 % (ref 11.5–15.5)
WBC: 11.3 10*3/uL — ABNORMAL HIGH (ref 4.0–10.5)

## 2015-03-10 NOTE — Progress Notes (Signed)
Utilization Review Completed.[name] T9/04/2015  

## 2015-03-10 NOTE — Progress Notes (Signed)
Discharged from floor via w/c, belongings & family with pt. No changes in assessment. [name]  

## 2015-03-10 NOTE — Plan of Care (Signed)
Problem: Phase III Progression Outcomes Goal: Anticoagulant follow-up in place Outcome: Not Applicable Date Met:  04/29/27 asa  Problem: Discharge Progression Outcomes Goal: Anticoagulant follow-up in place Outcome: Not Applicable Date Met:  11/88/67 asa

## 2015-03-10 NOTE — Progress Notes (Signed)
   Subjective: 2 Days Post-Op Procedure(s) (LRB): RIGHT TOTAL KNEE ARTHROPLASTY (Right) Patient reports pain as mild.   Patient seen in rounds with Dr. Wynelle Link. Patient is well, and has had no acute complaints or problems Patient is ready to go home  Objective: Vital signs in last 24 hours: Temp:  [98 F (36.7 C)] 98 F (36.7 C) (09/11 0636) Pulse Rate:  [80-91] 80 (09/11 0636) Resp:  [17-18] 17 (09/11 0636) BP: (156-157)/(80-91) 156/80 mmHg (09/11 0636) SpO2:  [97 %-98 %] 98 % (09/11 0636)  Intake/Output from previous day:  Intake/Output Summary (Last 24 hours) at 03/10/15 0826 Last data filed at 03/10/15 0039  Gross per 24 hour  Intake   1695 ml  Output   3675 ml  Net  -1980 ml   Labs:  Recent Labs  03/09/15 0602 03/10/15 0556  HGB 12.1* 11.4*    Recent Labs  03/09/15 0602 03/10/15 0556  WBC 11.3* 11.3*  RBC 4.44 4.27  HCT 36.5* 35.5*  PLT 233 242    Recent Labs  03/09/15 0602  NA 134*  K 4.8  CL 101  CO2 28  BUN 17  CREATININE 0.99  GLUCOSE 212*  CALCIUM 8.8*   No results for input(s): LABPT, INR in the last 72 hours.  EXAM: General - Patient is Alert and Appropriate Extremity - Neurovascular intact Sensation intact distally Dressing - clean, dry, no drainage Motor Function - intact, moving foot and toes well on exam.   Assessment/Plan: 2 Days Post-Op Procedure(s) (LRB): RIGHT TOTAL KNEE ARTHROPLASTY (Right) Procedure(s) (LRB): RIGHT TOTAL KNEE ARTHROPLASTY (Right) Past Medical History  Diagnosis Date  . Colon polyp   . Hyperlipidemia   . Wears glasses   . Hearing loss     wears hearing aid  . Esophageal reflux   . FHx: colonic polyps 11/04, 01/2009  . Fe deficiency anemia 2012  . CAD (coronary artery disease)   . Hypertension   . Gastritis   . Hypogonadism male   . BPH (benign prostatic hyperplasia)   . Hemorrhoids, internal   . Anemia   . Heart attack   . Diabetes mellitus without complication   . Collagen vascular  disease    Active Problems:   Primary osteoarthritis of right knee   S/P knee replacement  Estimated body mass index is 30.81 kg/(m^2) as calculated from the following:   Height as of this encounter: 5\' 8"  (1.727 m).   Weight as of this encounter: 91.899 kg (202 lb 9.6 oz). Up with therapy Discharge home with home health Diet - Cardiac diet Follow up - in 2 weeks Activity - WBAT Disposition - Home Condition Upon Discharge - Good D/C Meds - See DC Summary DVT Prophylaxis - Lovenox  Arlee Muslim, PA-C Orthopaedic Surgery 03/10/2015, 8:26 AM

## 2015-03-10 NOTE — Progress Notes (Signed)
Occupational Therapy Treatment Patient Details Name: [name] MRN: [id number] DOB: 12-20-1945 Today's Date: 03/10/2015    History of present illness R TKA   OT comments  Excellent progress. completed all education regarding ADL and functional mobility for ADL. Pt safe to D/C home with intermittent S when medically stable.   Follow Up Recommendations  No OT follow up;Supervision - Intermittent    Equipment Recommendations  3 in 1 bedside comode    Recommendations for Other Services      Precautions / Restrictions Precautions Precautions: Knee Restrictions Other Position/Activity Restrictions: WBAT       Mobility Bed Mobility Overal bed mobility: Modified Independent                Transfers Overall transfer level: Modified independent                    Balance                                   ADL                                         General ADL Comments: Completed education regardig home safety and reducing risk of falls. Pt able to return demonstrate ability to complete toilet transferes and walk in shower transfers @ mod I level. Educated on importance on terminal knee extension as able to tolerate.       Vision                     Perception     Praxis      Cognition   Behavior During Therapy: [facility] for tasks assessed/performed                         Extremity/Trunk Assessment               Exercises     Shoulder Instructions       General Comments      Pertinent Vitals/ Pain       Pain Assessment: 0-10 Pain Score: 5  Pain Location: R knee Pain Descriptors / Indicators: Aching;Sore Pain Intervention(s): Limited activity within patient's tolerance;Repositioned;Ice applied;Patient requesting pain meds-RN notified  Home Living                                          Prior Functioning/Environment              Frequency       Progress  Toward Goals  OT Goals(current goals can now be found in the care plan section)  Progress towards OT goals: Goals met/education completed, patient discharged from OT  Acute Rehab OT Goals Patient Stated Goal: home OT Goal Formulation: With patient Time For Goal Achievement: 03/16/15 Potential to Achieve Goals: Good ADL Goals Pt Will Perform Grooming: with supervision;standing Pt Will Transfer to Toilet: with supervision;ambulating;bedside commode Pt Will Perform Toileting - Clothing Manipulation and hygiene: with supervision;sit to/from stand Pt Will Perform Tub/Shower Transfer: Shower transfer;3 in 1;rolling walker  Plan Discharge plan remains appropriate    Co-evaluation  End of Session Equipment Utilized During Treatment: Rolling walker   Activity Tolerance Patient tolerated treatment well   Patient Left in chair;with call bell/phone within reach   Nurse Communication Mobility status;Patient requests pain meds        Time: 0800-0824 OT Time Calculation (min): 24 min  Charges: OT Evaluation $Initial OT Evaluation Tier I: 1 Procedure OT Treatments $Self Care/Home Management : 23-37 mins  Kevante Lunt,HILLARY 03/10/2015, 8:34 AM   Maurie Boettcher, OTR/L  [phone] 03/10/2015

## 2015-03-10 NOTE — Discharge Instructions (Signed)
INSTRUCTIONS AFTER JOINT REPLACEMENT   Remove items at home which could result in a fall. This includes throw rugs or furniture in walking pathways ICE to the affected joint every three hours while awake for 30 minutes at a time, for at least the first 3-5 days, and then as needed for pain and swelling. Continue to use ice for pain and swelling. You may notice swelling that will progress down to the foot and ankle. This is normal after surgery. Elevate your leg when you are not up walking on it.  Continue to use the breathing machine you got in the hospital (incentive spirometer) which will help keep your temperature down. It is common for your temperature to cycle up and down following surgery, especially at night when you are not up moving around and exerting yourself. The breathing machine keeps your lungs expanded and your temperature down.   DIET: As you were doing prior to hospitalization, we recommend a well-balanced diet.  DRESSING / WOUND CARE / SHOWERING  Keep the surgical dressing until follow up. The dressing is water proof, so you can shower without any extra covering. IF THE DRESSING FALLS OFF or the wound gets wet inside, change the dressing with sterile gauze. Please use good hand washing techniques before changing the dressing. Do not use any lotions or creams on the incision until instructed by your surgeon.   ACTIVITY  Increase activity slowly as tolerated, but follow the weight bearing instructions below.  No driving for 6 weeks or until further direction given by your physician. You cannot drive while taking narcotics.  No lifting or carrying greater than 10 lbs. until further directed by your surgeon. Avoid periods of inactivity such as sitting longer than an hour when not asleep. This helps prevent blood clots.  You may return to work once you are authorized by your doctor.     WEIGHT BEARING   Weight bearing as tolerated with assist device (walker, cane,  etc) as directed, use it as long as suggested by your surgeon or therapist, typically at least 4-6 weeks.   EXERCISES  Results after joint replacement surgery are often greatly improved when you follow the exercise, range of motion and muscle strengthening exercises prescribed by your doctor. Safety measures are also important to protect the joint from further injury. Any time any of these exercises cause you to have increased pain or swelling, decrease what you are doing until you are comfortable again and then slowly increase them. If you have problems or questions, call your caregiver or physical therapist for advice.   Rehabilitation is important following a joint replacement. After just a few days of immobilization, the muscles of the leg can become weakened and shrink (atrophy). These exercises are designed to build up the tone and strength of the thigh and leg muscles and to improve motion. Often times heat used for twenty to thirty minutes before working out will loosen up your tissues and help with improving the range of motion but do not use heat for the first two weeks following surgery (sometimes heat can increase post-operative swelling).   These exercises can be done on a training (exercise) mat, on the floor, on a table or on a bed. Use whatever works the best and is most comfortable for you.  Use music or television while you are exercising so that the exercises are a pleasant break in your day. This will make your life better with the exercises acting as a break in your routine  that you can look forward to.  Perform all exercises about fifteen times, three times per day or as directed. You should exercise both the operative leg and the other leg as well.   Exercises include:  Quad Sets - Tighten up the muscle on the front of the thigh (Quad) and hold for 5-10 seconds.  Straight Leg Raises - With your knee straight (if you were given a brace, keep it on), lift the leg to 60  degrees, hold for 3 seconds, and slowly lower the leg. Perform this exercise against resistance later as your leg gets stronger.  Leg Slides: Lying on your back, slowly slide your foot toward your buttocks, bending your knee up off the floor (only go as far as is comfortable). Then slowly slide your foot back down until your leg is flat on the floor again.  Angel Wings: Lying on your back spread your legs to the side as far apart as you can without causing discomfort.  Hamstring Strength: Lying on your back, push your heel against the floor with your leg straight by tightening up the muscles of your buttocks. Repeat, but this time bend your knee to a comfortable angle, and push your heel against the floor. You may put a pillow under the heel to make it more comfortable if necessary.   A rehabilitation program following joint replacement surgery can speed recovery and prevent re-injury in the future due to weakened muscles. Contact your doctor or a physical therapist for more information on knee rehabilitation.    CONSTIPATION  Constipation is defined medically as fewer than three stools per week and severe constipation as less than one stool per week. Even if you have a regular bowel pattern at home, your normal regimen is likely to be disrupted due to multiple reasons following surgery. Combination of anesthesia, postoperative narcotics, change in appetite and fluid intake all can affect your bowels.   YOU MUST use at least one of the following options; they are listed in order of increasing strength to get the job done. They are all available over the counter, and you may need to use some, POSSIBLY even all of these options:   Drink plenty of fluids (prune juice may be helpful) and high fiber foods Colace 100 mg by mouth twice a day  Senokot for constipation as directed and as needed Dulcolax (bisacodyl), take with full glass of water  Miralax (polyethylene glycol) once or twice a day as  needed.  If you have tried all these things and are unable to have a bowel movement in the first 3-4 days after surgery call either your surgeon or your primary doctor.   If you experience loose stools or diarrhea, hold the medications until you stool forms back up. If your symptoms do not get better within 1 week or if they get worse, check with your doctor. If you experience "the worst abdominal pain ever" or develop nausea or vomiting, please contact the office immediately for further recommendations for treatment.   ITCHING: If you experience itching with your medications, try taking only a single pain pill, or even half a pain pill at a time. You can also use Benadryl over the counter for itching or also to help with sleep.   TED HOSE STOCKINGS: Use stockings on both legs until for at least 2 weeks or as directed by physician office. They may be removed at night for sleeping.  MEDICATIONS: See your medication summary on the After Visit Summary that nursing  will review with you. You may have some home medications which will be placed on hold until you complete the course of blood thinner medication. It is important for you to complete the blood thinner medication as prescribed.  PRECAUTIONS: If you experience chest pain or shortness of breath - call 911 immediately for transfer to the hospital emergency department.   If you develop a fever greater that 101 F, purulent drainage from wound, increased redness or drainage from wound, foul odor from the wound/dressing, or calf pain - CONTACT YOUR SURGEON.                          FOLLOW-UP APPOINTMENTS: If you do not already have a post-op appointment, please call the office for an appointment to be seen by your surgeon. Guidelines for how soon to be seen are listed in your After Visit Summary, but are typically between 1-4 weeks after surgery.  OTHER INSTRUCTIONS:   Knee Replacement: Do not place pillow  under knee, focus on keeping the knee straight while resting. CPM instructions: 0-90 degrees, 2 hours in the morning, 2 hours in the afternoon, and 2 hours in the evening. Place foam block, curve side up under heel at all times except when in CPM or when walking. DO NOT modify, tear, cut, or change the foam block in any way.  MAKE SURE YOU:  Understand these instructions.  Get help right away if you are not doing well or get worse.

## 2015-03-10 NOTE — Progress Notes (Signed)
Physical Therapy Treatment Patient Details Name: [name] MRN: [id number] DOB: September 17, 1945 Today's Date: 03/10/2015    History of Present Illness R TKA    PT Comments    Pt progressing well, occasional cues for safety, verbally reviewed up/down one step fwd and backward (pt practiced shower transfer earlier and feels comfortable with technique)  Follow Up Recommendations  Home health PT     Equipment Recommendations  Rolling walker with 5" wheels    Recommendations for Other Services       Precautions / Restrictions Precautions Precautions: Knee;Fall Precaution Comments: pt able to do I SLR, KI not used, cues to keep less than 10* lag Restrictions Weight Bearing Restrictions: No Other Position/Activity Restrictions: WBAT    Mobility  Bed Mobility Overal bed mobility: Modified Independent             General bed mobility comments: in chair.   Transfers Overall transfer level: Modified independent Equipment used: Rolling walker (2 wheeled) Transfers: Sit to/from Stand Sit to Stand: Modified independent (Device/Increase time)         General transfer comment: pt uses correct hand placement  Ambulation/Gait Ambulation/Gait assistance: Supervision Ambulation Distance (Feet): 200 Feet Assistive device: Rolling walker (2 wheeled) Gait Pattern/deviations: Step-through pattern     General Gait Details: cues for RW postion from self and safety, especially with turns, working on knee extension with terminal swing, heel strike   Stairs            Wheelchair Mobility    Modified Rankin (Stroke Patients Only)       Balance                                    Cognition Arousal/Alertness: Awake/alert Behavior During Therapy: WFL for tasks assessed/performed Overall Cognitive Status: Within Functional Limits for tasks assessed                      Exercises Total Joint Exercises Ankle Circles/Pumps: AROM;Both;10 reps Quad  Sets: AROM;Both;10 reps Heel Slides: AAROM;Right;10 reps Hip ABduction/ADduction: AROM;AAROM;Right;10 reps Straight Leg Raises: AROM;Right;10 reps Goniometric ROM: -12 to 70* flexion    General Comments        Pertinent Vitals/Pain Pain Assessment: 0-10 Pain Score: 4  Pain Location: R knee Pain Descriptors / Indicators: Sore Pain Intervention(s): Limited activity within patient's tolerance;Monitored during session;Premedicated before session;Ice applied    Home Living                      Prior Function            PT Goals (current goals can now be found in the care plan section) Acute Rehab PT Goals Patient Stated Goal: home PT Goal Formulation: With patient Time For Goal Achievement: 03/16/15 Potential to Achieve Goals: Good Progress towards PT goals: Progressing toward goals    Frequency  7X/week    PT Plan Current plan remains appropriate    Co-evaluation             End of Session   Activity Tolerance: Patient tolerated treatment well Patient left: in chair;with call bell/phone within reach     Time: 0916-0943 PT Time Calculation (min) (ACUTE ONLY): 27 min  Charges:  $Gait Training: 8-22 mins $Therapeutic Exercise: 8-22 mins                    G Codes:  [facility] 03/10/2015, 9:47 AM

## 2015-03-11 ENCOUNTER — Encounter (HOSPITAL_COMMUNITY): Payer: Self-pay | Admitting: Specialist

## 2015-03-11 ENCOUNTER — Other Ambulatory Visit: Payer: Self-pay | Admitting: *Deleted

## 2015-03-11 DIAGNOSIS — I77810 Thoracic aortic ectasia: Secondary | ICD-10-CM

## 2015-03-14 NOTE — Discharge Summary (Signed)
Physician Discharge Summary   Patient ID: [name] MRN: [id number] DOB/AGE: [birth year] 69 y.o.  Admit date: 03/08/2015 Discharge date: 03/10/2015  Primary Diagnosis:  RIGHT KNEE OA  Admission Diagnoses:  Past Medical History  Diagnosis Date  . Colon polyp   . Hyperlipidemia   . Wears glasses   . Hearing loss     wears hearing aid  . Esophageal reflux   . FHx: colonic polyps 11/04, 01/2009  . Fe deficiency anemia 2012  . CAD (coronary artery disease)   . Hypertension   . Gastritis   . Hypogonadism male   . BPH (benign prostatic hyperplasia)   . Hemorrhoids, internal   . Anemia   . Heart attack   . Diabetes mellitus without complication   . Collagen vascular disease    Discharge Diagnoses:   Active Problems:   Primary osteoarthritis of right knee   S/P knee replacement  Estimated body mass index is 30.81 kg/(m^2) as calculated from the following:   Height as of this encounter: '5\' 8"'  (1.727 m).   Weight as of this encounter: 91.899 kg (202 lb 9.6 oz).  Procedure:  Procedure(s) (LRB): RIGHT TOTAL KNEE ARTHROPLASTY (Right)   Consults: None  HPI: [name] is a 69 y.o. year old male with end stage bone on bone arthritis of the knee who failed conservative treatment and elected for Total Knee Arthroplasty.   The risks, benefits, and alternatives were discussed at length including but not limited to the risks of infection, bleeding, nerve injury, stiffness, blood clots, the need for revision surgery, cardiopulmonary complications, among others, and they were willing to proceed.  Laboratory Data: Admission on 03/08/2015, Discharged on 03/10/2015  Component Date Value Ref Range Status  . Glucose-Capillary 03/08/2015 145* 65 - 99 mg/dL Final  . Glucose-Capillary 03/08/2015 138* 65 - 99 mg/dL Final  . Sodium 03/09/2015 134* 135 - 145 mmol/L Final  . Potassium 03/09/2015 4.8  3.5 - 5.1 mmol/L Final  . Chloride 03/09/2015 101  101 - 111 mmol/L Final  . CO2 03/09/2015  28  22 - 32 mmol/L Final  . Glucose, Bld 03/09/2015 212* 65 - 99 mg/dL Final  . BUN 03/09/2015 17  6 - 20 mg/dL Final  . Creatinine, Ser 03/09/2015 0.99  0.61 - 1.24 mg/dL Final  . Calcium 03/09/2015 8.8* 8.9 - 10.3 mg/dL Final  . GFR calc non Af Amer 03/09/2015 >60  >60 mL/min Final  . GFR calc Af Amer 03/09/2015 >60  >60 mL/min Final   Comment: (NOTE) The eGFR has been calculated using the CKD EPI equation. This calculation has not been validated in all clinical situations. eGFR's persistently <60 mL/min signify possible Chronic Kidney Disease.   . Anion gap 03/09/2015 5  5 - 15 Final  . WBC 03/09/2015 11.3* 4.0 - 10.5 K/uL Final  . RBC 03/09/2015 4.44  4.22 - 5.81 MIL/uL Final  . Hemoglobin 03/09/2015 12.1* 13.0 - 17.0 g/dL Final  . HCT 03/09/2015 36.5* 39.0 - 52.0 % Final  . MCV 03/09/2015 82.2  78.0 - 100.0 fL Final  . MCH 03/09/2015 27.3  26.0 - 34.0 pg Final  . MCHC 03/09/2015 33.2  30.0 - 36.0 g/dL Final  . RDW 03/09/2015 12.9  11.5 - 15.5 % Final  . Platelets 03/09/2015 233  150 - 400 K/uL Final  . WBC 03/10/2015 11.3* 4.0 - 10.5 K/uL Final  . RBC 03/10/2015 4.27  4.22 - 5.81 MIL/uL Final  . Hemoglobin 03/10/2015 11.4* 13.0 - 17.0 g/dL Final  .  HCT 03/10/2015 35.5* 39.0 - 52.0 % Final  . MCV 03/10/2015 83.1  78.0 - 100.0 fL Final  . MCH 03/10/2015 26.7  26.0 - 34.0 pg Final  . MCHC 03/10/2015 32.1  30.0 - 36.0 g/dL Final  . RDW 03/10/2015 13.2  11.5 - 15.5 % Final  . Platelets 03/10/2015 242  150 - 400 K/uL Final  Hospital Outpatient Visit on 03/01/2015  Component Date Value Ref Range Status  . Color, Urine 03/01/2015 YELLOW  YELLOW Final  . APPearance 03/01/2015 CLEAR  CLEAR Final  . Specific Gravity, Urine 03/01/2015 1.017  1.005 - 1.030 Final  . pH 03/01/2015 7.5  5.0 - 8.0 Final  . Glucose, UA 03/01/2015 NEGATIVE  NEGATIVE mg/dL Final  . Hgb urine dipstick 03/01/2015 NEGATIVE  NEGATIVE Final  . Bilirubin Urine 03/01/2015 NEGATIVE  NEGATIVE Final  . Ketones, ur  03/01/2015 NEGATIVE  NEGATIVE mg/dL Final  . Protein, ur 03/01/2015 NEGATIVE  NEGATIVE mg/dL Final  . Urobilinogen, UA 03/01/2015 1.0  0.0 - 1.0 mg/dL Final  . Nitrite 03/01/2015 NEGATIVE  NEGATIVE Final  . Leukocytes, UA 03/01/2015 NEGATIVE  NEGATIVE Final   MICROSCOPIC NOT DONE ON URINES WITH NEGATIVE PROTEIN, BLOOD, LEUKOCYTES, NITRITE, OR GLUCOSE <1000 mg/dL.  [name] MRSA, PCR 03/01/2015 NEGATIVE  NEGATIVE Final  . Staphylococcus aureus 03/01/2015 POSITIVE* NEGATIVE Final   Comment:        The Xpert SA Assay (FDA approved for NASAL specimens in patients over 71 years of age), is one component of a comprehensive surveillance program.  Test performance has been validated by [facility] for patients greater than or equal to 33 year old. It is not intended to diagnose infection nor to guide or monitor treatment.   [name] aPTT 03/01/2015 30  24 - 37 seconds Final  . WBC 03/01/2015 5.5  4.0 - 10.5 K/uL Final  . RBC 03/01/2015 5.04  4.22 - 5.81 MIL/uL Final  . Hemoglobin 03/01/2015 13.6  13.0 - 17.0 g/dL Final  . HCT 03/01/2015 41.9  39.0 - 52.0 % Final  . MCV 03/01/2015 83.1  78.0 - 100.0 fL Final  . MCH 03/01/2015 27.0  26.0 - 34.0 pg Final  . MCHC 03/01/2015 32.5  30.0 - 36.0 g/dL Final  . RDW 03/01/2015 13.2  11.5 - 15.5 % Final  . Platelets 03/01/2015 248  150 - 400 K/uL Final  . Prothrombin Time 03/01/2015 13.8  11.6 - 15.2 seconds Final  . INR 03/01/2015 1.04  0.00 - 1.49 Final  . ABO/RH(D) 03/01/2015 A POS   Final  . Antibody Screen 03/01/2015 NEG   Final  . Sample Expiration 03/01/2015 03/11/2015   Final  . ABO/RH(D) 03/01/2015 A POS   Final  Appointment on 03/01/2015  Component Date Value Ref Range Status  . Sodium 03/01/2015 138  135 - 145 mEq/L Final  . Potassium 03/01/2015 3.9  3.5 - 5.1 mEq/L Final  . Chloride 03/01/2015 100  96 - 112 mEq/L Final  . CO2 03/01/2015 30  19 - 32 mEq/L Final  . Glucose, Bld 03/01/2015 116* 70 - 99 mg/dL Final  . BUN 03/01/2015 18  6 - 23  mg/dL Final  . Creatinine, Ser 03/01/2015 1.12  0.40 - 1.50 mg/dL Final  . Calcium 03/01/2015 9.4  8.4 - 10.5 mg/dL Final  . GFR 03/01/2015 68.65  >60.00 mL/min Final     X-Rays:Ct Angio Chest Aorta W/cm &/or Wo/cm  03/06/2015   ADDENDUM REPORT: 03/06/2015 08:23  ADDENDUM: Recommend annual imaging followup by CTA  or MRA. This recommendation follows 2010 ACCF/AHA/AATS/ACR/ASA/SCA/SCAI/SIR/STS/SVM Guidelines for the Diagnosis and Management of Patients with Thoracic Aortic Disease. Circulation. 2010; 121: G536-I680   Electronically Signed   By: Inez Catalina M.D.   On: 03/06/2015 08:23   03/06/2015   CLINICAL DATA:  Recent cardiac stent placement with findings of aortic root dilatation, preoperative evaluation for upcoming knee surgery  EXAM: CT ANGIOGRAPHY CHEST WITH CONTRAST  TECHNIQUE: Multidetector CT imaging of the chest was performed using the standard protocol during bolus administration of intravenous contrast. Multiplanar CT image reconstructions and MIPs were obtained to evaluate the vascular anatomy.  CONTRAST:  163m OMNIPAQUE IOHEXOL 350 MG/ML SOLN  COMPARISON:  None.  FINDINGS: The lungs are well aerated bilaterally and demonstrate mild emphysematous changes. No focal infiltrate, effusion or sizable parenchymal nodule is noted.  The thoracic inlet is within normal limits. The left vertebral artery arises directly from the thoracic aorta. Aortic calcifications are noted. Additionally some mild atherosclerotic ulcerations are seen particularly in the region of the aortic arch. At the level of the main pulmonary artery best seen on image number 40 of series 5, the ascending aorta measures 4.3 x 4.2 cm in greatest dimension. It measures approximately 3.6 cm at the sino-tubular junction. No focal dissection is noted. Calcific changes are noted throughout the aorta. The descending thoracic aorta tapers in a normal fashion.  The visualized visceral vessels in the upper abdomen are within normal limits with  the exception of some calcification at their origins. The pulmonary artery shows a normal branching pattern. No pulmonary emboli are seen. A small sliding-type hiatal hernia is noted. Moderate coronary calcifications are seen. No sizable hilar or mediastinal adenopathy is noted.  The visualized upper abdomen reveals evidence of a left renal cysts medially. The bony structures show degenerative change of the thoracic spine.  Review of the MIP images confirms the above findings.  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 Atherosclerotic calcifications are noted.  Moderate coronary calcifications.  Left renal cyst.  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  Electronically Signed: By: MInez CatalinaM.D. On: 03/05/2015 16:22    EKG: Orders placed or performed in visit on 01/16/15  . EKG 12-Lead     Hospital Course: CHolley Wirtis a 69y[facility]o. [facility] were brought to the operating room on 03/08/2015 and underwent Procedure(s): RIGHT TOTAL KNEE ARTHROPLASTY.  Patient tolerated the procedure well and was later transferred to the recovery room and then to the orthopaedic floor for postoperative care.  They were given PO and IV analgesics for pain control following their surgery.  They were given 24 hours of postoperative antibiotics of  Anti-infectives    Start     Dose/Rate Route Frequency Ordered Stop   03/08/15 2100  ceFAZolin (ANCEF) IVPB 2 g/50 mL premix     2 g 100 mL/hr over 30 Minutes Intravenous Every 6 hours 03/08/15 1950 03/09/15 0426   03/08/15 1101  ceFAZolin (ANCEF) IVPB 2 g/50 mL premix     2 g 100 mL/hr over 30 Minutes Intravenous On call to O.R. 03/08/15 1101 03/08/15 1454     and started on DVT prophylaxis in the form of Lovenox.   PT and OT were ordered for total joint protocol.  Discharge planning consulted to help with postop disposition and equipment needs.  Patient had a tough  night on the evening of surgery.  They started  to get up OOB with therapy on day one. Hemovac drain was pulled without difficulty.  Patient seen in rounds with Dr. Wynelle Link. Had some indigestion. Given maylox that evening of surgery. Resumed his Prilosec (had substitution med).  Continued to work with therapy into day two.  Dressing was changed on day two and the dressing (Aquacel) was clean and dry.  Patient was seen in rounds by Dr. Synthia Innocent and was ready to go home that same day.  Discharge home with home health Diet - Cardiac diet Follow up - in 2 weeks Activity - WBAT Disposition - Home Condition Upon Discharge - Good D/C Meds - See DC Summary DVT Prophylaxis - Lovenox  Discharge Instructions    Call MD / Call 911    Complete by:  As directed   If you experience chest pain or shortness of breath, CALL 911 and be transported to the hospital emergency room.  If you develope a fever above 101 F, pus (white drainage) or increased drainage or redness at the wound, or calf pain, call your surgeon's office.     Call MD / Call 911    Complete by:  As directed   If you experience chest pain or shortness of breath, CALL 911 and be transported to the hospital emergency room.  If you develope a fever above 101 F, pus (white drainage) or increased drainage or redness at the wound, or calf pain, call your surgeon's office.     Constipation Prevention    Complete by:  As directed   Drink plenty of fluids.  Prune juice may be helpful.  You may use a stool softener, such as Colace (over the counter) 100 mg twice a day.  Use MiraLax (over the counter) for constipation as needed.     Diet - low sodium heart healthy    Complete by:  As directed      Diet - low sodium heart healthy    Complete by:  As directed      Discharge instructions    Complete by:  As directed   INSTRUCTIONS AFTER JOINT REPLACEMENT   Remove items at home which could result in a fall. This includes throw rugs or furniture in walking  pathways ICE to the affected joint every three hours while awake for 30 minutes at a time, for at least the first 3-5 days, and then as needed for pain and swelling.  Continue to use ice for pain and swelling. You may notice swelling that will progress down to the foot and ankle.  This is normal after surgery.  Elevate your leg when you are not up walking on it.   Continue to use the breathing machine you got in the hospital (incentive spirometer) which will help keep your temperature down.  It is common for your temperature to cycle up and down following surgery, especially at night when you are not up moving around and exerting yourself.  The breathing machine keeps your lungs expanded and your temperature down.   DIET:  As you were doing prior to hospitalization, we recommend a well-balanced diet.  DRESSING / WOUND CARE / SHOWERING  Keep the surgical dressing until follow up.  The dressing is water proof, so you can shower without any extra covering.  IF THE DRESSING FALLS OFF or the wound gets wet inside, change the dressing with sterile gauze.  Please use good hand washing techniques before changing the dressing.  Do not use any lotions or creams on the incision  until instructed by your surgeon.    ACTIVITY  Increase activity slowly as tolerated, but follow the weight bearing instructions below.   No driving for 6 weeks or until further direction given by your physician.  You cannot drive while taking narcotics.  No lifting or carrying greater than 10 lbs. until further directed by your surgeon. Avoid periods of inactivity such as sitting longer than an hour when not asleep. This helps prevent blood clots.  You may return to work once you are authorized by your doctor.     WEIGHT BEARING   Weight bearing as tolerated with assist device (walker, cane, etc) as directed, use it as long as suggested by your surgeon or therapist, typically at least 4-6 weeks.   EXERCISES  Results after  joint replacement surgery are often greatly improved when you follow the exercise, range of motion and muscle strengthening exercises prescribed by your doctor. Safety measures are also important to protect the joint from further injury. Any time any of these exercises cause you to have increased pain or swelling, decrease what you are doing until you are comfortable again and then slowly increase them. If you have problems or questions, call your caregiver or physical therapist for advice.   Rehabilitation is important following a joint replacement. After just a few days of immobilization, the muscles of the leg can become weakened and shrink (atrophy).  These exercises are designed to build up the tone and strength of the thigh and leg muscles and to improve motion. Often times heat used for twenty to thirty minutes before working out will loosen up your tissues and help with improving the range of motion but do not use heat for the first two weeks following surgery (sometimes heat can increase post-operative swelling).   These exercises can be done on a training (exercise) mat, on the floor, on a table or on a bed. Use whatever works the best and is most comfortable for you.    Use music or television while you are exercising so that the exercises are a pleasant break in your day. This will make your life better with the exercises acting as a break in your routine that you can look forward to.   Perform all exercises about fifteen times, three times per day or as directed.  You should exercise both the operative leg and the other leg as well.   Exercises include:   Quad Sets - Tighten up the muscle on the front of the thigh (Quad) and hold for 5-10 seconds.   Straight Leg Raises - With your knee straight (if you were given a brace, keep it on), lift the leg to 60 degrees, hold for 3 seconds, and slowly lower the leg.  Perform this exercise against resistance later as your leg gets stronger.  Leg Slides:  Lying on your back, slowly slide your foot toward your buttocks, bending your knee up off the floor (only go as far as is comfortable). Then slowly slide your foot back down until your leg is flat on the floor again.  Angel Wings: Lying on your back spread your legs to the side as far apart as you can without causing discomfort.  Hamstring Strength:  Lying on your back, push your heel against the floor with your leg straight by tightening up the muscles of your buttocks.  Repeat, but this time bend your knee to a comfortable angle, and push your heel against the floor.  You may put a pillow under  the heel to make it more comfortable if necessary.   A rehabilitation program following joint replacement surgery can speed recovery and prevent re-injury in the future due to weakened muscles. Contact your doctor or a physical therapist for more information on knee rehabilitation.    CONSTIPATION  Constipation is defined medically as fewer than three stools per week and severe constipation as less than one stool per week.  Even if you have a regular bowel pattern at home, your normal regimen is likely to be disrupted due to multiple reasons following surgery.  Combination of anesthesia, postoperative narcotics, change in appetite and fluid intake all can affect your bowels.   YOU MUST use at least one of the following options; they are listed in order of increasing strength to get the job done.  They are all available over the counter, and you may need to use some, POSSIBLY even all of these options:    Drink plenty of fluids (prune juice may be helpful) and high fiber foods Colace 100 mg by mouth twice a day  Senokot for constipation as directed and as needed Dulcolax (bisacodyl), take with full glass of water  Miralax (polyethylene glycol) once or twice a day as needed.  If you have tried all these things and are unable to have a bowel movement in the first 3-4 days after surgery call either your  surgeon or your primary doctor.    If you experience loose stools or diarrhea, hold the medications until you stool forms back up.  If your symptoms do not get better within 1 week or if they get worse, check with your doctor.  If you experience "the worst abdominal pain ever" or develop nausea or vomiting, please contact the office immediately for further recommendations for treatment.   ITCHING:  If you experience itching with your medications, try taking only a single pain pill, or even half a pain pill at a time.  You can also use Benadryl over the counter for itching or also to help with sleep.   TED HOSE STOCKINGS:  Use stockings on both legs until for at least 2 weeks or as directed by physician office. They may be removed at night for sleeping.  MEDICATIONS:  See your medication summary on the "After Visit Summary" that nursing will review with you.  You may have some home medications which will be placed on hold until you complete the course of blood thinner medication.  It is important for you to complete the blood thinner medication as prescribed.  PRECAUTIONS:  If you experience chest pain or shortness of breath - call 911 immediately for transfer to the hospital emergency department.   If you develop a fever greater that 101 F, purulent drainage from wound, increased redness or drainage from wound, foul odor from the wound/dressing, or calf pain - CONTACT YOUR SURGEON.                                                   FOLLOW-UP APPOINTMENTS:  If you do not already have a post-op appointment, please call the office for an appointment to be seen by your surgeon.  Guidelines for how soon to be seen are listed in your "After Visit Summary", but are typically between 1-4 weeks after surgery.  OTHER INSTRUCTIONS:   Knee Replacement:  Do not place pillow  under knee, focus on keeping the knee straight while resting. CPM instructions: 0-90 degrees, 2 hours in the morning, 2 hours in the  afternoon, and 2 hours in the evening. Place foam block, curve side up under heel at all times except when in CPM or when walking.  DO NOT modify, tear, cut, or change the foam block in any way.  MAKE SURE YOU:  Understand these instructions.  Get help right away if you are not doing well or get worse.    Thank you for letting us be a part of your medical care team.  It is a privilege we respect greatly.  We hope these instructions will help you stay on track for a fast and full recovery!     Discharge instructions    Complete by:  As directed   .     Increase activity slowly as tolerated    Complete by:  As directed      Increase activity slowly as tolerated    Complete by:  As directed      Patient may shower    Complete by:  As directed   You may shower without a dressing once there is no drainage.  Do not wash over the wound.  If drainage remains, do not shower until drainage stops.     Weight bearing as tolerated    Complete by:  As directed   Laterality:  right  Extremity:  Lower            Medication List    STOP taking these medications        aspirin 81 MG tablet  Replaced by:  aspirin EC 325 MG tablet      TAKE these medications        aspirin EC 325 MG tablet  Take 1 tablet (325 mg total) by mouth 2 (two) times daily.     baclofen 10 MG tablet  Commonly known as:  LIORESAL  Take 1 tablet (10 mg total) by mouth every 8 (eight) hours as needed for muscle spasms.     chlorthalidone 25 MG tablet  Commonly known as:  HYGROTON  Take 25 mg by mouth every morning.     JANUVIA 50 MG tablet  Generic drug:  sitaGLIPtin  Take 50 mg by mouth every morning.     lisinopril 20 MG tablet  Commonly known as:  PRINIVIL,ZESTRIL  Take 20 mg by mouth every morning.     multivitamin with minerals tablet  Take 1 tablet by mouth every morning.     omeprazole 20 MG capsule  Commonly known as:  PRILOSEC  Take 20 mg by mouth daily.     oxyCODONE-acetaminophen 5-325 MG  per tablet  Commonly known as:  ROXICET  Take 1-2 tablets by mouth every 4 (four) hours as needed for severe pain.     PRESCRIPTION MEDICATION  Apply 1 application topically every morning. Testosterone cream.     rosuvastatin 40 MG tablet  Commonly known as:  CRESTOR  Take 40 mg by mouth every morning.           Follow-up Information    Follow up with [facility].   Why:  Home Health Physical Therapy   Contact information:   [address] [place] [address] [phone]       Signed: Arlee Muslim, PA-C Orthopaedic Surgery 03/14/2015, 10:08 AM

## 2015-04-18 ENCOUNTER — Other Ambulatory Visit (HOSPITAL_COMMUNITY): Payer: BLUE CROSS/BLUE SHIELD

## 2015-08-29 ENCOUNTER — Other Ambulatory Visit: Payer: Self-pay | Admitting: *Deleted

## 2015-08-29 DIAGNOSIS — I2510 Atherosclerotic heart disease of native coronary artery without angina pectoris: Secondary | ICD-10-CM

## 2015-09-03 ENCOUNTER — Other Ambulatory Visit (INDEPENDENT_AMBULATORY_CARE_PROVIDER_SITE_OTHER): Payer: BLUE CROSS/BLUE SHIELD | Admitting: *Deleted

## 2015-09-03 DIAGNOSIS — I2510 Atherosclerotic heart disease of native coronary artery without angina pectoris: Secondary | ICD-10-CM

## 2015-09-03 LAB — BASIC METABOLIC PANEL
BUN: 13 mg/dL (ref 7–25)
CHLORIDE: 100 mmol/L (ref 98–110)
CO2: 29 mmol/L (ref 20–31)
Calcium: 9.4 mg/dL (ref 8.6–10.3)
Creat: 1.12 mg/dL (ref 0.70–1.18)
Glucose, Bld: 104 mg/dL — ABNORMAL HIGH (ref 65–99)
POTASSIUM: 4.2 mmol/L (ref 3.5–5.3)
SODIUM: 137 mmol/L (ref 135–146)

## 2015-09-05 ENCOUNTER — Ambulatory Visit (INDEPENDENT_AMBULATORY_CARE_PROVIDER_SITE_OTHER)
Admission: RE | Admit: 2015-09-05 | Discharge: 2015-09-05 | Disposition: A | Discharge status: Home / Self Care | Payer: BLUE CROSS/BLUE SHIELD | Source: Ambulatory Visit | Attending: Internal Medicine | Admitting: Internal Medicine

## 2015-09-05 DIAGNOSIS — I77819 Aortic ectasia, unspecified site: Secondary | ICD-10-CM | POA: Diagnosis not present

## 2015-09-05 DIAGNOSIS — I77810 Thoracic aortic ectasia: Secondary | ICD-10-CM

## 2015-09-05 MED ORDER — IOHEXOL 350 MG/ML SOLN
100.0000 mL | Freq: Once | INTRAVENOUS | Status: AC | PRN
  Administered 2015-09-05: 100 mL via INTRAVENOUS

## 2015-09-20 ENCOUNTER — Other Ambulatory Visit: Payer: BLUE CROSS/BLUE SHIELD

## 2015-10-11 ENCOUNTER — Ambulatory Visit: Payer: BLUE CROSS/BLUE SHIELD | Admitting: Internal Medicine

## 2015-10-14 ENCOUNTER — Ambulatory Visit (INDEPENDENT_AMBULATORY_CARE_PROVIDER_SITE_OTHER): Payer: BLUE CROSS/BLUE SHIELD | Admitting: Internal Medicine

## 2015-10-14 ENCOUNTER — Encounter: Payer: Self-pay | Admitting: Internal Medicine

## 2015-10-14 VITALS — BP 152/88 | HR 65 | Ht 68.0 in | Wt 202.4 lb

## 2015-10-14 DIAGNOSIS — I25709 Atherosclerosis of coronary artery bypass graft(s), unspecified, with unspecified angina pectoris: Secondary | ICD-10-CM | POA: Diagnosis not present

## 2015-10-14 DIAGNOSIS — R000 Tachycardia, unspecified: Secondary | ICD-10-CM | POA: Diagnosis not present

## 2015-10-14 DIAGNOSIS — I7789 Other specified disorders of arteries and arterioles: Secondary | ICD-10-CM

## 2015-10-14 MED ORDER — METOPROLOL SUCCINATE ER 25 MG PO TB24: 25.0000 mg | ORAL_TABLET | Freq: Every day | ORAL | Status: DC

## 2015-10-14 NOTE — Progress Notes (Signed)
Patient Care Team: [name], MD as PCP - General (Family Medicine)   HPI  [name] is a 70 y.o. male Seen in followup for a history of a catheterization in 2008 demonstrate severe LAD disease and moderate circumflex disease with an occluded posterolateral branch. He had normal LV function with perfusion abnormality on Myoview scan is wanting to that distribution. He underwent DES stenting  The patient denies chest pain, shortness of breath, nocturnal dyspnea, orthopnea or peripheral edema.  There have been no palpitations, lightheadedness or syncope.       2013 he underwent echocardiogram ordered by his PCP. This demonstrated normal left ventricular function, mild left ventricular hypertrophy Past Medical History  Diagnosis Date  . Colon polyp   . Hyperlipidemia   . Wears glasses   . Hearing loss     wears hearing aid  . Esophageal reflux   . FHx: colonic polyps 11/04, 01/2009  . Fe deficiency anemia 2012  . CAD (coronary artery disease)   . Hypertension   . Gastritis   . Hypogonadism male   . BPH (benign prostatic hyperplasia)   . Hemorrhoids, internal   . Anemia   . Heart attack ([place])   . Diabetes mellitus without complication ([place])   . Collagen vascular disease [facility])     Past Surgical History  Procedure Laterality Date  . Hand surgery      left 2010, right 2012  . Knee surgery  1994    left  . Coronary angioplasty with stent placement  2007  . Left shoulder    . Appendectomy    . Left knee      x3  . Vasectomy    . Prostate surgery    . Right cts      ?  [name] Rotator cuff repair Right 12/2012  . Total knee arthroplasty Right 03/08/2015    Procedure: RIGHT TOTAL KNEE ARTHROPLASTY;  Surgeon: Sydnee Cabal, MD;  Location: [facility];  Service: Orthopedics;  Laterality: Right;    Current Outpatient Prescriptions  Medication Sig Dispense Refill  . aspirin 325 MG EC tablet Take 325 mg by mouth daily.    . baclofen (LIORESAL) 10 MG tablet Take 1  tablet (10 mg total) by mouth every 8 (eight) hours as needed for muscle spasms. 50 each 2  . chlorthalidone (HYGROTON) 25 MG tablet Take 25 mg by mouth every morning.     [name] JANUVIA 50 MG tablet Take 50 mg by mouth every morning.  0  . lisinopril (PRINIVIL,ZESTRIL) 20 MG tablet Take 20 mg by mouth every morning.    . Multiple Vitamins-Minerals (MULTIVITAMIN WITH MINERALS) tablet Take 1 tablet by mouth every morning.     [name] omeprazole (PRILOSEC) 20 MG capsule Take 20 mg by mouth daily.      [name] PRESCRIPTION MEDICATION Apply 1 application topically every morning. Testosterone cream.    . rosuvastatin (CRESTOR) 40 MG tablet Take 40 mg by mouth every morning.     No current facility-administered medications for this visit.    Allergies  Allergen Reactions  . Clindamycin Hcl Itching  . Metoprolol Succinate     Pt does not recall.     Review of Systems negative except from HPI and PMH  Physical Exam BP 152/88 mmHg  Pulse 65  Ht 5\' 8"  (1.727 m)  Wt 202 lb 6.4 oz (91.808 kg)  BMI 30.78 kg/m2 Well developed and well nourished in no acute distress HENT normal E scleral  and icterus clear Neck Supple JVP flat; carotids brisk and full Clear to ausculation  Regular rate and rhythm, no murmurs gallops or rub Soft with active bowel sounds No clubbing cyanosis  Edema Alert and oriented, grossly normal motor and sensory function Skin Warm and Dry  ECG  NSR  65 intervals 20/11/38 Probable IMI  Assessment and  Plan  CAD  S/p stenting  Ascending aortic aneurysm   Hypertension    Stable  With out symptoms of CAD   Blood pressures elevated. We will add a beta blocker in the context of his ascending aortic aneurysm  Imaging studies were reviewed today; ascending parameters are stable at 4.3  Repeat imaging is recommended one year

## 2015-10-14 NOTE — Patient Instructions (Signed)
Medication Instructions: 1) Decrease aspirin to 81 mg once daily 2) Start metoprolol succinate 25 mg one tablet by mouth once daily  Labwork: - none  Procedures/Testing: - none  Follow-Up: - Your physician wants you to follow-up in: 1 year with Dr. Caryl Comes. You will receive a reminder letter in the mail two months in advance. If you don't receive a letter, please call our office to schedule the follow-up appointment.  Any Additional Special Instructions Will Be Listed Below (If Applicable).     If you need a refill on your cardiac medications before your next appointment, please call your pharmacy.

## 2015-10-17 ENCOUNTER — Telehealth: Payer: Self-pay | Admitting: Internal Medicine

## 2015-10-17 NOTE — Telephone Encounter (Signed)
Attempted to call [name] back. Voice mail states the office is closed at 4:58 pm. Will forward to triage to have them try to call back tomorrow.

## 2015-10-17 NOTE — Telephone Encounter (Signed)
New Message:   Please call,concerning his blood pressure medicine.

## 2015-10-18 NOTE — Telephone Encounter (Signed)
Spoke with Mickel Baas and patient is currently taking Losartan 100 mg, Amlodipine 10 mg, and Maxzide 25 mg that Dr [name] prescribed. None of which are on his medication list. Mickel Baas was asking about the new medication Chlorthalidone that was added. Reviewed last ov and Chlorthalidone was already on medication list and Metoprolol was added. Per Mickel Baas patient has not picked up new medication. Explained to Mickel Baas medication lists are reviewed with patient at time of visit and can only go by what patient says they are taking if they do not bring their bottles. Mickel Baas was going to call patients pharmacy and find out exactly what the patient was taking and call back.

## 2015-10-23 ENCOUNTER — Encounter: Payer: Self-pay | Admitting: Internal Medicine

## 2015-10-23 ENCOUNTER — Encounter: Payer: Self-pay | Admitting: *Deleted

## 2015-10-23 NOTE — Progress Notes (Signed)
Updated medication list received from Dr. Deboraha Sprang office dated 10/18/15. The patient's medication list has been updated to reflect what was received from Dr. [name] as this was after the patient's appointment with Dr. Caryl Comes. Copy of the updated medication list will be mailed to the patient to review and confirm this is exactly what he is taking.

## 2016-07-24 IMAGING — CT CT ANGIO CHEST
2 of 8 series · 18 of 46 positions shown · IV contrast (OMNI)
Comparison: None.

ADDENDUM:
Recommend annual imaging followup by CTA or MRA. This recommendation
follows 9868 ACCF/AHA/AATS/ACR/ASA/SCA/FLOCK/JNR GH/SANJIBAN/BACK Guidelines
for the Diagnosis and Management of Patients with Thoracic Aortic
Disease. Circulation. 9868; 121: [id number]
CLINICAL DATA: Recent cardiac stent placement with findings of
aortic root dilatation, preoperative evaluation for upcoming knee
surgery

EXAM:
CT ANGIOGRAPHY CHEST WITH CONTRAST
TECHNIQUE: Multidetector CT imaging of the chest was performed using the
standard protocol during bolus administration of intravenous
contrast. Multiplanar CT image reconstructions and MIPs were
obtained to evaluate the vascular anatomy.
CONTRAST:  100mL OMNIPAQUE IOHEXOL 350 MG/ML SOLN

[Series 5: dissection 3.0 i30f 3 · axial · 0.78mm/px · z∈[-329,-29]mm · 15 of 114 slices shown]
[im 7/114  lung]
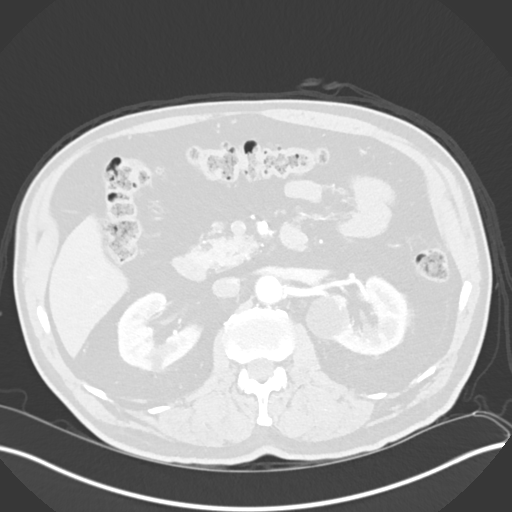
[im 13/114  soft-tissue]
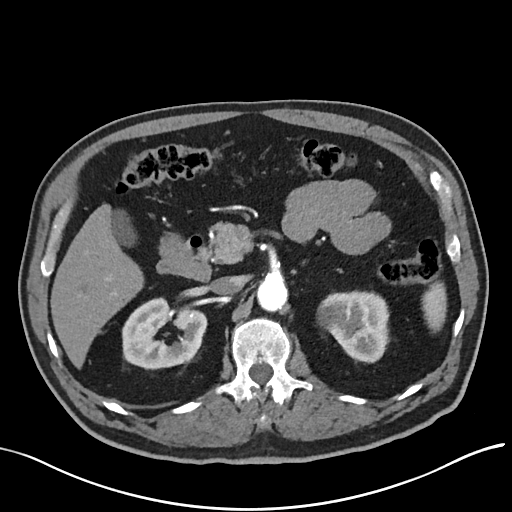
[im 19/114  lung]
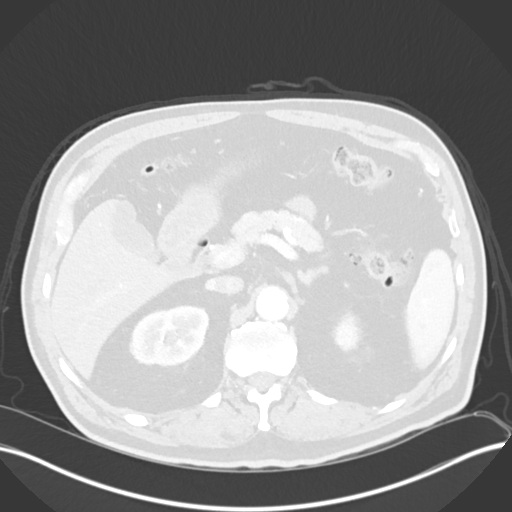
[im 26/114  soft-tissue]
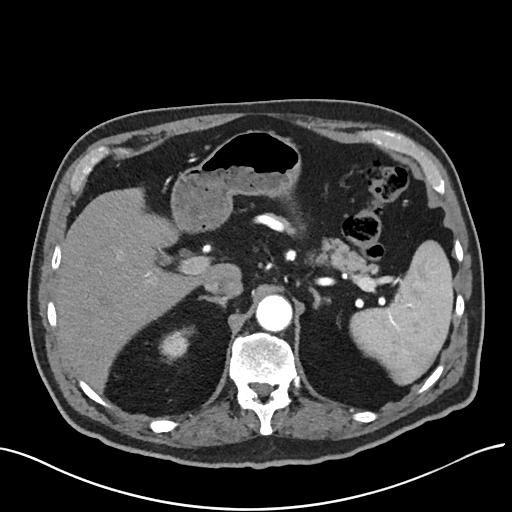
[im 38/114  lung]
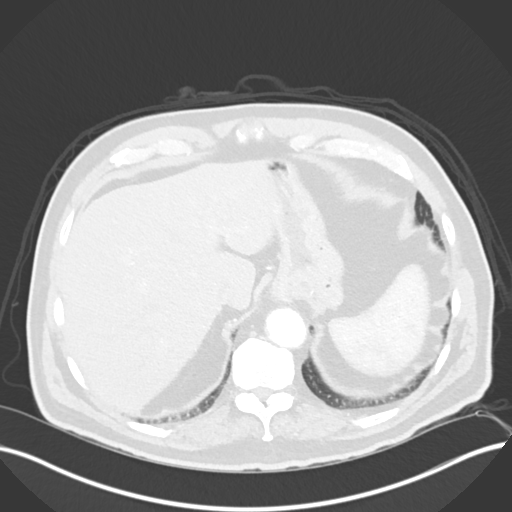
[im 44/114  soft-tissue]
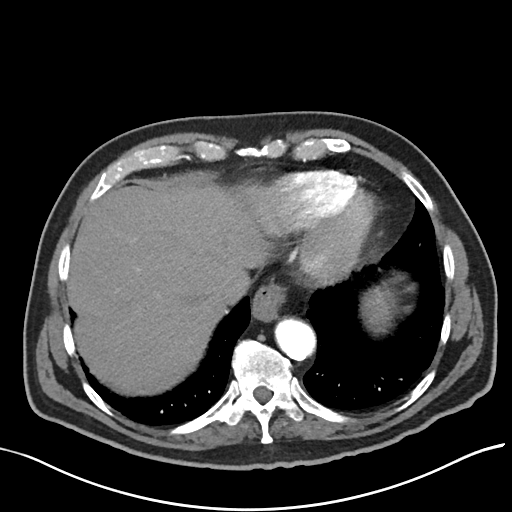
[im 51/114  lung]
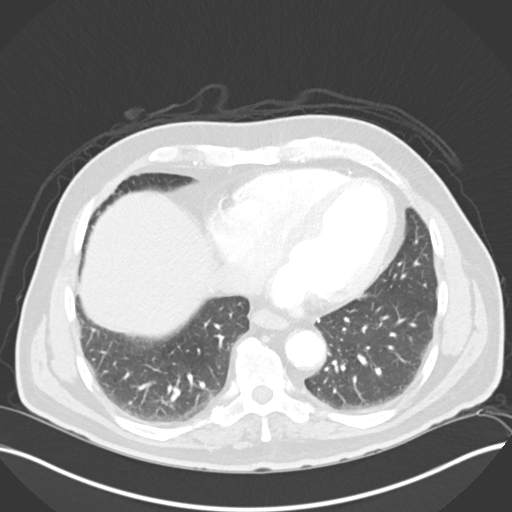
[im 57/114  soft-tissue]
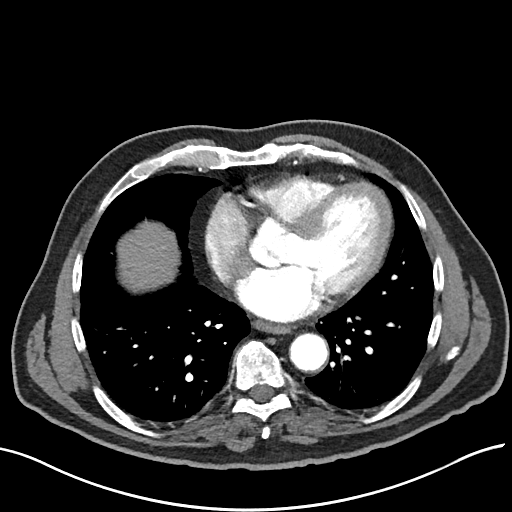
[im 63/114  lung]
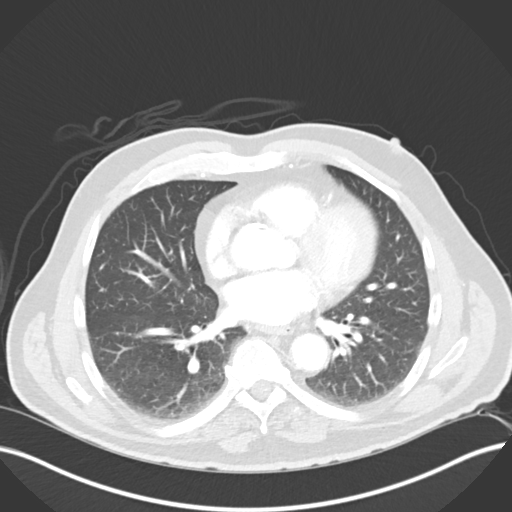
[im 70/114  soft-tissue]
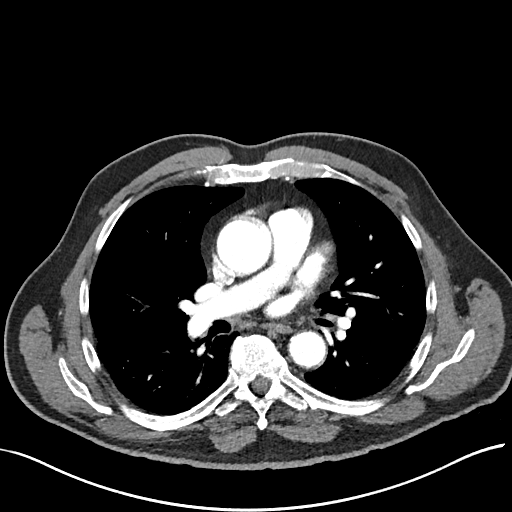
[im 76/114  lung]
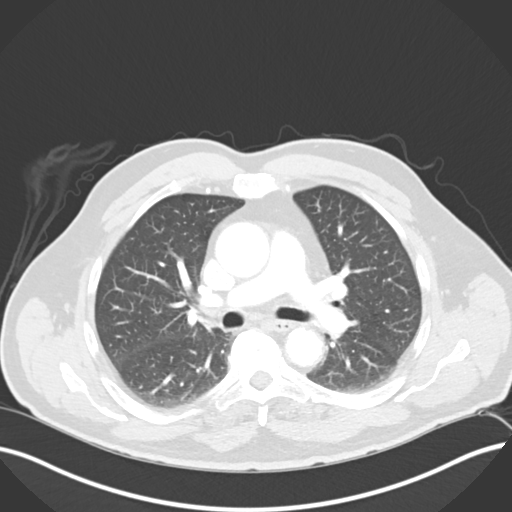
[im 88/114  soft-tissue]
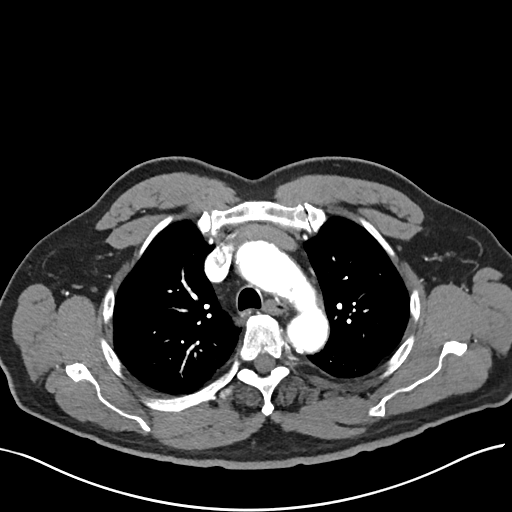
[im 95/114  lung]
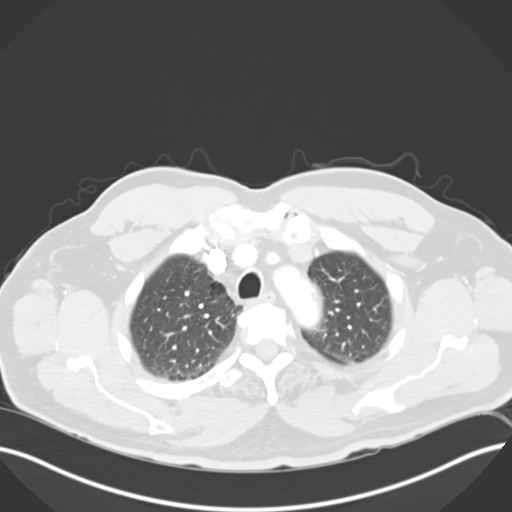
[im 101/114  soft-tissue]
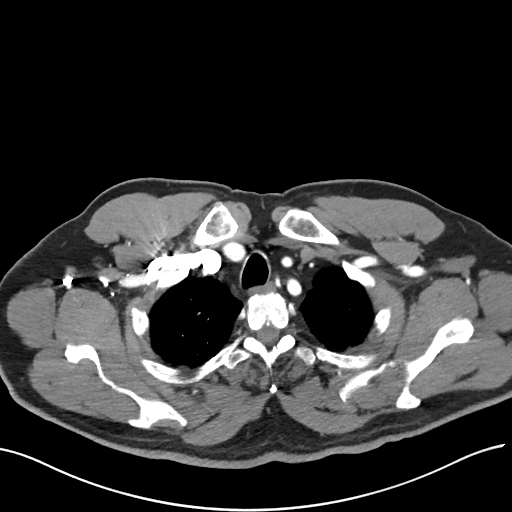
[im 107/114  lung]
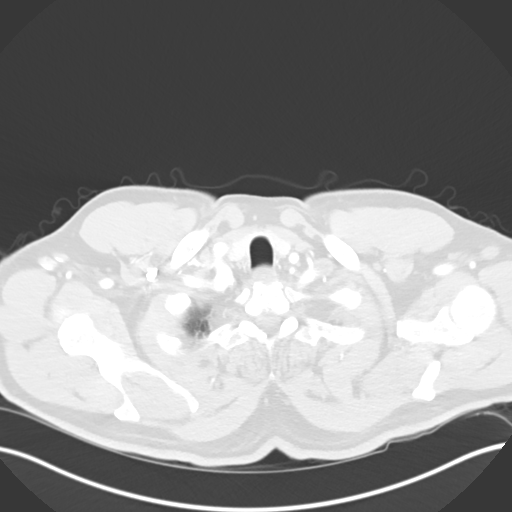

[Series 8: coronals · coronal · 0.70mm/px · 3 of 134 slices shown]
[im 34/134  soft-tissue]
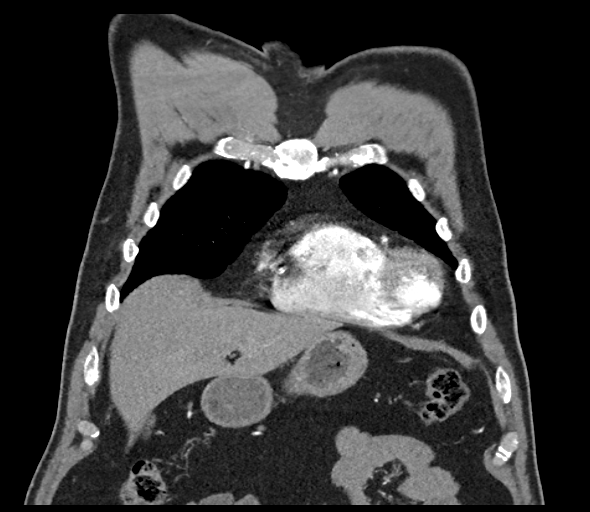
[im 67/134  soft-tissue]
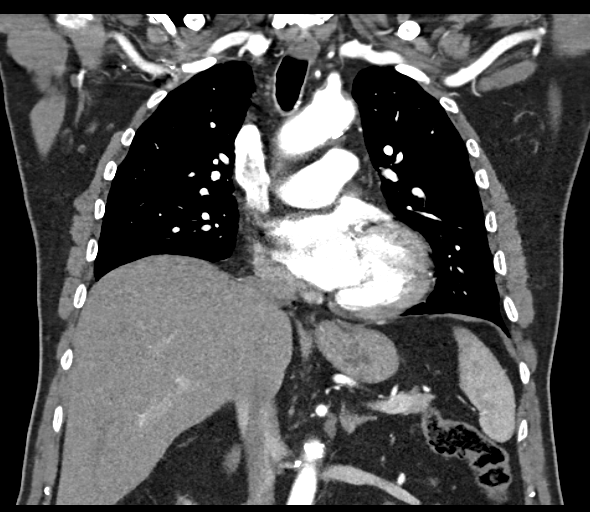
[im 100/134  soft-tissue]
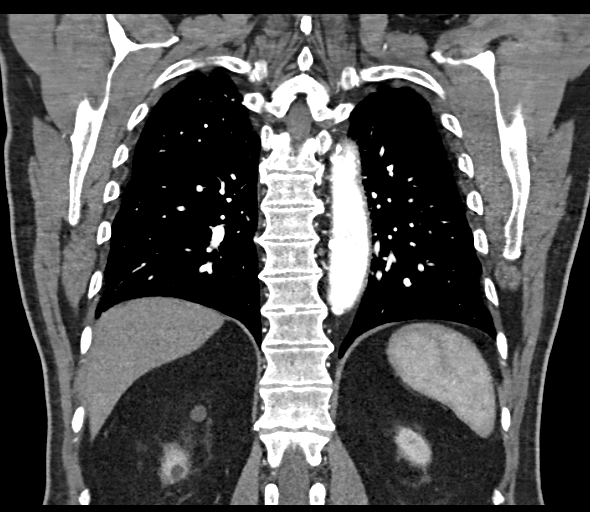

[18 of 46 positions shown; findings below may reference images not displayed]

FINDINGS: The lungs are well aerated bilaterally and demonstrate mild
emphysematous changes. No focal infiltrate, effusion or sizable
parenchymal nodule is noted.

The thoracic inlet is within normal limits. The left vertebral
artery arises directly from the thoracic aorta. Aortic
calcifications are noted. Additionally some mild atherosclerotic
ulcerations are seen particularly in the region of the aortic arch.
At the level of the main pulmonary artery best seen on image number
40 of series 5, the ascending aorta measures 4.3 x 4.2 cm in
greatest dimension. It measures approximately 3.6 cm at the
[place] junction. No focal dissection is noted. Calcific
changes are noted throughout the aorta. The descending thoracic
aorta tapers in a normal fashion.

The visualized visceral vessels in the upper abdomen are within
normal limits with the exception of some calcification at their
origins. The pulmonary artery shows a normal branching pattern. No
pulmonary emboli are seen. A small sliding-type hiatal hernia is
noted. Moderate coronary calcifications are seen. No sizable hilar
or mediastinal adenopathy is noted.

The visualized upper abdomen reveals evidence of a left renal cysts
medially. The bony structures show degenerative change of the
thoracic spine.

Review of the MIP images confirms the above findings.
IMPRESSION: Dilatation of the ascending aorta to 4.3 cm at the level of the main
pulmonary artery. Atherosclerotic calcifications are noted.

Moderate coronary calcifications.

Left renal cyst.

Mild emphysematous changes in the lungs bilaterally.

## 2016-11-12 ENCOUNTER — Encounter (INDEPENDENT_AMBULATORY_CARE_PROVIDER_SITE_OTHER): Payer: Self-pay

## 2016-11-12 ENCOUNTER — Encounter: Payer: Self-pay | Admitting: Internal Medicine

## 2016-11-12 ENCOUNTER — Ambulatory Visit (INDEPENDENT_AMBULATORY_CARE_PROVIDER_SITE_OTHER): Payer: BLUE CROSS/BLUE SHIELD | Admitting: Internal Medicine

## 2016-11-12 VITALS — BP 132/78 | HR 96 | Ht 68.0 in | Wt 199.0 lb

## 2016-11-12 DIAGNOSIS — R000 Tachycardia, unspecified: Secondary | ICD-10-CM | POA: Diagnosis not present

## 2016-11-12 DIAGNOSIS — I712 Thoracic aortic aneurysm, without rupture, unspecified: Secondary | ICD-10-CM

## 2016-11-12 NOTE — Progress Notes (Signed)
Patient Care Team: [name], MD as PCP - General (Family Medicine)   HPI  [name] is a 71 y.o. male Seen in followup for a history of a catheterization in 2008 demonstrate severe LAD disease and moderate circumflex disease with an occluded posterolateral branch. He had normal LV function with perfusion abnormality on Myoview scan is wanting to that distribution. He underwent DES stenting  The patient denies chest pain, shortness of breath, nocturnal dyspnea, orthopnea or peripheral edema.  There have been no palpitations, lightheadedness or syncope.    He also has a thoracic aortic aneurysm  DATE TEST   9/16    CTA 4.3 cm   3/17    CTA 4.3 cm       No back pain. We have started him on a beta blocker which was discontinued by his PCP.    2013 he underwent echocardiogram ordered by his PCP. This demonstrated normal left ventricular function, mild left ventricular hypertrophy Past Medical History:  Diagnosis Date  . Anemia   . BPH (benign prostatic hyperplasia)   . CAD (coronary artery disease)   . Collagen vascular disease ([place])   . Colon polyp   . Diabetes mellitus without complication ([place])   . Esophageal reflux   . Fe deficiency anemia 2012  . FHx: colonic polyps 11/04, 01/2009  . Gastritis   . Hearing loss    wears hearing aid  . Heart attack ([place])   . Hemorrhoids, internal   . Hyperlipidemia   . Hypertension   . Hypogonadism male   . Wears glasses     Past Surgical History:  Procedure Laterality Date  . APPENDECTOMY    . CORONARY ANGIOPLASTY WITH STENT PLACEMENT  2007  . HAND SURGERY     left 2010, right 2012  . [place]   left  . left knee     x3  . left shoulder    . PROSTATE SURGERY    . Right CTS     ?  . ROTATOR CUFF REPAIR Right 12/2012  . TOTAL KNEE ARTHROPLASTY Right 03/08/2015   Procedure: RIGHT TOTAL KNEE ARTHROPLASTY;  Surgeon: Sydnee Cabal, MD;  Location: [facility];  Service: Orthopedics;  Laterality: Right;    [name] VASECTOMY      Current Outpatient Prescriptions  Medication Sig Dispense Refill  . amLODipine (NORVASC) 10 MG tablet Take 10 mg by mouth daily.    [name] aspirin EC 81 MG tablet Take 1 tablet (81 mg total) by mouth daily.    [name] atorvastatin (LIPITOR) 80 MG tablet Take 80 mg by mouth daily.    . celecoxib (CELEBREX) 200 MG capsule Take one capsule (200 mg) by mouth once daily as needed for arthritis    . glipiZIDE (GLUCOTROL) 10 MG tablet Take 10 mg by mouth daily before breakfast.    . losartan (COZAAR) 100 MG tablet Take 100 mg by mouth daily.    [name] omeprazole (PRILOSEC) 20 MG capsule Take 20 mg by mouth 2 (two) times daily before a meal.     . PRESCRIPTION MEDICATION Apply 1 application topically every morning. Testosterone cream.    . tadalafil (CIALIS) 20 MG tablet Take 1/2-1 tablet (10-20 mg) daily as needed    . triamterene-hydrochlorothiazide (MAXZIDE-25) 37.5-25 MG tablet Take 1 tablet by mouth daily.     No current facility-administered medications for this visit.     Allergies  Allergen Reactions  . Clindamycin Hcl Itching  . Metoprolol  Succinate     Pt does not recall.     Review of Systems negative except from HPI and PMH  Physical Exam BP 132/78   Pulse 96   Ht 5\' 8"  (1.727 m)   Wt 199 lb (90.3 kg)   SpO2 98%   BMI 30.26 kg/m  Well developed and well nourished in no acute distress HENT normal E scleral and icterus clear Neck Supple JVP flat; carotids brisk and full Clear to ausculation  Regular rate and rhythm, no murmurs gallops or rub Soft with active bowel sounds No clubbing cyanosis  Edema Alert and oriented, grossly normal motor and sensory function Skin Warm and Dry  ECG  NSR  90 intervals 19/11/38 Probable IMI  Assessment and  Plan  CAD  S/p stenting  Ascending aortic aneurysm   Hypertension  Diabetes mellitus  Without symptoms of ischemia  Blood pressure is alright in the context of his diabetes; this is reasonably controlled with  hemoglobin A1c he says of 6.5.  The issue however is managing his blood pressure in the context of present ascending aortic aneurysm. Beta blockers are recommended with up titration guided by heart rate. We started this last time. This was changed by his PCP. I have called Dr. [name] I will talk with him when he returns to the office.    Repeat imaging is recommended annually   We will repeat

## 2016-11-12 NOTE — Patient Instructions (Addendum)
Medication Instructions: - Your physician recommends that you continue on your current medications as directed. Please refer to the Current Medication list given to you today.  Labwork: - Your physician recommends that you have lab work today: [employer]  Procedures/Testing: - Your physician has recommended that you have a CT angiogram of the chest  Follow-Up: - Your physician wants you to follow-up in: 1 year with Dr. Caryl Comes. You will receive a reminder letter in the mail two months in advance. If you don't receive a letter, please call our office to schedule the follow-up appointment.   Any Additional Special Instructions Will Be Listed Below (If Applicable).     If you need a refill on your cardiac medications before your next appointment, please call your pharmacy.

## 2016-11-13 LAB — BASIC METABOLIC PANEL
BUN / CREAT RATIO: 16 (ref 10–24)
BUN: 16 mg/dL (ref 8–27)
CALCIUM: 9.4 mg/dL (ref 8.6–10.2)
CHLORIDE: 100 mmol/L (ref 96–106)
CO2: 22 mmol/L (ref 18–29)
Creatinine, Ser: 0.98 mg/dL (ref 0.76–1.27)
GFR calc non Af Amer: 77 mL/min/{1.73_m2} (ref >59)
GFR, EST AFRICAN AMERICAN: 89 mL/min/{1.73_m2} (ref >59)
Glucose: 151 mg/dL — ABNORMAL HIGH (ref 65–99)
POTASSIUM: 3.6 mmol/L (ref 3.5–5.2)
Sodium: 140 mmol/L (ref 134–144)

## 2016-11-20 ENCOUNTER — Ambulatory Visit (INDEPENDENT_AMBULATORY_CARE_PROVIDER_SITE_OTHER)
Admission: RE | Admit: 2016-11-20 | Discharge: 2016-11-20 | Disposition: A | Discharge status: Home / Self Care | Payer: BLUE CROSS/BLUE SHIELD | Source: Ambulatory Visit | Attending: Internal Medicine | Admitting: Internal Medicine

## 2016-11-20 DIAGNOSIS — I712 Thoracic aortic aneurysm, without rupture, unspecified: Secondary | ICD-10-CM

## 2016-11-20 MED ORDER — IOPAMIDOL (ISOVUE-370) INJECTION 76%
100.0000 mL | Freq: Once | INTRAVENOUS | Status: AC | PRN
  Administered 2016-11-20: 100 mL via INTRAVENOUS

## 2017-03-17 ENCOUNTER — Other Ambulatory Visit (HOSPITAL_COMMUNITY): Payer: Self-pay | Admitting: Family Medicine

## 2017-03-17 DIAGNOSIS — I6529 Occlusion and stenosis of unspecified carotid artery: Secondary | ICD-10-CM

## 2017-03-19 ENCOUNTER — Ambulatory Visit (HOSPITAL_COMMUNITY)
Admission: RE | Admit: 2017-03-19 | Discharge: 2017-03-19 | Disposition: A | Discharge status: Home / Self Care | Payer: Medicare Other | Source: Ambulatory Visit | Attending: Vascular Surgery | Admitting: Vascular Surgery

## 2017-03-19 DIAGNOSIS — I6529 Occlusion and stenosis of unspecified carotid artery: Secondary | ICD-10-CM | POA: Insufficient documentation

## 2017-03-19 LAB — VAS US CAROTID
LCCADSYS: 86 cm/s
LCCAPDIAS: 13 cm/s
LCCAPSYS: 96 cm/s
LEFT ECA DIAS: -16 cm/s
LEFT VERTEBRAL DIAS: 7 cm/s
LICADDIAS: -14 cm/s
LICADSYS: -51 cm/s
Left CCA dist dias: 16 cm/s
RIGHT CCA MID DIAS: 12 cm/s
RIGHT ECA DIAS: -13 cm/s
RIGHT VERTEBRAL DIAS: 15 cm/s
Right CCA prox dias: -8 cm/s
Right CCA prox sys: -85 cm/s
Right cca dist sys: -58 cm/s

## 2017-06-29 DIAGNOSIS — K635 Polyp of colon: Secondary | ICD-10-CM

## 2017-06-29 HISTORY — DX: Polyp of colon: K63.5

## 2018-03-08 ENCOUNTER — Encounter: Payer: Self-pay | Admitting: Internal Medicine

## 2018-03-08 ENCOUNTER — Ambulatory Visit (INDEPENDENT_AMBULATORY_CARE_PROVIDER_SITE_OTHER): Payer: Medicare Other | Admitting: Internal Medicine

## 2018-03-08 VITALS — BP 142/78 | HR 64 | Ht 68.0 in | Wt 190.4 lb

## 2018-03-08 DIAGNOSIS — I712 Thoracic aortic aneurysm, without rupture, unspecified: Secondary | ICD-10-CM

## 2018-03-08 DIAGNOSIS — I6529 Occlusion and stenosis of unspecified carotid artery: Secondary | ICD-10-CM | POA: Diagnosis not present

## 2018-03-08 DIAGNOSIS — R000 Tachycardia, unspecified: Secondary | ICD-10-CM

## 2018-03-08 DIAGNOSIS — R1907 Generalized intra-abdominal and pelvic swelling, mass and lump: Secondary | ICD-10-CM

## 2018-03-08 NOTE — Patient Instructions (Addendum)
Medication Instructions:  Your physician recommends that you continue on your current medications as directed. Please refer to the Current Medication list given to you today.  Labwork: None ordered.  Testing/Procedures: Non-Cardiac CT Angiography (CTA), is a special type of CT scan that uses a computer to produce multi-dimensional views of major blood vessels throughout the body. In CT angiography, a contrast material is injected through an IV to help visualize the blood vessels  Your physician has requested that you have a lexiscan myoview. For further information please visit [email or web address]. Please follow instruction sheet, as given.   Follow-Up: Your physician wants you to follow-up in: One Year with Dr Caryl Comes. You will receive a reminder letter in the mail two months in advance. If you don't receive a letter, please call our office to schedule the follow-up appointment.   Any Other Special Instructions Will Be Listed Below (If Applicable).     If you need a refill on your cardiac medications before your next appointment, please call your pharmacy.

## 2018-03-08 NOTE — Progress Notes (Signed)
Patient Care Team: [name], MD as PCP - General (Family Medicine)   HPI  [name] is a 72 y.o. male Seen in followup for a history of a catheterization in 2008 demonstrate severe LAD disease and moderate circumflex disease with an occluded posterolateral branch. He had normal LV function with perfusion abnormality on Myoview scan is wanting to that distribution. He underwent DES stenting  He also has a thoracic aortic aneurysm  DATE TEST   9/16 CTA 4.3 cm   3/17 CTA 4.3 cm  5/18 CTA 4.3 cm   He is currently on labetolol and losartan    2013 he underwent echocardiogram >> normal left ventricular function, mild left ventricular hypertrophy  Over the last 6 months he has noted increasing dyspnea on exertion and some fatigue without chest pain His anginal equivalent O so many years ago was dizziness.  No edema.  No nocturnal dyspnea orthopnea.  This [place] 70 [place].   Past Medical History:  Diagnosis Date  . Anemia   . BPH (benign prostatic hyperplasia)   . CAD (coronary artery disease)   . Collagen vascular disease ([place])   . Colon polyp   . Diabetes mellitus without complication ([place])   . Esophageal reflux   . Fe deficiency anemia 2012  . FHx: colonic polyps 11/04, 01/2009  . Gastritis   . Hearing loss    wears hearing aid  . Heart attack ([place])   . Hemorrhoids, internal   . Hyperlipidemia   . Hypertension   . Hypogonadism male   . Wears glasses     Past Surgical History:  Procedure Laterality Date  . APPENDECTOMY    . CORONARY ANGIOPLASTY WITH STENT PLACEMENT  2007  . HAND SURGERY     left 2010, right 2012  . [place]   left  . left knee     x3  . left shoulder    . PROSTATE SURGERY    . Right CTS     ?  . ROTATOR CUFF REPAIR Right 12/2012  . TOTAL KNEE ARTHROPLASTY Right 03/08/2015   Procedure: RIGHT TOTAL KNEE ARTHROPLASTY;  Surgeon: Sydnee Cabal, MD;  Location: [facility];  Service: Orthopedics;   Laterality: Right;  [name] VASECTOMY      Current Outpatient Medications  Medication Sig Dispense Refill  . aspirin EC 81 MG tablet Take 1 tablet (81 mg total) by mouth daily.    [name] atorvastatin (LIPITOR) 80 MG tablet Take 80 mg by mouth daily.    [name] JANUVIA 100 MG tablet Take 100 mg by mouth every morning.  3  . labetalol (NORMODYNE) 100 MG tablet Take 100 mg by mouth 2 (two) times daily.  1  . losartan (COZAAR) 100 MG tablet Take 100 mg by mouth daily.    . metFORMIN (GLUCOPHAGE) 500 MG tablet Take 1,000 mg by mouth 2 (two) times daily with a meal.    . omeprazole (PRILOSEC) 20 MG capsule Take 20 mg by mouth 2 (two) times daily before a meal.     . tadalafil (CIALIS) 20 MG tablet Take 1/2-1 tablet (10-20 mg) daily as needed    . triamterene-hydrochlorothiazide (MAXZIDE-25) 37.5-25 MG tablet Take 1 tablet by mouth daily.     No current facility-administered medications for this visit.     Allergies  Allergen Reactions  . Clindamycin Hcl Itching  . Metoprolol Succinate     Pt does not recall.  Review of Systems negative except from HPI and PMH  Physical Exam BP (!) 142/78   Pulse 64   Ht 5\' 8"  (1.727 m)   Wt 190 lb 6.4 oz (86.4 kg)   SpO2 97%   BMI 28.95 kg/m  Well developed and nourished in no acute distress HENT normal Neck supple with JVP-flat Clear Regular rate and rhythm, no murmurs or gallops Abd-soft with active BS No Clubbing cyanosis edema Skin-warm and dry A & Oriented  Grossly normal sensory and motor function   ECG  NSR @ 64 20/11/40 IMI   Assessment and  Plan  CAD  S/p stenting  Ascending aortic aneurysm   Hypertension  Diabetes mellitus  Dyspnea    Will need repeat CT imaging  Concerned about his dyspnea over the last 3-6 months.  Question anginal equivalent.  It is been years since his stenting.  Undertake nuclear imaging; somewhat concerned about the degree of underlying coronary disease described - reviewed with Dr. Lesly Dukes about the issue of  CTA with stenting  We spent more than 50% of our >25 min visit in face to face counseling regarding the above

## 2018-03-09 ENCOUNTER — Telehealth: Payer: Self-pay

## 2018-03-09 DIAGNOSIS — R000 Tachycardia, unspecified: Secondary | ICD-10-CM

## 2018-03-09 NOTE — Addendum Note (Signed)
Addended by: Dollene Primrose on: 03/09/2018 11:53 AM   Modules accepted: Orders

## 2018-03-09 NOTE — Addendum Note (Signed)
Addended by: Dollene Primrose on: 03/09/2018 10:29 AM   Modules accepted: Orders

## 2018-03-09 NOTE — Telephone Encounter (Signed)
-----   Message from Osvaldo Shipper, [place] sent at 03/09/2018 11:02 AM EDT ----- Regarding: Labs [facility] [place]   Will you put in an order for BMET for [place].   I still see the order for the CT Angio Abdomen did you want that or do you want me to cancel that order?  Thanks stacey

## 2018-03-14 ENCOUNTER — Ambulatory Visit (HOSPITAL_COMMUNITY): Payer: Medicare Other | Attending: Cardiology

## 2018-03-14 ENCOUNTER — Other Ambulatory Visit: Payer: Medicare Other | Admitting: *Deleted

## 2018-03-14 DIAGNOSIS — R000 Tachycardia, unspecified: Secondary | ICD-10-CM

## 2018-03-14 DIAGNOSIS — R1907 Generalized intra-abdominal and pelvic swelling, mass and lump: Secondary | ICD-10-CM

## 2018-03-14 DIAGNOSIS — I712 Thoracic aortic aneurysm, without rupture, unspecified: Secondary | ICD-10-CM

## 2018-03-14 DIAGNOSIS — R5383 Other fatigue: Secondary | ICD-10-CM | POA: Diagnosis not present

## 2018-03-14 DIAGNOSIS — R0602 Shortness of breath: Secondary | ICD-10-CM | POA: Diagnosis present

## 2018-03-14 DIAGNOSIS — I6529 Occlusion and stenosis of unspecified carotid artery: Secondary | ICD-10-CM

## 2018-03-14 LAB — BASIC METABOLIC PANEL
BUN / CREAT RATIO: 21 (ref 10–24)
BUN: 23 mg/dL (ref 8–27)
CHLORIDE: 101 mmol/L (ref 96–106)
CO2: 24 mmol/L (ref 20–29)
Calcium: 9.3 mg/dL (ref 8.6–10.2)
Creatinine, Ser: 1.11 mg/dL (ref 0.76–1.27)
GFR calc non Af Amer: 65 mL/min/{1.73_m2} (ref >59)
GFR, EST AFRICAN AMERICAN: 75 mL/min/{1.73_m2} (ref >59)
Glucose: 145 mg/dL — ABNORMAL HIGH (ref 65–99)
POTASSIUM: 4.3 mmol/L (ref 3.5–5.2)
Sodium: 138 mmol/L (ref 134–144)

## 2018-03-14 LAB — MYOCARDIAL PERFUSION IMAGING
CHL CUP NUCLEAR SRS: 0
CHL CUP RESTING HR STRESS: 53 {beats}/min
CSEPPHR: 76 {beats}/min
LVDIAVOL: 137 mL (ref 62–150)
LVSYSVOL: 66 mL
NUC STRESS TID: 0.95
SDS: 1
SSS: 1

## 2018-03-14 MED ORDER — TECHNETIUM TC 99M TETROFOSMIN IV KIT
10.4000 | PACK | Freq: Once | INTRAVENOUS | Status: AC | PRN
  Administered 2018-03-14: 10.4 via INTRAVENOUS
  Filled 2018-03-14: qty 11

## 2018-03-14 MED ORDER — TECHNETIUM TC 99M TETROFOSMIN IV KIT
31.3000 | PACK | Freq: Once | INTRAVENOUS | Status: AC | PRN
  Administered 2018-03-14: 31.3 via INTRAVENOUS
  Filled 2018-03-14: qty 32

## 2018-03-14 MED ORDER — REGADENOSON 0.4 MG/5ML IV SOLN
0.4000 mg | Freq: Once | INTRAVENOUS | Status: AC
  Administered 2018-03-14: 0.4 mg via INTRAVENOUS

## 2018-03-21 ENCOUNTER — Ambulatory Visit (INDEPENDENT_AMBULATORY_CARE_PROVIDER_SITE_OTHER)
Admission: RE | Admit: 2018-03-21 | Discharge: 2018-03-21 | Disposition: A | Discharge status: Home / Self Care | Payer: Medicare Other | Source: Ambulatory Visit | Attending: Internal Medicine | Admitting: Internal Medicine

## 2018-03-21 DIAGNOSIS — I712 Thoracic aortic aneurysm, without rupture, unspecified: Secondary | ICD-10-CM

## 2018-03-21 MED ORDER — IOPAMIDOL (ISOVUE-370) INJECTION 76%
100.0000 mL | Freq: Once | INTRAVENOUS | Status: AC | PRN
  Administered 2018-03-21: 100 mL via INTRAVENOUS

## 2018-06-11 DIAGNOSIS — N529 Male erectile dysfunction, unspecified: Secondary | ICD-10-CM | POA: Insufficient documentation

## 2018-06-11 DIAGNOSIS — E291 Testicular hypofunction: Secondary | ICD-10-CM | POA: Insufficient documentation

## 2018-06-13 DIAGNOSIS — R6882 Decreased libido: Secondary | ICD-10-CM | POA: Insufficient documentation

## 2018-07-22 ENCOUNTER — Telehealth: Payer: Self-pay | Admitting: Hematology

## 2018-07-22 NOTE — Telephone Encounter (Signed)
Spoke with patient regarding appointments/ letter/calendar mailed

## 2018-07-25 ENCOUNTER — Other Ambulatory Visit: Payer: Self-pay | Admitting: Hematology

## 2018-07-25 DIAGNOSIS — D472 Monoclonal gammopathy: Secondary | ICD-10-CM

## 2018-07-25 DIAGNOSIS — C9000 Multiple myeloma not having achieved remission: Secondary | ICD-10-CM | POA: Insufficient documentation

## 2018-07-25 NOTE — Progress Notes (Signed)
[place] CONSULT NOTE  Patient Care Team: Bob Late, MD as PCP - General (Family Medicine)  HEME/ONC OVERVIEW: 1. Monoclonal gammopathy  -06/2018: SPEP showed M-spike 2.7 g/dL in the gamma globulin region, no IFE; UPEP (random urine sample) showed an abnormal M-spike of 8 mg/dL, IgG kappa on UFE; Cr 0.9, Ca 9.4, Hgb 11.5  PERTINENT NON-HEM/ONC PROBLEMS: 1. Poorly controlled Type II DM  2. HTN  3. Thoracic aneurysm, followed by cardiology  ASSESSMENT & PLAN:   Monoclonal gammopathy  -I reviewed the patient's records in detail, including PCP clinic notes and lab studies -I also independently reviewed CTA chest in 02/2018, and agree with the findings as documented -In summary, patient was found with M-spike of 2.7 g/dL in the gamma globulin region on SPEP in 06/2018; it is unclear why SPEP was ordered, as patient's labs showed normal Hgb, Cr and Ca; UPEP on random urine sample showed M-spike of 8 mg/dL with IgG kappa on UFE; CTA chest in 02/2018 did not show any abnormal bone lesions or thoracic adenopathy -I have ordered complete myeloma panel, including SPEP/IFE, UPEP/UFE (on 24 hour urine sample), B2M, and LDH -I have also ordered PET scan to assess for any bony lesions -Based on the studies above, we will determine if bone marrow biopsy is indicated   Normocytic anemia -Hgb 11.7, baseline -Patient denies any symptoms of bleeding; colonoscopy reportedly normal in 05/2018 except benign polyps -I reviewed the peripheral blood smear Today, which showed normal RBC size and morphology without schistocytosis -I have ordered iron profile today -We will monitor it for now   Leukopenia -WBC 3.0, new  -Patient denies any symptoms of infection or constitutional symptoms -I reviewed the peripheral blood smear, which showed normal WBC morphology without definite dysplastic changes; there were no circulating blasts -I have ordered viral hepatitis panel and nutritional  studies (zinc, copper) at the next visit  -We will repeat CBC in 2 weeks to assess for any interval change  Orders Placed This Encounter  Procedures  . NM PET Image Initial (PI) Whole Body    Standing Status:   Future    Standing Expiration Date:   07/29/2019    Order Specific Question:   If indicated for the ordered procedure, I authorize the administration of a radiopharmaceutical per Radiology protocol    Answer:   Yes    Order Specific Question:   Preferred imaging location?    Answer:   [facility]    Order Specific Question:   Radiology Contrast Protocol - do NOT remove file path    Answer:   [email or web address]  . Ferritin    Standing Status:   Future    Number of Occurrences:   1    Standing Expiration Date:   09/02/2019  . Iron and TIBC    Standing Status:   Future    Number of Occurrences:   1    Standing Expiration Date:   09/02/2019  . CBC with Differential (Cancer Center Only)    Standing Status:   Future    Standing Expiration Date:   09/02/2019  . CMP ([place] only)    Standing Status:   Future    Standing Expiration Date:   09/02/2019  . Zinc    Standing Status:   Future    Standing Expiration Date:   09/02/2019  . Copper, serum    Standing Status:   Future    Standing Expiration Date:   09/02/2019  . Hepatitis  c antibody (reflex)    Standing Status:   Future    Standing Expiration Date:   09/02/2019  . Hepatitis B core antibody, total    Standing Status:   Future    Standing Expiration Date:   09/02/2019  . Hepatitis B surface antibody,qualitative    Standing Status:   Future    Standing Expiration Date:   09/02/2019  . Hepatitis B surface antigen    Standing Status:   Future    Standing Expiration Date:   09/02/2019   All questions were answered. The patient knows to call the clinic with any problems, questions or concerns.  Bob Men, MD 07/29/2018 3:11 PM   CHIEF COMPLAINTS/PURPOSE OF CONSULTATION:  "My doctor sent me  here"  HISTORY OF PRESENTING ILLNESS:  [name] 73 y.o. male is here because of an incidental M-spike on SPEP.  [name] reports that he is very active, including regular exercises.  He denies any constitutional symptoms, chest pain, dyspnea, abdominal pain, nausea, vomiting, diarrhea, abnormal bleeding/bruising.  He presented to his PCP for routine evaluation, and SPEP showed an elevated M-spike. He was referred to oncology for further evaluation.   MEDICAL HISTORY:  Past Medical History:  Diagnosis Date  . Anemia   . BPH (benign prostatic hyperplasia)   . CAD (coronary artery disease)   . Collagen vascular disease ([place])   . Colon polyp   . Diabetes mellitus without complication ([place])   . Esophageal reflux   . Fe deficiency anemia 2012  . FHx: colonic polyps 11/04, 01/2009  . Gastritis   . Hearing loss    wears hearing aid  . Heart attack ([place])   . Hemorrhoids, internal   . Hyperlipidemia   . Hypertension   . Hypogonadism male   . Wears glasses     SURGICAL HISTORY: Past Surgical History:  Procedure Laterality Date  . APPENDECTOMY    . CORONARY ANGIOPLASTY WITH STENT PLACEMENT  2007  . HAND SURGERY     left 2010, right 2012  . [place]   left  . left knee     x3  . left shoulder    . PROSTATE SURGERY    . Right CTS     ?  . ROTATOR CUFF REPAIR Right 12/2012  . TOTAL KNEE ARTHROPLASTY Right 03/08/2015   Procedure: RIGHT TOTAL KNEE ARTHROPLASTY;  Surgeon: Sydnee Cabal, MD;  Location: [facility];  Service: Orthopedics;  Laterality: Right;  [name] VASECTOMY      SOCIAL HISTORY: Social History   Socioeconomic History  . Marital status: Married    Spouse name: Not on file  . Number of children: Not on file  . Years of education: Not on file  . Highest education level: Not on file  Occupational History  . Occupation: Designer, television/film set: [employer]  . Financial resource strain: Not on file  . Food insecurity:     Worry: Not on file    Inability: Not on file  . Transportation needs:    Medical: Not on file    Non-medical: Not on file  Tobacco Use  . Smoking status: Former Smoker    Packs/day: 3.00    Years: 15.00    Pack years: 45.00    Types: Cigarettes    Last attempt to quit: 02/19/1984    Years since quitting: 34.4  . Smokeless tobacco: Never Used  Substance and Sexual Activity  . Alcohol use: Yes  Alcohol/week: 1.0 standard drinks    Types: 1 Glasses of wine per week    Comment: occasional glass of wine   . Drug use: No  . Sexual activity: Yes  Lifestyle  . Physical activity:    Days per week: Not on file    Minutes per session: Not on file  . Stress: Not on file  Relationships  . Social connections:    Talks on phone: Not on file    Gets together: Not on file    Attends religious service: Not on file    Active member of club or organization: Not on file    Attends meetings of clubs or organizations: Not on file    Relationship status: Not on file  . Intimate partner violence:    Fear of current or ex partner: Not on file    Emotionally abused: Not on file    Physically abused: Not on file    Forced sexual activity: Not on file  Other Topics Concern  . Not on file  Social History Narrative   ** Merged History Encounter **        FAMILY HISTORY: Family History  Problem Relation Age of Onset  . Heart disease Mother   . Cancer Father   . Other Sister        brain tumor  . Stroke Brother   . Cancer Sister   . Colon polyps Unknown   . Liver disease Unknown     ALLERGIES:  is allergic to clindamycin hcl and metoprolol succinate.  MEDICATIONS:  Current Outpatient Medications  Medication Sig Dispense Refill  . aspirin EC 81 MG tablet Take 1 tablet (81 mg total) by mouth daily.    [name] atorvastatin (LIPITOR) 80 MG tablet Take 80 mg by mouth daily.    [name] JANUVIA 100 MG tablet Take 100 mg by mouth every morning.  3  . labetalol (NORMODYNE) 100 MG tablet Take 100 mg by  mouth 2 (two) times daily.  1  . losartan (COZAAR) 100 MG tablet Take 100 mg by mouth daily.    . metFORMIN (GLUCOPHAGE) 500 MG tablet Take 1,000 mg by mouth 2 (two) times daily with a meal.    . omeprazole (PRILOSEC) 20 MG capsule Take 20 mg by mouth 2 (two) times daily before a meal.     . tadalafil (CIALIS) 20 MG tablet Take 1/2-1 tablet (10-20 mg) daily as needed    . Testosterone 25 MG/2.5GM (1%) GEL Apply topically.    . triamterene-hydrochlorothiazide (MAXZIDE-25) 37.5-25 MG tablet Take 1 tablet by mouth daily.     No current facility-administered medications for this visit.     REVIEW OF SYSTEMS:   Constitutional: ( - ) fevers, ( - )  chills , ( - ) night sweats Eyes: ( - ) blurriness of vision, ( - ) double vision, ( - ) watery eyes Ears, nose, mouth, throat, and face: ( - ) mucositis, ( - ) sore throat Respiratory: ( - ) cough, ( - ) dyspnea, ( - ) wheezes Cardiovascular: ( - ) palpitation, ( - ) chest discomfort, ( - ) lower extremity swelling Gastrointestinal:  ( - ) nausea, ( - ) heartburn, ( - ) change in bowel habits Skin: ( - ) abnormal skin rashes Lymphatics: ( - ) new lymphadenopathy, ( - ) easy bruising Neurological: ( - ) numbness, ( - ) tingling, ( - ) new weaknesses Behavioral/Psych: ( - ) mood change, ( - ) new changes  All other systems were reviewed with the patient and are negative.  PHYSICAL EXAMINATION: ECOG PERFORMANCE STATUS: 0 - Asymptomatic  Vitals:   07/29/18 1355  BP: (!) 153/79  Pulse: 72  Resp: 18  Temp: 97.9 F (36.6 C)  SpO2: 100%   Filed Weights   07/29/18 1355  Weight: 197 lb 6.4 oz (89.5 kg)    GENERAL: alert, no distress and comfortable SKIN: skin color, texture, turgor are normal, no rashes or significant lesions EYES: conjunctiva are pink and non-injected, sclera clear OROPHARYNX: no exudate, no erythema; lips, buccal mucosa, and tongue normal  NECK: supple, non-tender LYMPH:  no palpable lymphadenopathy in the cervical or  axillary LUNGS: clear to auscultation and percussion with normal breathing effort HEART: regular rate & rhythm, no murmurs, no lower extremity edema ABDOMEN: soft, non-tender, non-distended, normal bowel sounds Musculoskeletal: no cyanosis of digits and no clubbing  PSYCH: alert & oriented x 3, fluent speech NEURO: no focal motor/sensory deficits  LABORATORY DATA:  I have reviewed the data as listed Lab Results  Component Value Date   WBC 3.0 (L) 07/29/2018   HGB 11.7 (L) 07/29/2018   HCT 36.1 (L) 07/29/2018   MCV 86.4 07/29/2018   PLT 156 07/29/2018   Lab Results  Component Value Date   NA 136 07/29/2018   K 3.7 07/29/2018   CL 101 07/29/2018   CO2 29 07/29/2018    RADIOGRAPHIC STUDIES: I have personally reviewed the radiological images as listed and agreed with the findings in the report. No results found.  PATHOLOGY: I personally reviewed the patient's peripheral blood smear today.  The red blood cells were of normal morphology.  There was no schistocytosis.  The white blood cells were of normal morphology. There were no peripheral circulating blasts. The platelets were of normal size and I verified that there were no platelet clumping.

## 2018-07-29 ENCOUNTER — Telehealth: Payer: Self-pay | Admitting: Hematology

## 2018-07-29 ENCOUNTER — Encounter: Payer: Self-pay | Admitting: *Deleted

## 2018-07-29 ENCOUNTER — Inpatient Hospital Stay: Payer: Medicare Other | Attending: Hematology | Admitting: Hematology

## 2018-07-29 ENCOUNTER — Encounter: Payer: Self-pay | Admitting: Hematology

## 2018-07-29 ENCOUNTER — Other Ambulatory Visit: Payer: Self-pay

## 2018-07-29 ENCOUNTER — Inpatient Hospital Stay: Payer: Medicare Other

## 2018-07-29 VITALS — BP 153/79 | HR 72 | Temp 97.9°F | Resp 18 | Ht 68.0 in | Wt 197.4 lb

## 2018-07-29 DIAGNOSIS — D72819 Decreased white blood cell count, unspecified: Secondary | ICD-10-CM

## 2018-07-29 DIAGNOSIS — Z79899 Other long term (current) drug therapy: Secondary | ICD-10-CM | POA: Insufficient documentation

## 2018-07-29 DIAGNOSIS — D649 Anemia, unspecified: Secondary | ICD-10-CM

## 2018-07-29 DIAGNOSIS — D472 Monoclonal gammopathy: Secondary | ICD-10-CM

## 2018-07-29 LAB — CMP (CANCER CENTER ONLY)
ALBUMIN: 4.3 g/dL (ref 3.5–5.0)
ALK PHOS: 41 U/L (ref 38–126)
ALT: 17 U/L (ref 0–44)
AST: 16 U/L (ref 15–41)
Anion gap: 6 (ref 5–15)
BILIRUBIN TOTAL: 0.4 mg/dL (ref 0.3–1.2)
BUN: 22 mg/dL (ref 8–23)
CALCIUM: 9.7 mg/dL (ref 8.9–10.3)
CO2: 29 mmol/L (ref 22–32)
CREATININE: 0.96 mg/dL (ref 0.61–1.24)
Chloride: 101 mmol/L (ref 98–111)
GFR, Est AFR Am: >60 mL/min (ref >60)
GFR, Estimated: >60 mL/min (ref >60)
GLUCOSE: 144 mg/dL — AB (ref 70–99)
Potassium: 3.7 mmol/L (ref 3.5–5.1)
Sodium: 136 mmol/L (ref 135–145)
TOTAL PROTEIN: 8.4 g/dL — AB (ref 6.5–8.1)

## 2018-07-29 LAB — SAVE SMEAR (SSMR)

## 2018-07-29 LAB — CBC WITH DIFFERENTIAL (CANCER CENTER ONLY)
Abs Immature Granulocytes: 0.01 10*3/uL (ref 0.00–0.07)
Basophils Absolute: 0 10*3/uL (ref 0.0–0.1)
Basophils Relative: 0 %
EOS ABS: 0.1 10*3/uL (ref 0.0–0.5)
Eosinophils Relative: 3 %
HEMATOCRIT: 36.1 % — AB (ref 39.0–52.0)
HEMOGLOBIN: 11.7 g/dL — AB (ref 13.0–17.0)
IMMATURE GRANULOCYTES: 0 %
LYMPHS ABS: 1.2 10*3/uL (ref 0.7–4.0)
Lymphocytes Relative: 40 %
MCH: 28 pg (ref 26.0–34.0)
MCHC: 32.4 g/dL (ref 30.0–36.0)
MCV: 86.4 fL (ref 80.0–100.0)
MONOS PCT: 9 %
Monocytes Absolute: 0.3 10*3/uL (ref 0.1–1.0)
NEUTROS PCT: 48 %
Neutro Abs: 1.5 10*3/uL — ABNORMAL LOW (ref 1.7–7.7)
Platelet Count: 156 10*3/uL (ref 150–400)
RBC: 4.18 MIL/uL — ABNORMAL LOW (ref 4.22–5.81)
RDW: 13.8 % (ref 11.5–15.5)
WBC Count: 3 10*3/uL — ABNORMAL LOW (ref 4.0–10.5)
nRBC: 0 % (ref 0.0–0.2)

## 2018-07-29 LAB — LACTATE DEHYDROGENASE: LDH: 141 U/L (ref 98–192)

## 2018-07-29 NOTE — Telephone Encounter (Signed)
Appointments scheduled avs/calendar printed per 1/31 los

## 2018-07-29 NOTE — Progress Notes (Signed)
Initial RN Navigator Patient Visit  Name: [name] Date of Referral : 07/20/2018 Diagnosis: Abnormal SPEP  Met with patient prior to their visit with MD. Hanley Seamen patient "Your Patient Navigator" handout which explains my role, areas in which I am able to help, and all the contact information for myself and the office. Also gave patient MD and Navigator business card. Reviewed with patient the general overview of expected course after initial diagnosis and time frame for all steps to be completed.  Patient completed visit with Dr. Maylon Peppers  Revisited with patient after MD visit. Patient will need  PET Scan - will wait for insurance approval. Will follow up on Monday of next week to schedule scan.   Patient understands all follow up procedures and expectations. They have my number to reach out for any further clarification or additional needs. Will call patient in 5-7 days to see if any further needs have presented, or if patient has any further questions or needs.

## 2018-07-30 LAB — BETA 2 MICROGLOBULIN, SERUM: BETA 2 MICROGLOBULIN: 2.3 mg/L (ref 0.6–2.4)

## 2018-08-01 ENCOUNTER — Encounter: Payer: Self-pay | Admitting: *Deleted

## 2018-08-01 LAB — KAPPA/LAMBDA LIGHT CHAINS
KAPPA FREE LGHT CHN: 130 mg/L — AB (ref 3.3–19.4)
Kappa, lambda light chain ratio: 19.7 — ABNORMAL HIGH (ref 0.26–1.65)
LAMDA FREE LIGHT CHAINS: 6.6 mg/L (ref 5.7–26.3)

## 2018-08-01 LAB — IRON AND TIBC
Iron: 78 ug/dL (ref 42–163)
Saturation Ratios: 18 % — ABNORMAL LOW (ref 20–55)
TIBC: 431 ug/dL — ABNORMAL HIGH (ref 202–409)
UIBC: 353 ug/dL (ref 117–376)

## 2018-08-01 LAB — FERRITIN: Ferritin: 7 ng/mL — ABNORMAL LOW (ref 24–336)

## 2018-08-01 NOTE — Progress Notes (Signed)
Spoke with patient and gave him time, date and location of his PET scan. Also reviewed the following: NPO after midnight No carbs after 8pm the night before Arrive by 0730  Teaching confirmed with teach back.  Also gave patient the following message from Dr Carole Binning, MD  P Onc Nurse Hp        Can we call the patient and let him know that he is a little iron deficient and that he should take one tab of OTC iron sulfate every other day?

## 2018-08-02 LAB — MULTIPLE MYELOMA PANEL, SERUM
ALBUMIN SERPL ELPH-MCNC: 3.9 g/dL (ref 2.9–4.4)
Albumin/Glob SerPl: 0.9 (ref 0.7–1.7)
Alpha 1: 0.2 g/dL (ref 0.0–0.4)
Alpha2 Glob SerPl Elph-Mcnc: 0.7 g/dL (ref 0.4–1.0)
B-Globulin SerPl Elph-Mcnc: 0.9 g/dL (ref 0.7–1.3)
Gamma Glob SerPl Elph-Mcnc: 2.6 g/dL — ABNORMAL HIGH (ref 0.4–1.8)
Globulin, Total: 4.4 g/dL — ABNORMAL HIGH (ref 2.2–3.9)
IGM (IMMUNOGLOBULIN M), SRM: 20 mg/dL (ref 15–143)
IgA: 38 mg/dL — ABNORMAL LOW (ref 61–437)
IgG (Immunoglobin G), Serum: 3534 mg/dL — ABNORMAL HIGH (ref 700–1600)
M Protein SerPl Elph-Mcnc: 2.5 g/dL — ABNORMAL HIGH
Total Protein ELP: 8.3 g/dL (ref 6.0–8.5)

## 2018-08-05 LAB — UPEP/UIFE/LIGHT CHAINS/TP, 24-HR UR
% BETA, Urine: 39.3 %
ALPHA 1 URINE: 3 %
Albumin, U: 13.9 %
Alpha 2, Urine: 7.7 %
Free Kappa Lt Chains,Ur: 445.26 mg/L — ABNORMAL HIGH (ref 0.63–113.79)
Free Kappa/Lambda Ratio: 36.41 — ABNORMAL HIGH (ref 1.03–31.76)
Free Lambda Lt Chains,Ur: 12.23 mg/L — ABNORMAL HIGH (ref 0.47–11.77)
GAMMA GLOBULIN URINE: 36 %
M-SPIKE %, Urine: 23.6 % — ABNORMAL HIGH
M-Spike, Mg/24 Hr: 82 mg/24 hr — ABNORMAL HIGH
Total Protein, Urine-Ur/day: 345 mg/24 hr — ABNORMAL HIGH (ref 30–150)
Total Protein, Urine: 14.7 mg/dL
Total Volume: 2350

## 2018-08-10 ENCOUNTER — Ambulatory Visit (HOSPITAL_COMMUNITY)
Admission: RE | Admit: 2018-08-10 | Discharge: 2018-08-10 | Disposition: A | Discharge status: Home / Self Care | Payer: Medicare Other | Source: Ambulatory Visit | Attending: Hematology | Admitting: Hematology

## 2018-08-10 DIAGNOSIS — D472 Monoclonal gammopathy: Secondary | ICD-10-CM | POA: Diagnosis present

## 2018-08-10 LAB — GLUCOSE, CAPILLARY: Glucose-Capillary: 152 mg/dL — ABNORMAL HIGH (ref 70–99)

## 2018-08-10 MED ORDER — FLUDEOXYGLUCOSE F - 18 (FDG) INJECTION
9.7800 | Freq: Once | INTRAVENOUS | Status: AC | PRN
  Administered 2018-08-10: 9.78 via INTRAVENOUS

## 2018-08-11 NOTE — Progress Notes (Signed)
[place] OFFICE PROGRESS NOTE  Patient Care Team: [name], MD as PCP - General (Family Medicine)  HEME/ONC OVERVIEW: 1. IgG kappa MGUS  -06/2018: SPEP showed M-spike 2.7 g/dL, no other myeloma labs  -Late 06/2018: baseline labs  Hgb 11.7, Cr 0.96, Ca 9.7  M-spike 2.5 g/dL, monoclonal IgG kappa, free kappa 130, quant IgG 3534, LDH 141, B2M 2.3,   No FDG-avid lesions on PET   2. Incidental exophytic lesion of R kidney -06/2018: PET showed bilateral renal cysts, including an 54m exophytic hyperdense lesion from the right kidney, likely a complex cyst   PERTINENT NON-HEM/ONC PROBLEMS: 1. Poorly controlled Type II DM  2. HTN  3. Thoracic aneurysm, followed by cardiology  ASSESSMENT & PLAN:   IgG kappa MGUS -I reviewed the patient's lab studies in detail; in addition, I also independently reviewed the radiologic measures of recent PET, and agree with the findings as documented -In summary, PET showed no evidence of FDG avid lesion to suggest myeloma involvement; there were incidental bilateral renal cysts, including a 831mexophytic hyperdense lesion from right kidney, likely a complex cyst; labs are notable for mild persistent leukopenia and anemia  -I reviewed the lab studies and imaging results in detail with the patient -In the setting of moderately elevated M-spike as well as anemia and leukopenia, I believe bone marrow biopsy will be helpful to assess the percentage of abnormal plasma cells in order to determine if this represents MGUS vs. MM  -I discussed with the patient some of the benefits and risks of bone marrow biopsy, including bleeding, infection, and injury to nearby structures; after extensive discussion, patient agreed to proceed with the procedure -Based on the bone marrow biopsy results, we will determine the next step  Normocytic anemia -Iron profile consistent with mild iron deficiency anemia -Pt reports that his last colonoscopy was normal  in 05/2018 except benign polyps -Hgb 12.3 today, stable  -I have prescribed oral iron supplement q2days   Leukopenia -WBC 3.4k with ANC 1700 today, stable  -Patient denies any symptoms of infection -Zinc, copper levels as well as viral hepatitis panel pending -We will monitor it for now   No orders of the defined types were placed in this encounter.  All questions were answered. The patient knows to call the clinic with any problems, questions or concerns. No barriers to learning was detected.  Return in 3 weeks to follow up bone marrow biopsy results.   [name] 08/12/2018 10:23 AM  CHIEF COMPLAINT: "I am doing fine"  INTERVAL HISTORY: Mr. CoErnestine Mcmurrayeturns to clinic to follow-up recent PET scan and myeloma lab studies.  Patient reports that he is doing well, and continues to golf very regularly without any restriction.  He denies any constitutional symptoms, chest pain, dyspnea, abdominal pain, nausea, vomiting, diarrhea, or abnormal bleeding/bruising.  REVIEW OF SYSTEMS:   Constitutional: ( - ) fevers, ( - )  chills , ( - ) night sweats Eyes: ( - ) blurriness of vision, ( - ) double vision, ( - ) watery eyes Ears, nose, mouth, throat, and face: ( - ) mucositis, ( - ) sore throat Respiratory: ( - ) cough, ( - ) dyspnea, ( - ) wheezes Cardiovascular: ( - ) palpitation, ( - ) chest discomfort, ( - ) lower extremity swelling Gastrointestinal:  ( - ) nausea, ( - ) heartburn, ( - ) change in bowel habits Skin: ( - ) abnormal skin rashes Lymphatics: ( - ) new lymphadenopathy, ( - )  easy bruising Neurological: ( - ) numbness, ( - ) tingling, ( - ) new weaknesses Behavioral/Psych: ( - ) mood change, ( - ) new changes  All other systems were reviewed with the patient and are negative.  I have reviewed the past medical history, past surgical history, social history and family history with the patient and they are unchanged from previous note.  ALLERGIES:  is allergic to clindamycin hcl  and metoprolol succinate.  MEDICATIONS:  Current Outpatient Medications  Medication Sig Dispense Refill  . valsartan (DIOVAN) 320 MG tablet Take 320 mg by mouth daily.    [name] aspirin EC 81 MG tablet Take 1 tablet (81 mg total) by mouth daily.    [name] atorvastatin (LIPITOR) 80 MG tablet Take 80 mg by mouth daily.    . ferrous sulfate 325 (65 FE) MG EC tablet Take 1 tablet (325 mg total) by mouth every other day for 30 days. 15 tablet 5  . JANUVIA 100 MG tablet Take 100 mg by mouth every morning.  3  . metFORMIN (GLUCOPHAGE) 500 MG tablet Take 1,000 mg by mouth 2 (two) times daily with a meal.    . omeprazole (PRILOSEC) 20 MG capsule Take 20 mg by mouth 2 (two) times daily before a meal.     . tadalafil (CIALIS) 20 MG tablet Take 1/2-1 tablet (10-20 mg) daily as needed    . Testosterone 25 MG/2.5GM (1%) GEL Apply topically.    . triamterene-hydrochlorothiazide (MAXZIDE-25) 37.5-25 MG tablet Take 1 tablet by mouth daily.     No current facility-administered medications for this visit.     PHYSICAL EXAMINATION: ECOG PERFORMANCE STATUS: 0 - Asymptomatic  Today's Vitals   08/12/18 0919  BP: (!) 141/77  Pulse: 78  Resp: 18  Temp: 98.3 F (36.8 C)  TempSrc: Oral  SpO2: 100%  Weight: 194 lb (88 kg)  PainSc: 0-No pain   Body mass index is 29.5 kg/m.  Filed Weights   08/12/18 0919  Weight: 194 lb (88 kg)    GENERAL: alert, no distress and comfortable SKIN: skin color, texture, turgor are normal, no rashes or significant lesions EYES: conjunctiva are pink and non-injected, sclera clear OROPHARYNX: no exudate, no erythema; lips, buccal mucosa, and tongue normal  NECK: supple, non-tender LYMPH:  no palpable lymphadenopathy in the cervical LUNGS: clear to auscultation and percussion with normal breathing effort HEART: regular rate & rhythm and no murmurs and no lower extremity edema ABDOMEN: soft, non-tender, non-distended, normal bowel sounds Musculoskeletal: no cyanosis of digits and  no clubbing  PSYCH: alert & oriented x 3, fluent speech NEURO: no focal motor/sensory deficits  LABORATORY DATA:  I have reviewed the data as listed    Component Value Date/Time   NA 137 08/12/2018 0901   NA 138 03/14/2018 0944   K 3.7 08/12/2018 0901   CL 99 08/12/2018 0901   CO2 30 08/12/2018 0901   GLUCOSE 177 (H) 08/12/2018 0901   BUN 18 08/12/2018 0901   BUN 23 03/14/2018 0944   CREATININE 1.19 08/12/2018 0901   CREATININE 1.12 09/03/2015 1201   CALCIUM 10.1 08/12/2018 0901   PROT 8.5 (H) 08/12/2018 0901   ALBUMIN 4.3 08/12/2018 0901   AST 18 08/12/2018 0901   ALT 22 08/12/2018 0901   ALKPHOS 44 08/12/2018 0901   BILITOT 0.4 08/12/2018 0901   GFRNONAA 60 (L) 08/12/2018 0901   GFRAA >60 08/12/2018 0901    No results found for: SPEP, UPEP  Lab Results  Component Value Date  WBC 3.4 (L) 08/12/2018   NEUTROABS 1.7 08/12/2018   HGB 12.3 (L) 08/12/2018   HCT 37.7 (L) 08/12/2018   MCV 86.7 08/12/2018   PLT 182 08/12/2018      Chemistry      Component Value Date/Time   NA 137 08/12/2018 0901   NA 138 03/14/2018 0944   K 3.7 08/12/2018 0901   CL 99 08/12/2018 0901   CO2 30 08/12/2018 0901   BUN 18 08/12/2018 0901   BUN 23 03/14/2018 0944   CREATININE 1.19 08/12/2018 0901   CREATININE 1.12 09/03/2015 1201      Component Value Date/Time   CALCIUM 10.1 08/12/2018 0901   ALKPHOS 44 08/12/2018 0901   AST 18 08/12/2018 0901   ALT 22 08/12/2018 0901   BILITOT 0.4 08/12/2018 0901       RADIOGRAPHIC STUDIES: I have personally reviewed the radiological images as listed below and agreed with the findings in the report. Nm Pet Image Initial (pi) Whole Body  Result Date: 08/10/2018 CLINICAL DATA:  Initial treatment strategy for monoclonal gammopathy. EXAM: NUCLEAR MEDICINE PET WHOLE BODY TECHNIQUE: 7.8 mCi F-18 FDG was injected intravenously. Full-ring PET imaging was performed from the skull base to thigh after the radiotracer. CT data was obtained and used for  attenuation correction and anatomic localization. Fasting blood glucose: 152 mg/dl COMPARISON:  None. FINDINGS: Mediastinal blood pool activity: SUV max 3.0 HEAD/NECK: No significant abnormal hypermetabolic activity in this region. Incidental CT findings: Bilateral common carotid artery atherosclerotic calcification. CHEST: No significant abnormal hypermetabolic activity in this region. Incidental CT findings: Coronary, aortic arch, and branch vessel atherosclerotic vascular disease. Mild cardiomegaly. Paraseptal emphysema appreciable at the lung apices. ABDOMEN/PELVIS: No significant abnormal hypermetabolic activity in this region. Incidental CT findings: Bilateral renal cysts. A hyperdense 8 mm exophytic lesion from the right kidney upper pole is not appreciably hypermetabolic and likely simply a complex Bosniak category 2 cyst, although enhancement characteristics are not assessed today. Aortoiliac atherosclerotic vascular disease. Small umbilical hernia contains adipose tissue. Prostatomegaly. Scattered descending colon diverticula. SKELETON: No significant abnormal hypermetabolic activity in this region. Incidental CT findings: Right total knee prosthesis. EXTREMITIES: No significant abnormal hypermetabolic activity in this region. Incidental CT findings: none IMPRESSION: 1. No significant abnormal hypermetabolic lesions are identified. 2. Aortic Atherosclerosis (ICD10-I70.0) and Emphysema (ICD10-J43.9). Coronary atherosclerosis. 3. Bilateral renal cysts. An 8 mm exophytic hyperdense lesion from the right kidney upper pole this not appreciably hypermetabolic and is most likely to be a complex cyst. 4. Small umbilical hernia contains adipose tissue. 5. Prostatomegaly. Electronically Signed   By: Van Clines M.D.   On: 08/10/2018 09:47

## 2018-08-12 ENCOUNTER — Other Ambulatory Visit: Payer: Self-pay

## 2018-08-12 ENCOUNTER — Telehealth: Payer: Self-pay

## 2018-08-12 ENCOUNTER — Encounter: Payer: Self-pay | Admitting: *Deleted

## 2018-08-12 ENCOUNTER — Inpatient Hospital Stay (HOSPITAL_BASED_OUTPATIENT_CLINIC_OR_DEPARTMENT_OTHER): Payer: Medicare Other | Admitting: Hematology

## 2018-08-12 ENCOUNTER — Inpatient Hospital Stay: Payer: Medicare Other | Attending: Hematology

## 2018-08-12 ENCOUNTER — Encounter: Payer: Self-pay | Admitting: Hematology

## 2018-08-12 VITALS — BP 141/77 | HR 78 | Temp 98.3°F | Resp 18 | Wt 194.0 lb

## 2018-08-12 DIAGNOSIS — K5730 Diverticulosis of large intestine without perforation or abscess without bleeding: Secondary | ICD-10-CM | POA: Diagnosis not present

## 2018-08-12 DIAGNOSIS — I2510 Atherosclerotic heart disease of native coronary artery without angina pectoris: Secondary | ICD-10-CM | POA: Insufficient documentation

## 2018-08-12 DIAGNOSIS — E1165 Type 2 diabetes mellitus with hyperglycemia: Secondary | ICD-10-CM | POA: Insufficient documentation

## 2018-08-12 DIAGNOSIS — D72819 Decreased white blood cell count, unspecified: Secondary | ICD-10-CM | POA: Insufficient documentation

## 2018-08-12 DIAGNOSIS — Z79899 Other long term (current) drug therapy: Secondary | ICD-10-CM | POA: Insufficient documentation

## 2018-08-12 DIAGNOSIS — N400 Enlarged prostate without lower urinary tract symptoms: Secondary | ICD-10-CM | POA: Diagnosis not present

## 2018-08-12 DIAGNOSIS — I10 Essential (primary) hypertension: Secondary | ICD-10-CM | POA: Insufficient documentation

## 2018-08-12 DIAGNOSIS — D649 Anemia, unspecified: Secondary | ICD-10-CM

## 2018-08-12 DIAGNOSIS — K429 Umbilical hernia without obstruction or gangrene: Secondary | ICD-10-CM | POA: Insufficient documentation

## 2018-08-12 DIAGNOSIS — D472 Monoclonal gammopathy: Secondary | ICD-10-CM

## 2018-08-12 DIAGNOSIS — N281 Cyst of kidney, acquired: Secondary | ICD-10-CM | POA: Insufficient documentation

## 2018-08-12 DIAGNOSIS — Z7982 Long term (current) use of aspirin: Secondary | ICD-10-CM

## 2018-08-12 DIAGNOSIS — J439 Emphysema, unspecified: Secondary | ICD-10-CM | POA: Diagnosis not present

## 2018-08-12 DIAGNOSIS — Z7984 Long term (current) use of oral hypoglycemic drugs: Secondary | ICD-10-CM | POA: Diagnosis not present

## 2018-08-12 LAB — CBC WITH DIFFERENTIAL (CANCER CENTER ONLY)
Abs Immature Granulocytes: 0.01 10*3/uL (ref 0.00–0.07)
BASOS PCT: 0 %
Basophils Absolute: 0 10*3/uL (ref 0.0–0.1)
Eosinophils Absolute: 0.1 10*3/uL (ref 0.0–0.5)
Eosinophils Relative: 2 %
HCT: 37.7 % — ABNORMAL LOW (ref 39.0–52.0)
HEMOGLOBIN: 12.3 g/dL — AB (ref 13.0–17.0)
Immature Granulocytes: 0 %
Lymphocytes Relative: 40 %
Lymphs Abs: 1.3 10*3/uL (ref 0.7–4.0)
MCH: 28.3 pg (ref 26.0–34.0)
MCHC: 32.6 g/dL (ref 30.0–36.0)
MCV: 86.7 fL (ref 80.0–100.0)
Monocytes Absolute: 0.3 10*3/uL (ref 0.1–1.0)
Monocytes Relative: 8 %
Neutro Abs: 1.7 10*3/uL (ref 1.7–7.7)
Neutrophils Relative %: 50 %
Platelet Count: 182 10*3/uL (ref 150–400)
RBC: 4.35 MIL/uL (ref 4.22–5.81)
RDW: 13.7 % (ref 11.5–15.5)
WBC Count: 3.4 10*3/uL — ABNORMAL LOW (ref 4.0–10.5)
nRBC: 0 % (ref 0.0–0.2)

## 2018-08-12 LAB — CMP (CANCER CENTER ONLY)
ALK PHOS: 44 U/L (ref 38–126)
ALT: 22 U/L (ref 0–44)
AST: 18 U/L (ref 15–41)
Albumin: 4.3 g/dL (ref 3.5–5.0)
Anion gap: 8 (ref 5–15)
BUN: 18 mg/dL (ref 8–23)
CO2: 30 mmol/L (ref 22–32)
CREATININE: 1.19 mg/dL (ref 0.61–1.24)
Calcium: 10.1 mg/dL (ref 8.9–10.3)
Chloride: 99 mmol/L (ref 98–111)
GFR, Est AFR Am: >60 mL/min (ref >60)
GFR, Estimated: 60 mL/min — ABNORMAL LOW (ref >60)
Glucose, Bld: 177 mg/dL — ABNORMAL HIGH (ref 70–99)
Potassium: 3.7 mmol/L (ref 3.5–5.1)
Sodium: 137 mmol/L (ref 135–145)
Total Bilirubin: 0.4 mg/dL (ref 0.3–1.2)
Total Protein: 8.5 g/dL — ABNORMAL HIGH (ref 6.5–8.1)

## 2018-08-12 MED ORDER — FERROUS SULFATE 325 (65 FE) MG PO TBEC: 325.0000 mg | DELAYED_RELEASE_TABLET | ORAL | 5 refills | Status: DC

## 2018-08-12 NOTE — Telephone Encounter (Signed)
Patient to come for BMBX on 08/22/18 per patient.

## 2018-08-12 NOTE — Telephone Encounter (Signed)
Thank you for your help.  Dr. Maylon Peppers

## 2018-08-13 LAB — HEPATITIS C ANTIBODY (REFLEX): HCV Ab: <0.1 s/co ratio (ref 0.0–0.9)

## 2018-08-13 LAB — HEPATITIS B SURFACE ANTIGEN: Hepatitis B Surface Ag: NEGATIVE

## 2018-08-13 LAB — HCV COMMENT:

## 2018-08-13 LAB — HEPATITIS B CORE ANTIBODY, TOTAL: Hep B Core Total Ab: NEGATIVE

## 2018-08-13 LAB — HEPATITIS B SURFACE ANTIBODY,QUALITATIVE: Hep B S Ab: NONREACTIVE

## 2018-08-16 LAB — ZINC: Zinc: 74 ug/dL (ref 56–134)

## 2018-08-16 LAB — COPPER, SERUM: Copper: 79 ug/dL (ref 72–166)

## 2018-08-19 ENCOUNTER — Other Ambulatory Visit: Payer: Self-pay

## 2018-08-19 ENCOUNTER — Telehealth: Payer: Self-pay

## 2018-08-19 ENCOUNTER — Other Ambulatory Visit: Payer: Self-pay | Admitting: Adult Health

## 2018-08-19 DIAGNOSIS — D649 Anemia, unspecified: Secondary | ICD-10-CM

## 2018-08-19 DIAGNOSIS — D472 Monoclonal gammopathy: Secondary | ICD-10-CM

## 2018-08-19 DIAGNOSIS — D72819 Decreased white blood cell count, unspecified: Secondary | ICD-10-CM

## 2018-08-19 NOTE — Telephone Encounter (Signed)
Okay to use CBC from 2/14 for 2/24 bone marrow per Dr. Maylon Peppers.

## 2018-08-22 ENCOUNTER — Inpatient Hospital Stay (HOSPITAL_BASED_OUTPATIENT_CLINIC_OR_DEPARTMENT_OTHER): Payer: Medicare Other | Admitting: Adult Health

## 2018-08-22 ENCOUNTER — Inpatient Hospital Stay: Payer: Medicare Other

## 2018-08-22 VITALS — BP 133/75 | HR 61 | Temp 98.0°F | Resp 18

## 2018-08-22 DIAGNOSIS — D72819 Decreased white blood cell count, unspecified: Secondary | ICD-10-CM

## 2018-08-22 DIAGNOSIS — D649 Anemia, unspecified: Secondary | ICD-10-CM

## 2018-08-22 DIAGNOSIS — D472 Monoclonal gammopathy: Secondary | ICD-10-CM

## 2018-08-22 LAB — CBC WITH DIFFERENTIAL (CANCER CENTER ONLY)
Abs Immature Granulocytes: 0 10*3/uL (ref 0.00–0.07)
BASOS PCT: 0 %
Basophils Absolute: 0 10*3/uL (ref 0.0–0.1)
Eosinophils Absolute: 0.1 10*3/uL (ref 0.0–0.5)
Eosinophils Relative: 2 %
HCT: 36.7 % — ABNORMAL LOW (ref 39.0–52.0)
Hemoglobin: 12.1 g/dL — ABNORMAL LOW (ref 13.0–17.0)
Immature Granulocytes: 0 %
Lymphocytes Relative: 43 %
Lymphs Abs: 1.5 10*3/uL (ref 0.7–4.0)
MCH: 28.1 pg (ref 26.0–34.0)
MCHC: 33 g/dL (ref 30.0–36.0)
MCV: 85.2 fL (ref 80.0–100.0)
Monocytes Absolute: 0.4 10*3/uL (ref 0.1–1.0)
Monocytes Relative: 10 %
Neutro Abs: 1.6 10*3/uL — ABNORMAL LOW (ref 1.7–7.7)
Neutrophils Relative %: 45 %
Platelet Count: 171 10*3/uL (ref 150–400)
RBC: 4.31 MIL/uL (ref 4.22–5.81)
RDW: 13.9 % (ref 11.5–15.5)
WBC Count: 3.6 10*3/uL — ABNORMAL LOW (ref 4.0–10.5)
nRBC: 0 % (ref 0.0–0.2)

## 2018-08-22 NOTE — Progress Notes (Signed)
Pt site is clean dry and intact at d/c VSS. Discussed d/c instructions pt verbalized understanding.

## 2018-08-22 NOTE — Patient Instructions (Signed)

## 2018-08-22 NOTE — Progress Notes (Signed)
INDICATION: MGUS   Bone Marrow Biopsy and Aspiration Procedure Note   Informed consent was obtained and potential risks including bleeding, infection and pain were reviewed with the patient.  The patient's name, date of birth, identification, consent and allergies were verified prior to the start of procedure and time out was performed.  The left posterior iliac crest was chosen as the site of biopsy.  The skin was prepped with ChloraPrep.   16 cc of 2% lidocaine was used to provide local anaesthesia.   10 cc of bone marrow aspirate was obtained followed by 1cm biopsy.  Pressure was applied to the biopsy site and bandage was placed over the biopsy site. Patient was made to lie on the back for 30 mins prior to discharge.  The procedure was tolerated well. COMPLICATIONS: None BLOOD LOSS: none The patient was discharged home in stable condition with a 1 week follow up to review results.  Patient was provided with post bone marrow biopsy instructions and instructed to call if there was any bleeding or worsening pain.  Specimens sent for flow cytometry, cytogenetics and additional studies.  Signed Scot Dock, NP

## 2018-08-23 ENCOUNTER — Telehealth: Payer: Self-pay | Admitting: Adult Health

## 2018-08-23 NOTE — Telephone Encounter (Signed)
No los °

## 2018-09-01 ENCOUNTER — Encounter (HOSPITAL_COMMUNITY): Payer: Self-pay | Admitting: Hematology

## 2018-09-01 NOTE — Progress Notes (Signed)
[place] OFFICE PROGRESS NOTE  Patient Care Team: [name], MD as PCP - General (Family Medicine) Cordelia Poche, RN as Oncology Nurse Navigator Tish Men, MD as Medical Oncologist (Hematology)  HEME/ONC OVERVIEW: 1. Smodering IgG kappa multiple myeloma, high risk  -06/2018: SPEP showed M-spike 2.7 g/dL, no other myeloma labs  -Late 06/2018: baseline labs  Hgb 12, Cr 0.96, Ca 9.7  M-spike 2.5 g/dL, monoclonal IgG kappa, free kappa 130, quant IgG 3534, LDH 141, B2M 2.3  No FDG-avid lesions on PET   BM bx: 20% plasma cells, kappa light chain restricted, consistent w/ plasma cell neoplasm; FISH positive for trisomy 11   2. Incidental exophytic lesion of R kidney -06/2018: PET showed bilateral renal cysts, including an 10m exophytic hyperdense lesion from the right kidney, likely a complex cyst   TREATMENT REGIMEN:  08/2018 - present: close observation   PERTINENT NON-HEM/ONC PROBLEMS: 1. 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 76% at 2 years) and reduced the incidence of  progression due to end-organ damage  -We discussed the role of chemotherapy. The intent is for palliative. -We discussed some of the risks, benefits, side-effects of Revlimid & dexamethasone. -Some of the short term side-effects included, though not limited to, including risk of fatigue, weight gain, high blood sugar, high blood pressure, stomach ulcers, pancytopenia, allergic reactions, life-threatening infections, need for transfusions of blood products, nausea, vomiting, change in bowel habits, blood clots, admission to hospital for various reasons, and risks of death.  -Long term side-effects are also discussed including risks of infertility, permanent damage to nerve function, chronic fatigue, and rare secondary malignancy including bone marrow disorders and leukemia.  -The patient is aware that the response rates discussed earlier is not guaranteed.   -After a long discussion, patient made an informed decision to continue close observation for now -I counseled the patient on any concerning symptoms, such as unexplained weight loss, night sweats, lymphadenopathy, worsening fatigue, bone pain, or confusion, for which he should contact the clinic promptly for further evaluation   Normocytic anemia -Likely multifactorial, including underlying smoldering myeloma and mild iron deficiency -Colonoscopy up to date in 2019, normal  -Hgb 12.1 today, stable -Continue iron supplement for now  -If he develops worsening anemia (Hgb < 11) without any reversible cause, then patient understands that he would require treatment for myeloma   Leukopenia -Likely secondary to smoldering myeloma -WBC 3.6k with ANC 1600 -Patient denies any symptoms of infection -We will monitor it for now   Orders Placed This Encounter  Procedures  . CBC with Differential (Cancer Center Only)    Standing Status:  Future    Standing Expiration Date:   10/07/2019  . CMP ([facility] only)    Standing Status:   Future     Standing Expiration Date:   10/07/2019  . Lactate dehydrogenase    Standing Status:   Future    Standing Expiration Date:   10/07/2019  . Kappa/lambda light chains    Standing Status:   Future    Standing Expiration Date:   03/04/2020  . Multiple Myeloma Panel (SPEP&IFE w/QIG)    Standing Status:   Future    Standing Expiration Date:   10/07/2019  . Beta 2 microglobulin, serum    Standing Status:   Future    Standing Expiration Date:   10/07/2019   All questions were answered. The patient knows to call the clinic with any problems, questions or concerns. No barriers to learning was detected.  A total of more than 40 minutes were spent face-to-face with the patient during this encounter and over half of that time was spent on counseling and coordination of care as outlined above.   Return in 2 months for labs and clinic follow-up.   Tish Men, MD 09/02/2018 10:54 AM  CHIEF COMPLAINT: "I am here for my bone marrow biopsy results"  INTERVAL HISTORY: [name] returned to clinic for follow-up of her recent bone marrow biopsy results.  Patient reports that he tolerated bone marrow biopsy well without any significant bleeding, bruising or pain.  He has been playing golf and traveling to golf tournaments to watch professional golf games.  He reports feeling well, denies any complaint today.  REVIEW OF SYSTEMS:   Constitutional: ( - ) fevers, ( - )  chills , ( - ) night sweats Eyes: ( - ) blurriness of vision, ( - ) double vision, ( - ) watery eyes Ears, nose, mouth, throat, and face: ( - ) mucositis, ( - ) sore throat Respiratory: ( - ) cough, ( - ) dyspnea, ( - ) wheezes Cardiovascular: ( - ) palpitation, ( - ) chest discomfort, ( - ) lower extremity swelling Gastrointestinal:  ( - ) nausea, ( - ) heartburn, ( - ) change in bowel habits Skin: ( - ) abnormal skin rashes Lymphatics: ( - ) new lymphadenopathy, ( - ) easy bruising Neurological: ( - ) numbness, ( - ) tingling, ( - ) new  weaknesses Behavioral/Psych: ( - ) mood change, ( - ) new changes  All other systems were reviewed with the patient and are negative.  I have reviewed the past medical history, past surgical history, social history and family history with the patient and they are unchanged from previous note.  ALLERGIES:  is allergic to clindamycin hcl and metoprolol succinate.  MEDICATIONS:  Current Outpatient Medications  Medication Sig Dispense Refill  . aspirin EC 81 MG tablet Take 1 tablet (81 mg total) by mouth daily.    [name] atorvastatin (LIPITOR) 80 MG tablet Take 80 mg by mouth daily.    . ferrous sulfate 325 (65 FE) MG EC tablet Take 1 tablet (325 mg total) by mouth every other day for 30 days. 15 tablet 5  . JANUVIA 100 MG tablet Take 100 mg by mouth every morning.  3  . metFORMIN (GLUCOPHAGE) 500 MG tablet Take 1,000 mg by mouth 2 (two) times daily with a meal.    . omeprazole (PRILOSEC) 20 MG capsule Take 20 mg by mouth 2 (two) times daily before a meal.     . tadalafil (CIALIS) 20  MG tablet Take 1/2-1 tablet (10-20 mg) daily as needed    . Testosterone 25 MG/2.5GM (1%) GEL Apply topically.    . triamterene-hydrochlorothiazide (MAXZIDE-25) 37.5-25 MG tablet Take 1 tablet by mouth daily.    . valsartan (DIOVAN) 320 MG tablet Take 320 mg by mouth daily.     No current facility-administered medications for this visit.     PHYSICAL EXAMINATION: ECOG PERFORMANCE STATUS: 0 - Asymptomatic  Today's Vitals   09/02/18 1000  BP: (!) 143/83  Pulse: 66  Resp: 17  Temp: 97.7 F (36.5 C)  TempSrc: Oral  SpO2: 98%  Weight: 185 lb 5 oz (84.1 kg)  Height: '5\' 8"'  (1.727 m)  PainSc: 0-No pain   Body mass index is 28.18 kg/m.  Filed Weights   09/02/18 1000  Weight: 185 lb 5 oz (84.1 kg)    GENERAL: alert, no distress and comfortable SKIN: skin color, texture, turgor are normal, no rashes or significant lesions EYES: conjunctiva are pink and non-injected, sclera clear OROPHARYNX: no exudate, no  erythema; lips, buccal mucosa, and tongue normal  NECK: supple, non-tender LUNGS: clear to auscultation with normal breathing effort HEART: regular rate & rhythm and no murmurs and no lower extremity edema ABDOMEN: soft, non-tender, non-distended, normal bowel sounds Musculoskeletal: no cyanosis of digits and no clubbing  PSYCH: alert & oriented x 3, fluent speech NEURO: no focal motor/sensory deficits  LABORATORY DATA:  I have reviewed the data as listed    Component Value Date/Time   NA 137 08/12/2018 0901   NA 138 03/14/2018 0944   K 3.7 08/12/2018 0901   CL 99 08/12/2018 0901   CO2 30 08/12/2018 0901   GLUCOSE 177 (H) 08/12/2018 0901   BUN 18 08/12/2018 0901   BUN 23 03/14/2018 0944   CREATININE 1.19 08/12/2018 0901   CREATININE 1.12 09/03/2015 1201   CALCIUM 10.1 08/12/2018 0901   PROT 8.5 (H) 08/12/2018 0901   ALBUMIN 4.3 08/12/2018 0901   AST 18 08/12/2018 0901   ALT 22 08/12/2018 0901   ALKPHOS 44 08/12/2018 0901   BILITOT 0.4 08/12/2018 0901   GFRNONAA 60 (L) 08/12/2018 0901   GFRAA >60 08/12/2018 0901    No results found for: SPEP, UPEP  Lab Results  Component Value Date   WBC 3.6 (L) 08/22/2018   NEUTROABS 1.6 (L) 08/22/2018   HGB 12.1 (L) 08/22/2018   HCT 36.7 (L) 08/22/2018   MCV 85.2 08/22/2018   PLT 171 08/22/2018      Chemistry      Component Value Date/Time   NA 137 08/12/2018 0901   NA 138 03/14/2018 0944   K 3.7 08/12/2018 0901   CL 99 08/12/2018 0901   CO2 30 08/12/2018 0901   BUN 18 08/12/2018 0901   BUN 23 03/14/2018 0944   CREATININE 1.19 08/12/2018 0901   CREATININE 1.12 09/03/2015 1201      Component Value Date/Time   CALCIUM 10.1 08/12/2018 0901   ALKPHOS 44 08/12/2018 0901   AST 18 08/12/2018 0901   ALT 22 08/12/2018 0901   BILITOT 0.4 08/12/2018 0901       RADIOGRAPHIC STUDIES: I have personally reviewed the radiological images as listed below and agreed with the findings in the report. Nm Pet Image Initial (pi) Whole  Body  Result Date: 08/10/2018 CLINICAL DATA:  Initial treatment strategy for monoclonal gammopathy. EXAM: NUCLEAR MEDICINE PET WHOLE BODY TECHNIQUE: 7.8 mCi F-18 FDG was injected intravenously. Full-ring PET imaging was performed from the skull base to  thigh after the radiotracer. CT data was obtained and used for attenuation correction and anatomic localization. Fasting blood glucose: 152 mg/dl COMPARISON:  None. FINDINGS: Mediastinal blood pool activity: SUV max 3.0 HEAD/NECK: No significant abnormal hypermetabolic activity in this region. Incidental CT findings: Bilateral common carotid artery atherosclerotic calcification. CHEST: No significant abnormal hypermetabolic activity in this region. Incidental CT findings: Coronary, aortic arch, and branch vessel atherosclerotic vascular disease. Mild cardiomegaly. Paraseptal emphysema appreciable at the lung apices. ABDOMEN/PELVIS: No significant abnormal hypermetabolic activity in this region. Incidental CT findings: Bilateral renal cysts. A hyperdense 8 mm exophytic lesion from the right kidney upper pole is not appreciably hypermetabolic and likely simply a complex Bosniak category 2 cyst, although enhancement characteristics are not assessed today. Aortoiliac atherosclerotic vascular disease. Small umbilical hernia contains adipose tissue. Prostatomegaly. Scattered descending colon diverticula. SKELETON: No significant abnormal hypermetabolic activity in this region. Incidental CT findings: Right total knee prosthesis. EXTREMITIES: No significant abnormal hypermetabolic activity in this region. Incidental CT findings: none IMPRESSION: 1. No significant abnormal hypermetabolic lesions are identified. 2. Aortic Atherosclerosis (ICD10-I70.0) and Emphysema (ICD10-J43.9). Coronary atherosclerosis. 3. Bilateral renal cysts. An 8 mm exophytic hyperdense lesion from the right kidney upper pole this not appreciably hypermetabolic and is most likely to be a complex cyst.  4. Small umbilical hernia contains adipose tissue. 5. Prostatomegaly. Electronically Signed   By: Van Clines M.D.   On: 08/10/2018 09:47

## 2018-09-02 ENCOUNTER — Encounter: Payer: Self-pay | Admitting: Hematology

## 2018-09-02 ENCOUNTER — Inpatient Hospital Stay: Payer: Medicare Other | Attending: Hematology | Admitting: Hematology

## 2018-09-02 ENCOUNTER — Telehealth: Payer: Self-pay | Admitting: Hematology

## 2018-09-02 VITALS — BP 143/83 | HR 66 | Temp 97.7°F | Resp 17 | Ht 68.0 in | Wt 185.3 lb

## 2018-09-02 DIAGNOSIS — E1165 Type 2 diabetes mellitus with hyperglycemia: Secondary | ICD-10-CM | POA: Diagnosis not present

## 2018-09-02 DIAGNOSIS — D72819 Decreased white blood cell count, unspecified: Secondary | ICD-10-CM | POA: Insufficient documentation

## 2018-09-02 DIAGNOSIS — Z79899 Other long term (current) drug therapy: Secondary | ICD-10-CM | POA: Diagnosis not present

## 2018-09-02 DIAGNOSIS — Z7982 Long term (current) use of aspirin: Secondary | ICD-10-CM | POA: Insufficient documentation

## 2018-09-02 DIAGNOSIS — Z7984 Long term (current) use of oral hypoglycemic drugs: Secondary | ICD-10-CM | POA: Insufficient documentation

## 2018-09-02 DIAGNOSIS — D649 Anemia, unspecified: Secondary | ICD-10-CM | POA: Insufficient documentation

## 2018-09-02 DIAGNOSIS — C9000 Multiple myeloma not having achieved remission: Secondary | ICD-10-CM | POA: Diagnosis present

## 2018-09-02 DIAGNOSIS — D472 Monoclonal gammopathy: Secondary | ICD-10-CM

## 2018-09-02 NOTE — Telephone Encounter (Signed)
Appointments scheduled avs/calendar printed per 3/6 los °

## 2018-09-13 ENCOUNTER — Encounter: Payer: Self-pay | Admitting: *Deleted

## 2018-09-13 NOTE — Progress Notes (Signed)
Review of patient chart shows patient has been diagnosed with smoldering myeloma. He has chosen to stick with observation at this time. Since patient will not be receiving treatment, will discontinue active navigation at this time.

## 2018-11-04 ENCOUNTER — Inpatient Hospital Stay: Payer: Medicare Other | Admitting: Hematology

## 2018-11-04 ENCOUNTER — Inpatient Hospital Stay: Payer: Medicare Other | Attending: Hematology

## 2018-11-11 ENCOUNTER — Inpatient Hospital Stay: Payer: Medicare Other

## 2018-11-11 ENCOUNTER — Inpatient Hospital Stay: Payer: Medicare Other | Admitting: Hematology

## 2018-11-14 ENCOUNTER — Telehealth: Payer: Self-pay | Admitting: Hematology

## 2018-11-14 NOTE — Telephone Encounter (Signed)
Called and spoke with patient regarding appointments being rescheduled per 5/15 staff message/ letter/calendar mailed

## 2018-12-02 ENCOUNTER — Inpatient Hospital Stay (HOSPITAL_BASED_OUTPATIENT_CLINIC_OR_DEPARTMENT_OTHER): Payer: Medicare Other | Admitting: Hematology

## 2018-12-02 ENCOUNTER — Encounter: Payer: Self-pay | Admitting: Hematology

## 2018-12-02 ENCOUNTER — Other Ambulatory Visit: Payer: Self-pay

## 2018-12-02 ENCOUNTER — Inpatient Hospital Stay: Payer: Medicare Other | Attending: Hematology

## 2018-12-02 VITALS — BP 136/80 | HR 71 | Temp 97.9°F | Resp 18 | Ht 68.0 in | Wt 190.0 lb

## 2018-12-02 DIAGNOSIS — D472 Monoclonal gammopathy: Secondary | ICD-10-CM

## 2018-12-02 DIAGNOSIS — Z79899 Other long term (current) drug therapy: Secondary | ICD-10-CM

## 2018-12-02 DIAGNOSIS — E118 Type 2 diabetes mellitus with unspecified complications: Secondary | ICD-10-CM | POA: Insufficient documentation

## 2018-12-02 DIAGNOSIS — Z7984 Long term (current) use of oral hypoglycemic drugs: Secondary | ICD-10-CM | POA: Insufficient documentation

## 2018-12-02 DIAGNOSIS — U071 COVID-19: Secondary | ICD-10-CM

## 2018-12-02 DIAGNOSIS — D649 Anemia, unspecified: Secondary | ICD-10-CM | POA: Insufficient documentation

## 2018-12-02 DIAGNOSIS — Z7982 Long term (current) use of aspirin: Secondary | ICD-10-CM

## 2018-12-02 DIAGNOSIS — D72819 Decreased white blood cell count, unspecified: Secondary | ICD-10-CM | POA: Diagnosis not present

## 2018-12-02 DIAGNOSIS — C9000 Multiple myeloma not having achieved remission: Secondary | ICD-10-CM

## 2018-12-02 LAB — CBC WITH DIFFERENTIAL (CANCER CENTER ONLY)
Abs Immature Granulocytes: 0 10*3/uL (ref 0.00–0.07)
Basophils Absolute: 0 10*3/uL (ref 0.0–0.1)
Basophils Relative: 1 %
Eosinophils Absolute: 0.2 10*3/uL (ref 0.0–0.5)
Eosinophils Relative: 5 %
HCT: 34.6 % — ABNORMAL LOW (ref 39.0–52.0)
Hemoglobin: 11.7 g/dL — ABNORMAL LOW (ref 13.0–17.0)
Immature Granulocytes: 0 %
Lymphocytes Relative: 33 %
Lymphs Abs: 1 10*3/uL (ref 0.7–4.0)
MCH: 31.3 pg (ref 26.0–34.0)
MCHC: 33.8 g/dL (ref 30.0–36.0)
MCV: 92.5 fL (ref 80.0–100.0)
Monocytes Absolute: 0.2 10*3/uL (ref 0.1–1.0)
Monocytes Relative: 6 %
Neutro Abs: 1.7 10*3/uL (ref 1.7–7.7)
Neutrophils Relative %: 55 %
Platelet Count: 200 10*3/uL (ref 150–400)
RBC: 3.74 MIL/uL — ABNORMAL LOW (ref 4.22–5.81)
RDW: 14.6 % (ref 11.5–15.5)
WBC Count: 3.1 10*3/uL — ABNORMAL LOW (ref 4.0–10.5)
nRBC: 0 % (ref 0.0–0.2)

## 2018-12-02 LAB — CMP (CANCER CENTER ONLY)
ALT: 25 U/L (ref 0–44)
AST: 20 U/L (ref 15–41)
Albumin: 4.1 g/dL (ref 3.5–5.0)
Alkaline Phosphatase: 48 U/L (ref 38–126)
Anion gap: 8 (ref 5–15)
BUN: 17 mg/dL (ref 8–23)
CO2: 27 mmol/L (ref 22–32)
Calcium: 9.4 mg/dL (ref 8.9–10.3)
Chloride: 102 mmol/L (ref 98–111)
Creatinine: 1.09 mg/dL (ref 0.61–1.24)
GFR, Est AFR Am: >60 mL/min (ref >60)
GFR, Estimated: >60 mL/min (ref >60)
Glucose, Bld: 181 mg/dL — ABNORMAL HIGH (ref 70–99)
Potassium: 4 mmol/L (ref 3.5–5.1)
Sodium: 137 mmol/L (ref 135–145)
Total Bilirubin: 0.3 mg/dL (ref 0.3–1.2)
Total Protein: 8.9 g/dL — ABNORMAL HIGH (ref 6.5–8.1)

## 2018-12-02 NOTE — Progress Notes (Signed)
[place] OFFICE PROGRESS NOTE  Patient Care Team: [name], MD as PCP - General (Family Medicine) Cordelia Poche, RN as Oncology Nurse Navigator Tish Men, MD as Medical Oncologist (Hematology)  HEME/ONC OVERVIEW: 1. Smodering IgG kappa multiple myeloma, high risk  -06/2018: SPEP showed M-spike 2.7 g/dL, no other myeloma labs  -Late 06/2018: baseline labs  Hgb 12, Cr 0.96, Ca 9.7  M-spike 2.5 g/dL, monoclonal IgG kappa, free kappa 130, quant IgG 3534, LDH 141, B2M 2.3  No FDG-avid lesions on PET   BM bx: 20% plasma cells, kappa light chain restricted, consistent w/ plasma cell neoplasm; FISH positive for trisomy 11  -08/2018 - present: active surveillance; patient declined Rd for smoldering myeloma   2. Incidental exophytic lesion of R kidney -06/2018: PET showed bilateral renal cysts, including an 40m exophytic hyperdense lesion from the right kidney, likely a complex cyst   TREATMENT REGIMEN:  08/2018 - present: close observation   PERTINENT NON-HEM/ONC PROBLEMS: 1. Type II DM, not controlled   ASSESSMENT AND PLAN:  Smodering IgG kappa multiple myeloma  -We discussed starting Revlimid with dex for smoldering myeloma, including the benefits to delay disease progression and reduce the risk of end-organ damage -After lengthy discussions, patient declined treatment and elected for observation  -MM labs pending today, but Hgb, Cr and Ca remain relatively unchanged  -I counseled the patient on any concerning symptoms, such as unexplained weight loss, night sweats, lymphadenopathy, worsening fatigue, unexplained bone pain, confusion, for which he should contact the clinic for further evaluation  Normocytic anemia -Likely multifactorial, including underlying smoldering myeloma and mild iron deficiency -Up-to-date with colonoscopy (normal in 2019) -Hgb 11.7 today, stable; does not meet revised IMWG criteria for anemia (Hgb 2g/dL less than upper limit of  normal or < 10 g/dL)  -Patient could not tolerate oral iron supplement due to constipation -If anemia worsens, we can consider a trial of IV iron to see his anemia improves; if the anemia persists despite correction of iron deficiency, then we would have to contribute anemia to underlying myeloma, and he will require treatment   Leukopenia -Likely secondary to smoldering myeloma -WBC 3.1k today, stable -Patient was diagnosed with coronavirus (see below), which can cause slightly worsening of leukopenia -Clinically, he denies any fever, chill, chest pain, dyspnea, cough or sputum production -We will monitor for now  Recent COVID -Patient was diagnosed with coronavirus after he lost taste and smell -Taste is slowly improving but the smell remains limited -Repeat COVID testing negative -Continue follow-up with PCP   Orders Placed This Encounter  Procedures  . CBC with Differential (Cancer Center Only)    Standing Status:   Future    Standing Expiration Date:   01/06/2020  . [place]    Standing Status:   Future    Standing Expiration Date:   01/06/2020  . Save Smear ([facility])    Standing Status:   Future    Standing Expiration Date:   12/02/2019  . Ferritin    Standing Status:   Future    Standing Expiration Date:   01/06/2020  . Iron and TIBC    Standing Status:   Future    Standing Expiration Date:   01/06/2020  . Multiple Myeloma Panel (SPEP&IFE w/QIG)    Standing Status:   Future    Standing Expiration Date:   01/06/2020  . Kappa/lambda light chains    Standing Status:   Future    Standing Expiration Date:  06/02/2020  . Beta 2 microglobulin, serum    Standing Status:   Future    Standing Expiration Date:   01/06/2020   All questions were answered. The patient knows to call the clinic with any problems, questions or concerns. No barriers to learning was detected.  Return in 2 months for labs and clinic follow-up. Sooner if the patient develops any concerning  symptoms.   Tish Men, MD 12/02/2018 11:44 AM  CHIEF COMPLAINT: "I still can't smell anything "  INTERVAL HISTORY: [name] returns to clinic for follow-up of smoldering myeloma.  Patient reports that he lost his taste and smell a few weeks ago, and was sent for COVID-19 testing, which came back positive.  He denies any associated fever, respiratory or gastrointestinal symptoms.  Since then, he has had a repeat culture COVID testing that was negative.  His taste is slowly improving, but he still has very limited sense of smell.  He has been playing golf on a regular basis and doing very well.  He denies any other complaint today.  REVIEW OF SYSTEMS:   Constitutional: ( - ) fevers, ( - )  chills , ( - ) night sweats Eyes: ( - ) blurriness of vision, ( - ) double vision, ( - ) watery eyes Ears, nose, mouth, throat, and face: ( - ) mucositis, ( - ) sore throat Respiratory: ( - ) cough, ( - ) dyspnea, ( - ) wheezes Cardiovascular: ( - ) palpitation, ( - ) chest discomfort, ( - ) lower extremity swelling Gastrointestinal:  ( - ) nausea, ( - ) heartburn, ( - ) change in bowel habits Skin: ( - ) abnormal skin rashes Lymphatics: ( - ) new lymphadenopathy, ( - ) easy bruising Neurological: ( - ) numbness, ( - ) tingling, ( - ) new weaknesses Behavioral/Psych: ( - ) mood change, ( - ) new changes  All other systems were reviewed with the patient and are negative.  I have reviewed the past medical history, past surgical history, social history and family history with the patient and they are unchanged from previous note.  ALLERGIES:  is allergic to clindamycin hcl and metoprolol succinate.  MEDICATIONS:  Current Outpatient Medications  Medication Sig Dispense Refill  . aspirin EC 81 MG tablet Take 1 tablet (81 mg total) by mouth daily.    [name] atorvastatin (LIPITOR) 80 MG tablet Take 80 mg by mouth daily.    [name] JANUVIA 100 MG tablet Take 100 mg by mouth every morning.  3  . metFORMIN (GLUCOPHAGE)  500 MG tablet Take 1,000 mg by mouth 2 (two) times daily with a meal.    . omeprazole (PRILOSEC) 20 MG capsule Take 20 mg by mouth 2 (two) times daily before a meal.     . tadalafil (CIALIS) 20 MG tablet Take 1/2-1 tablet (10-20 mg) daily as needed    . Testosterone 25 MG/2.5GM (1%) GEL Apply topically.    . triamterene-hydrochlorothiazide (MAXZIDE-25) 37.5-25 MG tablet Take 1 tablet by mouth daily.    . valsartan (DIOVAN) 320 MG tablet Take 320 mg by mouth daily.    . ferrous sulfate 325 (65 FE) MG EC tablet Take 1 tablet (325 mg total) by mouth every other day for 30 days. 15 tablet 5   No current facility-administered medications for this visit.     PHYSICAL EXAMINATION: ECOG PERFORMANCE STATUS: 0 - Asymptomatic  Today's Vitals   12/02/18 1104  BP: 136/80  Pulse: 71  Resp: 18  Temp: 97.9 F (36.6 C)  TempSrc: Oral  SpO2: 98%  Weight: 190 lb (86.2 kg)  Height: _0  (1.727 m)  PainSc: 0-No pain   Body mass index is 28.89 kg/m.  Filed Weights   12/02/18 1104  Weight: 190 lb (86.2 kg)    GENERAL: alert, no distress and comfortable SKIN: skin color, texture, turgor are normal, no rashes or significant lesions EYES: conjunctiva are pink and non-injected, sclera clear OROPHARYNX: no exudate, no erythema; lips, buccal mucosa, and tongue normal  NECK: supple, non-tender LUNGS: clear to auscultation with normal breathing effort HEART: regular rate & rhythm and no murmurs and no lower extremity edema ABDOMEN: soft, non-tender, non-distended, normal bowel sounds Musculoskeletal: no cyanosis of digits and no clubbing  PSYCH: alert & oriented x 3, fluent speech NEURO: no focal motor/sensory deficits  LABORATORY DATA:  I have reviewed the data as listed    Component Value Date/Time   NA 137 12/02/2018 1048   NA 138 03/14/2018 0944   K 4.0 12/02/2018 1048   CL 102 12/02/2018 1048   CO2 27 12/02/2018 1048   GLUCOSE 181 (H) 12/02/2018 1048   BUN 17 12/02/2018 1048   BUN 23  03/14/2018 0944   CREATININE 1.09 12/02/2018 1048   CREATININE 1.12 09/03/2015 1201   CALCIUM 9.4 12/02/2018 1048   PROT 8.9 (H) 12/02/2018 1048   ALBUMIN 4.1 12/02/2018 1048   AST 20 12/02/2018 1048   ALT 25 12/02/2018 1048   ALKPHOS 48 12/02/2018 1048   BILITOT 0.3 12/02/2018 1048   GFRNONAA >60 12/02/2018 1048   GFRAA >60 12/02/2018 1048    No results found for: SPEP, UPEP  Lab Results  Component Value Date   WBC 3.1 (L) 12/02/2018   NEUTROABS 1.7 12/02/2018   HGB 11.7 (L) 12/02/2018   HCT 34.6 (L) 12/02/2018   MCV 92.5 12/02/2018   PLT 200 12/02/2018      Chemistry      Component Value Date/Time   NA 137 12/02/2018 1048   NA 138 03/14/2018 0944   K 4.0 12/02/2018 1048   CL 102 12/02/2018 1048   CO2 27 12/02/2018 1048   BUN 17 12/02/2018 1048   BUN 23 03/14/2018 0944   CREATININE 1.09 12/02/2018 1048   CREATININE 1.12 09/03/2015 1201      Component Value Date/Time   CALCIUM 9.4 12/02/2018 1048   ALKPHOS 48 12/02/2018 1048   AST 20 12/02/2018 1048   ALT 25 12/02/2018 1048   BILITOT 0.3 12/02/2018 1048

## 2018-12-05 LAB — BETA 2 MICROGLOBULIN, SERUM: Beta-2 Microglobulin: 3 mg/L — ABNORMAL HIGH (ref 0.6–2.4)

## 2018-12-05 LAB — KAPPA/LAMBDA LIGHT CHAINS
Kappa free light chain: 164.4 mg/L — ABNORMAL HIGH (ref 3.3–19.4)
Kappa, lambda light chain ratio: 23.15 — ABNORMAL HIGH (ref 0.26–1.65)
Lambda free light chains: 7.1 mg/L (ref 5.7–26.3)

## 2018-12-05 LAB — LACTATE DEHYDROGENASE: LDH: 154 U/L (ref 98–192)

## 2018-12-06 LAB — MULTIPLE MYELOMA PANEL, SERUM
Albumin SerPl Elph-Mcnc: 3.7 g/dL (ref 2.9–4.4)
Albumin/Glob SerPl: 0.9 (ref 0.7–1.7)
Alpha 1: 0.2 g/dL (ref 0.0–0.4)
Alpha2 Glob SerPl Elph-Mcnc: 0.7 g/dL (ref 0.4–1.0)
B-Globulin SerPl Elph-Mcnc: 0.9 g/dL (ref 0.7–1.3)
Gamma Glob SerPl Elph-Mcnc: 2.8 g/dL — ABNORMAL HIGH (ref 0.4–1.8)
Globulin, Total: 4.6 g/dL — ABNORMAL HIGH (ref 2.2–3.9)
IgA: 43 mg/dL — ABNORMAL LOW (ref 61–437)
IgG (Immunoglobin G), Serum: 3964 mg/dL — ABNORMAL HIGH (ref 603–1613)
IgM (Immunoglobulin M), Srm: 24 mg/dL (ref 15–143)
M Protein SerPl Elph-Mcnc: 2.5 g/dL — ABNORMAL HIGH
Total Protein ELP: 8.3 g/dL (ref 6.0–8.5)

## 2019-02-03 ENCOUNTER — Encounter: Payer: Self-pay | Admitting: Hematology

## 2019-02-03 ENCOUNTER — Telehealth: Payer: Self-pay | Admitting: Hematology

## 2019-02-03 ENCOUNTER — Inpatient Hospital Stay (HOSPITAL_BASED_OUTPATIENT_CLINIC_OR_DEPARTMENT_OTHER): Payer: Medicare Other | Admitting: Hematology

## 2019-02-03 ENCOUNTER — Other Ambulatory Visit: Payer: Self-pay

## 2019-02-03 ENCOUNTER — Inpatient Hospital Stay: Payer: Medicare Other | Attending: Hematology

## 2019-02-03 DIAGNOSIS — C9000 Multiple myeloma not having achieved remission: Secondary | ICD-10-CM

## 2019-02-03 DIAGNOSIS — Z79899 Other long term (current) drug therapy: Secondary | ICD-10-CM | POA: Insufficient documentation

## 2019-02-03 DIAGNOSIS — D649 Anemia, unspecified: Secondary | ICD-10-CM | POA: Insufficient documentation

## 2019-02-03 DIAGNOSIS — D72819 Decreased white blood cell count, unspecified: Secondary | ICD-10-CM

## 2019-02-03 DIAGNOSIS — D472 Monoclonal gammopathy: Secondary | ICD-10-CM

## 2019-02-03 LAB — CMP (CANCER CENTER ONLY)
ALT: 23 U/L (ref 0–44)
AST: 20 U/L (ref 15–41)
Albumin: 3.9 g/dL (ref 3.5–5.0)
Alkaline Phosphatase: 46 U/L (ref 38–126)
Anion gap: 7 (ref 5–15)
BUN: 15 mg/dL (ref 8–23)
CO2: 30 mmol/L (ref 22–32)
Calcium: 9.2 mg/dL (ref 8.9–10.3)
Chloride: 98 mmol/L (ref 98–111)
Creatinine: 1.06 mg/dL (ref 0.61–1.24)
GFR, Est AFR Am: >60 mL/min (ref >60)
GFR, Estimated: >60 mL/min (ref >60)
Glucose, Bld: 168 mg/dL — ABNORMAL HIGH (ref 70–99)
Potassium: 3.7 mmol/L (ref 3.5–5.1)
Sodium: 135 mmol/L (ref 135–145)
Total Bilirubin: 0.3 mg/dL (ref 0.3–1.2)
Total Protein: 8.5 g/dL — ABNORMAL HIGH (ref 6.5–8.1)

## 2019-02-03 LAB — CBC WITH DIFFERENTIAL (CANCER CENTER ONLY)
Abs Immature Granulocytes: 0.01 10*3/uL (ref 0.00–0.07)
Basophils Absolute: 0 10*3/uL (ref 0.0–0.1)
Basophils Relative: 1 %
Eosinophils Absolute: 0.1 10*3/uL (ref 0.0–0.5)
Eosinophils Relative: 3 %
HCT: 38.1 % — ABNORMAL LOW (ref 39.0–52.0)
Hemoglobin: 12.3 g/dL — ABNORMAL LOW (ref 13.0–17.0)
Immature Granulocytes: 0 %
Lymphocytes Relative: 37 %
Lymphs Abs: 1.2 10*3/uL (ref 0.7–4.0)
MCH: 29.5 pg (ref 26.0–34.0)
MCHC: 32.3 g/dL (ref 30.0–36.0)
MCV: 91.4 fL (ref 80.0–100.0)
Monocytes Absolute: 0.3 10*3/uL (ref 0.1–1.0)
Monocytes Relative: 9 %
Neutro Abs: 1.6 10*3/uL — ABNORMAL LOW (ref 1.7–7.7)
Neutrophils Relative %: 50 %
Platelet Count: 253 10*3/uL (ref 150–400)
RBC: 4.17 MIL/uL — ABNORMAL LOW (ref 4.22–5.81)
RDW: 13.2 % (ref 11.5–15.5)
WBC Count: 3.3 10*3/uL — ABNORMAL LOW (ref 4.0–10.5)
nRBC: 0 % (ref 0.0–0.2)

## 2019-02-03 LAB — SAVE SMEAR(SSMR), FOR PROVIDER SLIDE REVIEW

## 2019-02-03 LAB — FERRITIN: Ferritin: 10 ng/mL — ABNORMAL LOW (ref 24–336)

## 2019-02-03 LAB — IRON AND TIBC
Iron: 36 ug/dL — ABNORMAL LOW (ref 42–163)
Saturation Ratios: 8 % — ABNORMAL LOW (ref 20–55)
TIBC: 466 ug/dL — ABNORMAL HIGH (ref 202–409)
UIBC: 431 ug/dL — ABNORMAL HIGH (ref 117–376)

## 2019-02-03 NOTE — Progress Notes (Addendum)
[place] OFFICE PROGRESS NOTE  Patient Care Team: [name], MD as PCP - General (Family Medicine) Cordelia Poche, RN as Oncology Nurse Navigator Tish Men, MD as Medical Oncologist (Hematology)  HEME/ONC OVERVIEW: 1. Smodering IgG kappa multiple myeloma, high risk  -06/2018: SPEP showed M-spike 2.7 g/dL, no other myeloma labs  -Late 06/2018: baseline labs  Hgb 12, Cr 0.96, Ca 9.7  M-spike 2.5 g/dL, monoclonal IgG kappa, free kappa 130, quant IgG 3534, LDH 141, B2M 2.3  No FDG-avid lesions on PET   BM bx: 20% plasma cells, kappa light chain restricted, consistent w/ plasma cell neoplasm; FISH positive for trisomy 11  -08/2018 - present: active surveillance; patient declined Rd for smoldering myeloma   2. Incidental exophytic lesion of R kidney -06/2018: PET showed bilateral renal cysts, including an 69m exophytic hyperdense lesion from the right kidney, likely a complex cyst   TREATMENT REGIMEN:  08/2018 - present: close observation   PERTINENT NON-HEM/ONC PROBLEMS: 1. Type II DM, not controlled   ASSESSMENT AND PLAN iron profile pending  Smodering IgG kappa multiple myeloma, high risk  -We have had several discussions regarding starting Revlimid/dexamethasone for smoldering myeloma, including the benefits of delaying disease progression and reducing the risk of endorgan damage -Patient has elected to continue observation for now -Myeloma labs pending today -We will plan to monitor his labs [id number] assess for any interval changes -I counseled the patient on any concerning symptoms, such as unexplained weight loss, night sweats, lymphadenopathy, unexplained bone pain, worsening anemia/renal function or rising calcium on routine blood work, for which he is instructed to contact the clinic ASAP  Normocytic anemia -Likely multifactorial, including underlying smoldering myeloma and mild iron deficiency -Up-to-date with colonoscopy (normal in  2019) -Hgb 12.3 today, stable; iron profile consistent with iron deficiency -Unable to tolerate oral iron supplement due to constipation -We discussed some of the risks, benefits, and alternatives of intravenous iron infusions.  -The patient's iron level is very low and is unable to tolerate oral iron; in addition, given the unclear contribution from smoldering myeloma vs. Iron deficiency, iron repletion can help eliminate iron deficiency as a cause of the anemia and provide more accurate monitoring of smoldering myeloma -Some of the side-effects to be expected including risks of infusion reactions, phlebitis, headaches, nausea and fatigue.   -The patient is willing to proceed, 1st dose on  -Goal is to keep ferritin level greater than 50.   -We will monitor for now -If anemia worsens, we can consider a trial of IV iron to see his anemia improves; if the anemia persists despite correction of iron deficiency, then we would have to contribute anemia to underlying myeloma, and he will require treatment   Leukopenia -Likely secondary to smoldering myeloma -WBC 3.3k today, stable -Clinically, he denies any constitutional symptoms or symptoms of infection -We will monitor for now  Orders Placed This Encounter  Procedures  . CBC with Differential (Cancer Center Only)    Standing Status:   Future    Standing Expiration Date:   03/09/2020  . CMP ([facility])    Standing Status:   Future    Standing Expiration Date:   03/09/2020  . Multiple Myeloma Panel (SPEP&IFE w/QIG)    Standing Status:   Future    Standing Expiration Date:   03/09/2020  . Kappa/lambda light chains    Standing Status:   Future    Standing Expiration Date:   08/05/2020   All questions were  answered. The patient knows to call the clinic with any problems, questions or concerns. No barriers to learning was detected.  Return in 3 months for labs and clinic follow-up.   Tish Men, MD 02/03/2019 11:10 AM  CHIEF  COMPLAINT: "I am feeling great"  INTERVAL HISTORY: [name] returns to clinic for follow-up of smoldering myeloma constipation.  He reports that he has been playing golf several times a week without any limitation.  He feels very well and denies any constitutional symptoms, chest pain, dyspnea, abdominal pain, nausea, vomiting, diarrhea, abnormal bleeding/bruising, or any bone pain.  He denies any complaint today.  SUMMARY OF ONCOLOGIC HISTORY: Oncology History   No history exists.    REVIEW OF SYSTEMS:   Constitutional: ( - ) fevers, ( - )  chills , ( - ) night sweats Eyes: ( - ) blurriness of vision, ( - ) double vision, ( - ) watery eyes Ears, nose, mouth, throat, and face: ( - ) mucositis, ( - ) sore throat Respiratory: ( - ) cough, ( - ) dyspnea, ( - ) wheezes Cardiovascular: ( - ) palpitation, ( - ) chest discomfort, ( - ) lower extremity swelling Gastrointestinal:  ( - ) nausea, ( - ) heartburn, ( - ) change in bowel habits Skin: ( - ) abnormal skin rashes Lymphatics: ( - ) new lymphadenopathy, ( - ) easy bruising Neurological: ( - ) numbness, ( - ) tingling, ( - ) new weaknesses Behavioral/Psych: ( - ) mood change, ( - ) new changes  All other systems were reviewed with the patient and are negative.  I have reviewed the past medical history, past surgical history, social history and family history with the patient and they are unchanged from previous note.  ALLERGIES:  is allergic to clindamycin hcl and metoprolol succinate.  MEDICATIONS:  Current Outpatient Medications  Medication Sig Dispense Refill  . aspirin EC 81 MG tablet Take 1 tablet (81 mg total) by mouth daily.    [name] atorvastatin (LIPITOR) 80 MG tablet Take 80 mg by mouth daily.    . ferrous sulfate 325 (65 FE) MG EC tablet Take 1 tablet (325 mg total) by mouth every other day for 30 days. 15 tablet 5  . JANUVIA 100 MG tablet Take 100 mg by mouth every morning.  3  . metFORMIN (GLUCOPHAGE) 500 MG tablet Take  1,000 mg by mouth 2 (two) times daily with a meal.    . omeprazole (PRILOSEC) 20 MG capsule Take 20 mg by mouth 2 (two) times daily before a meal.     . tadalafil (CIALIS) 20 MG tablet Take 1/2-1 tablet (10-20 mg) daily as needed    . Testosterone 25 MG/2.5GM (1%) GEL Apply topically.    . triamterene-hydrochlorothiazide (MAXZIDE-25) 37.5-25 MG tablet Take 1 tablet by mouth daily.    . valsartan (DIOVAN) 320 MG tablet Take 320 mg by mouth daily.     No current facility-administered medications for this visit.     PHYSICAL EXAMINATION: ECOG PERFORMANCE STATUS: 0 - Asymptomatic  There were no vitals filed for this visit. There is no height or weight on file to calculate BMI.  There were no vitals filed for this visit.  Temp 98.1  BP 100/57 HR 71 O2 saturation: 98% on RA   GENERAL: alert, no distress and comfortable SKIN: skin color, texture, turgor are normal, no rashes or significant lesions EYES: conjunctiva are pink and non-injected, sclera clear OROPHARYNX: no exudate, no erythema; lips, buccal mucosa, and  tongue normal  NECK: supple, non-tender LYMPH:  no palpable lymphadenopathy in the cervical LUNGS: clear to auscultation with normal breathing effort HEART: regular rate & rhythm and no murmurs and no lower extremity edema ABDOMEN: soft, non-tender, non-distended, normal bowel sounds Musculoskeletal: no cyanosis of digits and no clubbing  PSYCH: alert & oriented x 3, fluent speech NEURO: no focal motor/sensory deficits  LABORATORY DATA:  I have reviewed the data as listed    Component Value Date/Time   NA 135 02/03/2019 1001   NA 138 03/14/2018 0944   K 3.7 02/03/2019 1001   CL 98 02/03/2019 1001   CO2 30 02/03/2019 1001   GLUCOSE 168 (H) 02/03/2019 1001   BUN 15 02/03/2019 1001   BUN 23 03/14/2018 0944   CREATININE 1.06 02/03/2019 1001   CREATININE 1.12 09/03/2015 1201   CALCIUM 9.2 02/03/2019 1001   PROT 8.5 (H) 02/03/2019 1001   ALBUMIN 3.9 02/03/2019 1001    AST 20 02/03/2019 1001   ALT 23 02/03/2019 1001   ALKPHOS 46 02/03/2019 1001   BILITOT 0.3 02/03/2019 1001   GFRNONAA >60 02/03/2019 1001   GFRAA >60 02/03/2019 1001    No results found for: SPEP, UPEP  Lab Results  Component Value Date   WBC 3.3 (L) 02/03/2019   NEUTROABS 1.6 (L) 02/03/2019   HGB 12.3 (L) 02/03/2019   HCT 38.1 (L) 02/03/2019   MCV 91.4 02/03/2019   PLT 253 02/03/2019      Chemistry      Component Value Date/Time   NA 135 02/03/2019 1001   NA 138 03/14/2018 0944   K 3.7 02/03/2019 1001   CL 98 02/03/2019 1001   CO2 30 02/03/2019 1001   BUN 15 02/03/2019 1001   BUN 23 03/14/2018 0944   CREATININE 1.06 02/03/2019 1001   CREATININE 1.12 09/03/2015 1201      Component Value Date/Time   CALCIUM 9.2 02/03/2019 1001   ALKPHOS 46 02/03/2019 1001   AST 20 02/03/2019 1001   ALT 23 02/03/2019 1001   BILITOT 0.3 02/03/2019 1001       RADIOGRAPHIC STUDIES: I have personally reviewed the radiological images as listed below and agreed with the findings in the report. No results found.

## 2019-02-03 NOTE — Telephone Encounter (Signed)
Called and lmvm for patient with date/time of follow up appointment per 8/7 los

## 2019-02-03 NOTE — Addendum Note (Signed)
Addended by: Tish Men on: 02/03/2019 01:11 PM   Modules accepted: Orders

## 2019-02-03 NOTE — Telephone Encounter (Signed)
Called and LMVM for patient regarding appointments added for iron infusion per 8/7 los .  Date/time was given/  Dr Maylon Peppers was ok with  Waiting until 8/17 for first infusion

## 2019-02-04 LAB — BETA 2 MICROGLOBULIN, SERUM: Beta-2 Microglobulin: 2.3 mg/L (ref 0.6–2.4)

## 2019-02-05 LAB — KAPPA/LAMBDA LIGHT CHAINS
Kappa free light chain: 171.1 mg/L — ABNORMAL HIGH (ref 3.3–19.4)
Kappa, lambda light chain ratio: 24.1 — ABNORMAL HIGH (ref 0.26–1.65)
Lambda free light chains: 7.1 mg/L (ref 5.7–26.3)

## 2019-02-07 ENCOUNTER — Telehealth: Payer: Self-pay | Admitting: *Deleted

## 2019-02-07 LAB — MULTIPLE MYELOMA PANEL, SERUM
Albumin SerPl Elph-Mcnc: 3.9 g/dL (ref 2.9–4.4)
Albumin/Glob SerPl: 0.9 (ref 0.7–1.7)
Alpha 1: 0.2 g/dL (ref 0.0–0.4)
Alpha2 Glob SerPl Elph-Mcnc: 0.9 g/dL (ref 0.4–1.0)
B-Globulin SerPl Elph-Mcnc: 0.8 g/dL (ref 0.7–1.3)
Gamma Glob SerPl Elph-Mcnc: 2.8 g/dL — ABNORMAL HIGH (ref 0.4–1.8)
Globulin, Total: 4.7 g/dL — ABNORMAL HIGH (ref 2.2–3.9)
IgA: 40 mg/dL — ABNORMAL LOW (ref 61–437)
IgG (Immunoglobin G), Serum: 3626 mg/dL — ABNORMAL HIGH (ref 603–1613)
IgM (Immunoglobulin M), Srm: 20 mg/dL (ref 15–143)
M Protein SerPl Elph-Mcnc: 2.6 g/dL — ABNORMAL HIGH
Total Protein ELP: 8.6 g/dL — ABNORMAL HIGH (ref 6.0–8.5)

## 2019-02-07 NOTE — Telephone Encounter (Signed)
As noted below by Dr. Maylon Peppers, I informed the patient of his M-spike level. He verbalized understanding.

## 2019-02-07 NOTE — Telephone Encounter (Signed)
-----   Message from Tish Men, MD sent at 02/07/2019  1:55 PM EDT ----- Delrae Sawyers, Can you let Mr. Timme know that his M-spike is essentially unchanged at 2.6 g/dL and we will see him back in 3 months as scheduled?Thanks.  [place]  ----- Message ----- From: Buel Ream, [facility] [place] Sent: 02/03/2019  10:15 AM EDT To: Tish Men, MD

## 2019-02-13 ENCOUNTER — Other Ambulatory Visit: Payer: Self-pay

## 2019-02-13 ENCOUNTER — Inpatient Hospital Stay: Payer: Medicare Other

## 2019-02-13 VITALS — BP 153/82 | HR 58 | Temp 97.0°F | Resp 20

## 2019-02-13 DIAGNOSIS — D649 Anemia, unspecified: Secondary | ICD-10-CM

## 2019-02-13 DIAGNOSIS — C9000 Multiple myeloma not having achieved remission: Secondary | ICD-10-CM | POA: Diagnosis not present

## 2019-02-13 MED ORDER — SODIUM CHLORIDE 0.9 % IV SOLN
510.0000 mg | Freq: Once | INTRAVENOUS | Status: AC
  Administered 2019-02-13: 510 mg via INTRAVENOUS
  Filled 2019-02-13: qty 510

## 2019-02-13 MED ORDER — SODIUM CHLORIDE 0.9 % IV SOLN
Freq: Once | INTRAVENOUS | Status: AC
  Administered 2019-02-13: 09:00:00 via INTRAVENOUS
  Filled 2019-02-13: qty 250

## 2019-02-13 NOTE — Patient Instructions (Signed)

## 2019-02-20 ENCOUNTER — Inpatient Hospital Stay: Payer: Medicare Other

## 2019-02-20 ENCOUNTER — Other Ambulatory Visit: Payer: Self-pay

## 2019-02-20 VITALS — BP 131/67 | HR 72 | Temp 97.5°F | Resp 18

## 2019-02-20 DIAGNOSIS — D649 Anemia, unspecified: Secondary | ICD-10-CM

## 2019-02-20 DIAGNOSIS — C9000 Multiple myeloma not having achieved remission: Secondary | ICD-10-CM | POA: Diagnosis not present

## 2019-02-20 MED ORDER — SODIUM CHLORIDE 0.9 % IV SOLN
Freq: Once | INTRAVENOUS | Status: AC
  Administered 2019-02-20: 12:00:00 via INTRAVENOUS
  Filled 2019-02-20: qty 250

## 2019-02-20 MED ORDER — SODIUM CHLORIDE 0.9 % IV SOLN
510.0000 mg | Freq: Once | INTRAVENOUS | Status: AC
  Administered 2019-02-20: 510 mg via INTRAVENOUS
  Filled 2019-02-20: qty 510

## 2019-02-20 NOTE — Patient Instructions (Signed)
Ferumoxytol injection (Feraheme) What is this medicine? FERUMOXYTOL is an iron complex. Iron is used to make healthy red blood cells, which carry oxygen and nutrients throughout the body. This medicine is used to treat iron deficiency anemia. This medicine may be used for other purposes; ask your health care provider or pharmacist if you have questions. COMMON BRAND NAME(S): Feraheme What should I tell my health care provider before I take this medicine? They need to know if you have any of these conditions:  anemia not caused by low iron levels  high levels of iron in the blood  magnetic resonance imaging (MRI) test scheduled  an unusual or allergic reaction to iron, other medicines, foods, dyes, or preservatives  pregnant or trying to get pregnant  breast-feeding How should I use this medicine? This medicine is for injection into a vein. It is given by a health care professional in a hospital or clinic setting. Talk to your pediatrician regarding the use of this medicine in children. Special care may be needed. Overdosage: If you think you have taken too much of this medicine contact a poison control center or emergency room at once. NOTE: This medicine is only for you. Do not share this medicine with others. What if I miss a dose? It is important not to miss your dose. Call your doctor or health care professional if you are unable to keep an appointment. What may interact with this medicine? This medicine may interact with the following medications:  other iron products This list may not describe all possible interactions. Give your health care provider a list of all the medicines, herbs, non-prescription drugs, or dietary supplements you use. Also tell them if you smoke, drink alcohol, or use illegal drugs. Some items may interact with your medicine. What should I watch for while using this medicine? Visit your doctor or healthcare professional regularly. Tell your doctor or  healthcare professional if your symptoms do not start to get better or if they get worse. You may need blood work done while you are taking this medicine. You may need to follow a special diet. Talk to your doctor. Foods that contain iron include: whole grains/cereals, dried fruits, beans, or peas, leafy green vegetables, and organ meats (liver, kidney). What side effects may I notice from receiving this medicine? Side effects that you should report to your doctor or health care professional as soon as possible:  allergic reactions like skin rash, itching or hives, swelling of the face, lips, or tongue  breathing problems  changes in blood pressure  feeling faint or lightheaded, falls  fever or chills  flushing, sweating, or hot feelings  swelling of the ankles or feet Side effects that usually do not require medical attention (report to your doctor or health care professional if they continue or are bothersome):  diarrhea  headache  nausea, vomiting  stomach pain This list may not describe all possible side effects. Call your doctor for medical advice about side effects. You may report side effects to FDA at 1-800-FDA-1088. Where should I keep my medicine? This drug is given in a hospital or clinic and will not be stored at home. NOTE: This sheet is a summary. It may not cover all possible information. If you have questions about this medicine, talk to your doctor, pharmacist, or health care provider.  2020 Elsevier/Gold Standard (2016-08-03 20:21:10)  

## 2019-02-20 NOTE — Progress Notes (Signed)
Patient does not want to stay for the 30 minute post Iron infusion observation. Patient discharged ambulatory without complaints or concerns.

## 2019-03-09 ENCOUNTER — Telehealth: Payer: Self-pay | Admitting: Hematology

## 2019-03-09 NOTE — Telephone Encounter (Signed)
Called and spoke with patient regarding appointments being moved from 11/6    Due to PAL.  Patient was ok with new date/time

## 2019-04-07 ENCOUNTER — Other Ambulatory Visit: Payer: Self-pay

## 2019-04-07 ENCOUNTER — Encounter: Payer: Self-pay | Admitting: Internal Medicine

## 2019-04-07 ENCOUNTER — Ambulatory Visit (INDEPENDENT_AMBULATORY_CARE_PROVIDER_SITE_OTHER): Payer: Medicare Other | Admitting: Internal Medicine

## 2019-04-07 VITALS — BP 132/86 | HR 67 | Ht 68.0 in | Wt 191.4 lb

## 2019-04-07 DIAGNOSIS — I712 Thoracic aortic aneurysm, without rupture, unspecified: Secondary | ICD-10-CM

## 2019-04-07 DIAGNOSIS — I10 Essential (primary) hypertension: Secondary | ICD-10-CM

## 2019-04-07 LAB — BASIC METABOLIC PANEL
BUN/Creatinine Ratio: 19 (ref 10–24)
BUN: 18 mg/dL (ref 8–27)
CO2: 23 mmol/L (ref 20–29)
Calcium: 10.1 mg/dL (ref 8.6–10.2)
Chloride: 99 mmol/L (ref 96–106)
Creatinine, Ser: 0.94 mg/dL (ref 0.76–1.27)
GFR calc Af Amer: 91 mL/min/{1.73_m2} (ref >59)
GFR calc non Af Amer: 79 mL/min/{1.73_m2} (ref >59)
Glucose: 130 mg/dL — ABNORMAL HIGH (ref 65–99)
Potassium: 4 mmol/L (ref 3.5–5.2)
Sodium: 135 mmol/L (ref 134–144)

## 2019-04-07 NOTE — Progress Notes (Signed)
Patient Care Team: [name], MD as PCP - General (Family Medicine) Cordelia Poche, RN as Oncology Nurse Navigator Tish Men, MD as Medical Oncologist (Hematology)   HPI  [name] is a 73 y.o. male Seen in followup for a history of a catheterization in 2008 demonstrate severe LAD disease and moderate circumflex disease with an occluded posterolateral branch. He had normal LV function with perfusion abnormality on Myoview scan is wanting to that distribution. He underwent DES stenting  He also has a thoracic aortic aneurysm  Intercurrently diagnosed with monoclonal gammopathy -- no therapy indicated,    DATE TEST   9/16 CTA 4.3 cm   3/17 CTA 4.3 cm  5/18 CTA 4.3 cm  9/19 CTA 4.3       He is currently on labetolol and losartan      DATE TEST EF   *2013* Echo   55-60 %   9/19* Myoview  52 % No ischemia/Infarct       The patient denies chest pain, shortness of breath, nocturnal dyspnea, orthopnea or peripheral edema.  There have been no palpitations, lightheadedness or syncope.    This summer had an 85>>82 on the golf [place].   Past Medical History:  Diagnosis Date  . Anemia   . BPH (benign prostatic hyperplasia)   . CAD (coronary artery disease)   . Collagen vascular disease ([place])   . Colon polyp   . Diabetes mellitus without complication ([place])   . Esophageal reflux   . Fe deficiency anemia 2012  . FHx: colonic polyps 11/04, 01/2009  . Gastritis   . Hearing loss    wears hearing aid  . Heart attack ([place])   . Hemorrhoids, internal   . Hyperlipidemia   . Hypertension   . Hypogonadism male   . Smoldering myeloma ([place])   . Smoldering myeloma ([place])   . Wears glasses     Past Surgical History:  Procedure Laterality Date  . APPENDECTOMY    . CORONARY ANGIOPLASTY WITH STENT PLACEMENT  2007  . HAND SURGERY     left 2010, right 2012  . [place]   left  . left knee     x3  . left shoulder    . PROSTATE SURGERY    . Right CTS      ?  . ROTATOR CUFF REPAIR Right 12/2012  . TOTAL KNEE ARTHROPLASTY Right 03/08/2015   Procedure: RIGHT TOTAL KNEE ARTHROPLASTY;  Surgeon: Sydnee Cabal, MD;  Location: [facility];  Service: Orthopedics;  Laterality: Right;  [name] VASECTOMY      Current Outpatient Medications  Medication Sig Dispense Refill  . atorvastatin (LIPITOR) 80 MG tablet Take 80 mg by mouth daily.    [name] JANUVIA 100 MG tablet Take 100 mg by mouth every morning.  3  . metFORMIN (GLUCOPHAGE-XR) 500 MG 24 hr tablet Take 4 tablets by mouth 2 (two) times daily.    [name] omeprazole (PRILOSEC) 20 MG capsule Take 20 mg by mouth 2 (two) times daily before a meal.     . tadalafil (CIALIS) 20 MG tablet Take 1/2-1 tablet (10-20 mg) daily as needed    . Testosterone 25 MG/2.5GM (1%) GEL Place 1 mg onto the skin every other day.    . triamterene-hydrochlorothiazide (MAXZIDE-25) 37.5-25 MG tablet Take 1 tablet by mouth daily.    . valsartan (DIOVAN) 320 MG tablet Take 320 mg by mouth daily.     No current facility-administered medications  for this visit.     Allergies  Allergen Reactions  . Clindamycin Hcl Itching  . Metoprolol Succinate Other (See Comments)    Pt does not recall.     Review of Systems negative except from HPI and PMH  Physical Exam BP 132/86   Pulse 67   Ht 5\' 8"  (1.727 m)   Wt 191 lb 6.4 oz (86.8 kg)   SpO2 97%   BMI 29.10 kg/m  Well developed and nourished in no acute distress HENT normal Neck supple with JVP-  Flat Carotids without bruits Clear Regular rate and rhythm, no murmurs or gallops Abd-soft with active BS No Clubbing cyanosis edema Skin-warm and dry A & Oriented  Grossly normal sensory and motor function  ECG sinus @ 67 20/15/43 RBBB   Assessment and  Plan  CAD  S/p stenting  Ascending aortic aneurysm   Hypertension  Monoclonal Gammopathy  RBBB  Without symptoms of ischemia  BP well controlled  Will recheck CT for aneurysm  -- annual surveillance

## 2019-04-07 NOTE — Patient Instructions (Signed)
Medication Instructions:  Your physician recommends that you continue on your current medications as directed. Please refer to the Current Medication list given to you today.  Labwork: None ordered.  Testing/Procedures: Non-Cardiac CT Angiography (CTA), is a special type of CT scan that uses a computer to produce multi-dimensional views of major blood vessels throughout the body. In CT angiography, a contrast material is injected through an IV to help visualize the blood vessels  Follow-Up: Your physician recommends that you schedule a follow-up appointment in:   12 months with Dr. Caryl Comes  Any Other Special Instructions Will Be Listed Below (If Applicable).     If you need a refill on your cardiac medications before your next appointment, please call your pharmacy.

## 2019-04-26 ENCOUNTER — Other Ambulatory Visit: Payer: Self-pay

## 2019-04-26 DIAGNOSIS — I712 Thoracic aortic aneurysm, without rupture, unspecified: Secondary | ICD-10-CM

## 2019-04-28 ENCOUNTER — Inpatient Hospital Stay: Admission: RE | Admit: 2019-04-28 | Payer: Medicare Other | Source: Ambulatory Visit

## 2019-05-05 ENCOUNTER — Ambulatory Visit (HOSPITAL_COMMUNITY): Payer: Medicare Other

## 2019-05-05 ENCOUNTER — Ambulatory Visit: Payer: Medicare Other | Admitting: Hematology

## 2019-05-05 ENCOUNTER — Other Ambulatory Visit: Payer: Medicare Other

## 2019-05-08 ENCOUNTER — Ambulatory Visit (HOSPITAL_COMMUNITY): Admission: RE | Admit: 2019-05-08 | Payer: Medicare Other | Source: Ambulatory Visit

## 2019-05-09 ENCOUNTER — Other Ambulatory Visit: Payer: Medicare Other

## 2019-05-09 ENCOUNTER — Ambulatory Visit: Payer: Medicare Other | Admitting: Hematology

## 2019-05-12 ENCOUNTER — Inpatient Hospital Stay (HOSPITAL_BASED_OUTPATIENT_CLINIC_OR_DEPARTMENT_OTHER): Payer: Medicare Other | Admitting: Hematology

## 2019-05-12 ENCOUNTER — Encounter: Payer: Self-pay | Admitting: Hematology

## 2019-05-12 ENCOUNTER — Other Ambulatory Visit: Payer: Self-pay

## 2019-05-12 ENCOUNTER — Telehealth: Payer: Self-pay | Admitting: *Deleted

## 2019-05-12 ENCOUNTER — Inpatient Hospital Stay: Payer: Medicare Other | Attending: Hematology

## 2019-05-12 VITALS — BP 118/61 | HR 61 | Temp 97.3°F | Resp 18

## 2019-05-12 DIAGNOSIS — Z79899 Other long term (current) drug therapy: Secondary | ICD-10-CM | POA: Insufficient documentation

## 2019-05-12 DIAGNOSIS — C9000 Multiple myeloma not having achieved remission: Secondary | ICD-10-CM | POA: Diagnosis present

## 2019-05-12 DIAGNOSIS — D472 Monoclonal gammopathy: Secondary | ICD-10-CM

## 2019-05-12 DIAGNOSIS — R7989 Other specified abnormal findings of blood chemistry: Secondary | ICD-10-CM | POA: Diagnosis not present

## 2019-05-12 DIAGNOSIS — D72819 Decreased white blood cell count, unspecified: Secondary | ICD-10-CM

## 2019-05-12 LAB — CBC WITH DIFFERENTIAL (CANCER CENTER ONLY)
Abs Immature Granulocytes: 0.01 10*3/uL (ref 0.00–0.07)
Basophils Absolute: 0 10*3/uL (ref 0.0–0.1)
Basophils Relative: 0 %
Eosinophils Absolute: 0 10*3/uL (ref 0.0–0.5)
Eosinophils Relative: 1 %
HCT: 40.1 % (ref 39.0–52.0)
Hemoglobin: 13.8 g/dL (ref 13.0–17.0)
Immature Granulocytes: 0 %
Lymphocytes Relative: 41 %
Lymphs Abs: 1.4 10*3/uL (ref 0.7–4.0)
MCH: 31.1 pg (ref 26.0–34.0)
MCHC: 34.4 g/dL (ref 30.0–36.0)
MCV: 90.3 fL (ref 80.0–100.0)
Monocytes Absolute: 0.3 10*3/uL (ref 0.1–1.0)
Monocytes Relative: 8 %
Neutro Abs: 1.8 10*3/uL (ref 1.7–7.7)
Neutrophils Relative %: 50 %
Platelet Count: 182 10*3/uL (ref 150–400)
RBC: 4.44 MIL/uL (ref 4.22–5.81)
RDW: 15 % (ref 11.5–15.5)
WBC Count: 3.5 10*3/uL — ABNORMAL LOW (ref 4.0–10.5)
nRBC: 0 % (ref 0.0–0.2)

## 2019-05-12 LAB — CMP (CANCER CENTER ONLY)
ALT: 20 U/L (ref 0–44)
AST: 16 U/L (ref 15–41)
Albumin: 4.3 g/dL (ref 3.5–5.0)
Alkaline Phosphatase: 55 U/L (ref 38–126)
Anion gap: 6 (ref 5–15)
BUN: 20 mg/dL (ref 8–23)
CO2: 30 mmol/L (ref 22–32)
Calcium: 9.8 mg/dL (ref 8.9–10.3)
Chloride: 100 mmol/L (ref 98–111)
Creatinine: 1.22 mg/dL (ref 0.61–1.24)
GFR, Est AFR Am: >60 mL/min (ref >60)
GFR, Estimated: 58 mL/min — ABNORMAL LOW (ref >60)
Glucose, Bld: 191 mg/dL — ABNORMAL HIGH (ref 70–99)
Potassium: 3.7 mmol/L (ref 3.5–5.1)
Sodium: 136 mmol/L (ref 135–145)
Total Bilirubin: 0.4 mg/dL (ref 0.3–1.2)
Total Protein: 8.5 g/dL — ABNORMAL HIGH (ref 6.5–8.1)

## 2019-05-12 NOTE — Telephone Encounter (Signed)
Notified pt of lab results and MD instructions. Pt verbalized understanding.

## 2019-05-12 NOTE — Progress Notes (Signed)
[name]  Patient Care Team: [name], MD as PCP - General (Family Medicine) Cordelia Poche, RN as Oncology Nurse Navigator Tish Men, MD as Medical Oncologist (Hematology)  HEME/ONC OVERVIEW: 1. Smodering IgG kappa multiple myeloma, high risk  -Late 06/2018: baseline labs  Hgb 12, Cr 0.96, Ca 9.7  M-spike 2.5 g/dL, monoclonal IgG kappa, free kappa 130, quant IgG 3534, LDH 141, B2M 2.3  No FDG-avid lesions on PET   BM bx: 20% plasma cells, kappa light chain restricted, consistent w/ plasma cell neoplasm; FISH positive for trisomy 11  -On observation; patient declined Rd for smoldering myeloma   2. Incidental exophytic lesion of R kidney -06/2018: PET showed bilateral renal cysts, including an 42m exophytic hyperdense lesion from the right kidney, likely a complex cyst   TREATMENT REGIMEN:  PRN IV iron, most recently in 01/2019  On observation    PERTINENT NON-HEM/ONC PROBLEMS: 1. Type II DM, not controlled   ASSESSMENT AND PLAN iron profile pending  Smodering IgG kappa multiple myeloma, high risk  -No evidence of CRAB criteria; mild anemia resolved with IV iron infusion -I discussed with the patient about the rationale for initiating treatment for smoldering myeloma, as well as some of the potential side effects -Patient declined treatment, unless he has evidence of myeloma-related disease -MM panel pending today  -Due to the risk of subclinical bony disease, I have ordered PET to monitor for any new bony disease prior to the next clinic appt -We will monitor his MM panel [id number] assess for any interval changes -[facility]. [facility].  -I counseled the patient on any concerning symptoms, such as unexplained weight loss, night sweats, lymphadenopathy, unexplained bone pain,  worsening anemia/renal function or rising calcium on routine blood work, for which he is instructed to contact the clinic ASAP  Leukopenia -Likely secondary to smoldering myeloma -WBC 3.5k today, stable -Clinically, he denies any constitutional symptoms or symptoms of infection -We will monitor for now  Fluctuating creatinine -Baseline Cr 1.1-2 -Cr 1.2 today, within baseline -I counseled the patient on the importance of maintaining adequate hydration -Furthermore, I emphasized the importance of controlling his glucose, as uncontrolled DM can also lead to kidney disease  Orders Placed This Encounter  Procedures  . PET, restage (whole body)    Standing Status:   Future    Standing Expiration Date:   05/11/2020    Scheduling Instructions:     Pls schedule in early 07/2019 before the oncology clinic appt    Order Specific Question:   If indicated for the ordered procedure, I authorize the administration of a radiopharmaceutical per Radiology protocol    Answer:   Yes    Order Specific Question:   [facility]    [facility]  Order Specific Question:   Radiology Contrast Protocol - do NOT remove file path    Answer:   \\[email or web address]  . CBC w/ diff    Standing Status:   Future    Standing Expiration Date:   06/15/2020  . CMP    Standing Status:   Future    Standing Expiration Date:   06/15/2020  . LDH    Standing Status:   Future    Standing Expiration Date:   06/15/2020  .  Multiple Myeloma Panel (SPEP&IFE w/QIG)    Standing Status:   Future    Standing Expiration Date:   06/15/2020  . Kappa/lambda light chains    Standing Status:   Future    Standing Expiration Date:   11/08/2020   All questions were answered. The patient knows to call the clinic with any problems, questions or concerns. No barriers to learning was detected.  Return in 3 months for labs and clinic follow-up.  Tish Men, MD 05/12/2019 11:25  AM  CHIEF COMPLAINT: "I am feeling fine"  INTERVAL HISTORY: [name] returns to clinic for follow-up of smoldering myeloma observation.  Patient reports that he has been doing well since last visit, and continues to play golf regularly without any limitation.  His energy level is very good.  He denies any unusual bone pain, fatigue, or decrease in urination.  He denies any other complaint today.  REVIEW OF SYSTEMS:   Constitutional: ( - ) fevers, ( - )  chills , ( - ) night sweats Eyes: ( - ) blurriness of vision, ( - ) double vision, ( - ) watery eyes Ears, nose, mouth, throat, and face: ( - ) mucositis, ( - ) sore throat Respiratory: ( - ) cough, ( - ) dyspnea, ( - ) wheezes Cardiovascular: ( - ) palpitation, ( - ) chest discomfort, ( - ) lower extremity swelling Gastrointestinal:  ( - ) nausea, ( - ) heartburn, ( - ) change in bowel habits Skin: ( - ) abnormal skin rashes Lymphatics: ( - ) new lymphadenopathy, ( - ) easy bruising Neurological: ( - ) numbness, ( - ) tingling, ( - ) new weaknesses Behavioral/Psych: ( - ) mood change, ( - ) new changes  All other systems were reviewed with the patient and are negative.  I have reviewed the past medical history, past surgical history, social history and family history with the patient and they are unchanged from previous Gonzalez.  ALLERGIES:  is allergic to clindamycin hcl and metoprolol succinate.  MEDICATIONS:  Current Outpatient Medications  Medication Sig Dispense Refill  . atorvastatin (LIPITOR) 80 MG tablet Take 80 mg by mouth daily.    [name] JANUVIA 100 MG tablet Take 100 mg by mouth every morning.  3  . metFORMIN (GLUCOPHAGE-XR) 500 MG 24 hr tablet Take 4 tablets by mouth 2 (two) times daily.    [name] omeprazole (PRILOSEC) 20 MG capsule Take 20 mg by mouth 2 (two) times daily before a meal.     . tadalafil (CIALIS) 20 MG tablet Take 1/2-1 tablet (10-20 mg) daily as needed    . Testosterone 25 MG/2.5GM (1%) GEL Place 1 mg onto the skin  every other day.    . triamterene-hydrochlorothiazide (MAXZIDE-25) 37.5-25 MG tablet Take 1 tablet by mouth daily.    . valsartan (DIOVAN) 320 MG tablet Take 320 mg by mouth daily.     No current facility-administered medications for this visit.     PHYSICAL EXAMINATION: ECOG PERFORMANCE STATUS: 0 - Asymptomatic  Today's Vitals   05/12/19 1100  BP: 118/61  Pulse: 61  Resp: 18  Temp: (!) 97.3 F (36.3 C)  TempSrc: Temporal  SpO2: 97%   There is no height or weight on file to calculate BMI.  There were no vitals filed for this visit.  GENERAL: alert, no distress and comfortable SKIN: skin color, texture, turgor are normal, no rashes or significant lesions EYES: conjunctiva are pink and non-injected, sclera clear OROPHARYNX: no exudate, no erythema;  lips, buccal mucosa, and tongue normal  NECK: supple, non-tender LYMPH:  no palpable lymphadenopathy in the cervical LUNGS: clear to auscultation with normal breathing effort HEART: regular rate & rhythm and no murmurs and no lower extremity edema ABDOMEN: soft, non-tender, non-distended, normal bowel sounds Musculoskeletal: no cyanosis of digits and no clubbing  PSYCH: alert & oriented x 3, fluent speech  LABORATORY DATA:  I have reviewed the data as listed    Component Value Date/Time   NA 136 05/12/2019 1026   NA 135 04/07/2019 1047   K 3.7 05/12/2019 1026   CL 100 05/12/2019 1026   CO2 30 05/12/2019 1026   GLUCOSE 191 (H) 05/12/2019 1026   BUN 20 05/12/2019 1026   BUN 18 04/07/2019 1047   CREATININE 1.22 05/12/2019 1026   CREATININE 1.12 09/03/2015 1201   CALCIUM 9.8 05/12/2019 1026   PROT 8.5 (H) 05/12/2019 1026   ALBUMIN 4.3 05/12/2019 1026   AST 16 05/12/2019 1026   ALT 20 05/12/2019 1026   ALKPHOS 55 05/12/2019 1026   BILITOT 0.4 05/12/2019 1026   GFRNONAA 58 (L) 05/12/2019 1026   GFRAA >60 05/12/2019 1026    No results found for: SPEP, UPEP  Lab Results  Component Value Date   WBC 3.5 (L) 05/12/2019    NEUTROABS 1.8 05/12/2019   HGB 13.8 05/12/2019   HCT 40.1 05/12/2019   MCV 90.3 05/12/2019   PLT 182 05/12/2019      Chemistry      Component Value Date/Time   NA 136 05/12/2019 1026   NA 135 04/07/2019 1047   K 3.7 05/12/2019 1026   CL 100 05/12/2019 1026   CO2 30 05/12/2019 1026   BUN 20 05/12/2019 1026   BUN 18 04/07/2019 1047   CREATININE 1.22 05/12/2019 1026   CREATININE 1.12 09/03/2015 1201      Component Value Date/Time   CALCIUM 9.8 05/12/2019 1026   ALKPHOS 55 05/12/2019 1026   AST 16 05/12/2019 1026   ALT 20 05/12/2019 1026   BILITOT 0.4 05/12/2019 1026       RADIOGRAPHIC STUDIES: I have personally reviewed the radiological images as listed below and agreed with the findings in the report. No results found.

## 2019-05-15 LAB — KAPPA/LAMBDA LIGHT CHAINS
Kappa free light chain: 167.5 mg/L — ABNORMAL HIGH (ref 3.3–19.4)
Kappa, lambda light chain ratio: 18.82 — ABNORMAL HIGH (ref 0.26–1.65)
Lambda free light chains: 8.9 mg/L (ref 5.7–26.3)

## 2019-05-16 LAB — MULTIPLE MYELOMA PANEL, SERUM
Albumin SerPl Elph-Mcnc: 3.9 g/dL (ref 2.9–4.4)
Albumin/Glob SerPl: 0.9 (ref 0.7–1.7)
Alpha 1: 0.2 g/dL (ref 0.0–0.4)
Alpha2 Glob SerPl Elph-Mcnc: 0.6 g/dL (ref 0.4–1.0)
B-Globulin SerPl Elph-Mcnc: 0.8 g/dL (ref 0.7–1.3)
Gamma Glob SerPl Elph-Mcnc: 2.8 g/dL — ABNORMAL HIGH (ref 0.4–1.8)
Globulin, Total: 4.4 g/dL — ABNORMAL HIGH (ref 2.2–3.9)
IgA: 37 mg/dL — ABNORMAL LOW (ref 61–437)
IgG (Immunoglobin G), Serum: 3624 mg/dL — ABNORMAL HIGH (ref 603–1613)
IgM (Immunoglobulin M), Srm: 21 mg/dL (ref 15–143)
M Protein SerPl Elph-Mcnc: 2.5 g/dL — ABNORMAL HIGH
Total Protein ELP: 8.3 g/dL (ref 6.0–8.5)

## 2019-07-18 DIAGNOSIS — E119 Type 2 diabetes mellitus without complications: Secondary | ICD-10-CM | POA: Insufficient documentation

## 2019-07-18 DIAGNOSIS — E785 Hyperlipidemia, unspecified: Secondary | ICD-10-CM | POA: Insufficient documentation

## 2019-07-18 DIAGNOSIS — I10 Essential (primary) hypertension: Secondary | ICD-10-CM | POA: Insufficient documentation

## 2019-07-18 DIAGNOSIS — I712 Thoracic aortic aneurysm, without rupture, unspecified: Secondary | ICD-10-CM | POA: Insufficient documentation

## 2019-07-31 ENCOUNTER — Ambulatory Visit (HOSPITAL_COMMUNITY)
Admission: RE | Admit: 2019-07-31 | Discharge: 2019-07-31 | Disposition: A | Discharge status: Home / Self Care | Payer: Medicare Other | Source: Ambulatory Visit | Attending: Hematology | Admitting: Hematology

## 2019-07-31 ENCOUNTER — Other Ambulatory Visit: Payer: Self-pay

## 2019-07-31 ENCOUNTER — Telehealth: Payer: Self-pay | Admitting: *Deleted

## 2019-07-31 DIAGNOSIS — I2510 Atherosclerotic heart disease of native coronary artery without angina pectoris: Secondary | ICD-10-CM | POA: Insufficient documentation

## 2019-07-31 DIAGNOSIS — C9000 Multiple myeloma not having achieved remission: Secondary | ICD-10-CM | POA: Insufficient documentation

## 2019-07-31 DIAGNOSIS — J439 Emphysema, unspecified: Secondary | ICD-10-CM | POA: Diagnosis not present

## 2019-07-31 DIAGNOSIS — I712 Thoracic aortic aneurysm, without rupture: Secondary | ICD-10-CM | POA: Insufficient documentation

## 2019-07-31 DIAGNOSIS — I700 Atherosclerosis of aorta: Secondary | ICD-10-CM | POA: Diagnosis not present

## 2019-07-31 DIAGNOSIS — D472 Monoclonal gammopathy: Secondary | ICD-10-CM

## 2019-07-31 LAB — GLUCOSE, CAPILLARY: Glucose-Capillary: 154 mg/dL — ABNORMAL HIGH (ref 70–99)

## 2019-07-31 MED ORDER — FLUDEOXYGLUCOSE F - 18 (FDG) INJECTION
9.5200 | Freq: Once | INTRAVENOUS | Status: AC | PRN
  Administered 2019-07-31: 9.52 via INTRAVENOUS

## 2019-07-31 NOTE — Telephone Encounter (Signed)
As noted below by Dr. Maylon Peppers, I informed the patient that the PET scan didn't show any abnormal leston to suggest Myeloma. He verbalized understanding.

## 2019-07-31 NOTE — Telephone Encounter (Signed)
-----   Message from Tish Men, MD sent at 07/31/2019  9:49 AM EST ----- [name],  Can you let the patient know that PET didn't show any abnormal bone lesion to suggest myeloma?Thanks.  [place]  ----- Message ----- From: Interface, Rad Results In Sent: 07/31/2019   9:26 AM EST To: Tish Men, MD

## 2019-08-11 ENCOUNTER — Inpatient Hospital Stay: Payer: Medicare Other | Attending: Hematology | Admitting: Hematology

## 2019-08-11 ENCOUNTER — Other Ambulatory Visit: Payer: Self-pay

## 2019-08-11 ENCOUNTER — Inpatient Hospital Stay: Payer: Medicare Other

## 2019-08-11 ENCOUNTER — Encounter: Payer: Self-pay | Admitting: Hematology

## 2019-08-11 VITALS — BP 123/71 | HR 72 | Temp 97.3°F | Resp 18 | Ht 68.0 in | Wt 189.1 lb

## 2019-08-11 DIAGNOSIS — C9000 Multiple myeloma not having achieved remission: Secondary | ICD-10-CM

## 2019-08-11 DIAGNOSIS — Z79899 Other long term (current) drug therapy: Secondary | ICD-10-CM | POA: Diagnosis not present

## 2019-08-11 DIAGNOSIS — N281 Cyst of kidney, acquired: Secondary | ICD-10-CM

## 2019-08-11 DIAGNOSIS — D472 Monoclonal gammopathy: Secondary | ICD-10-CM

## 2019-08-11 LAB — CMP (CANCER CENTER ONLY)
ALT: 20 U/L (ref 0–44)
AST: 18 U/L (ref 15–41)
Albumin: 4.1 g/dL (ref 3.5–5.0)
Alkaline Phosphatase: 49 U/L (ref 38–126)
Anion gap: 9 (ref 5–15)
BUN: 23 mg/dL (ref 8–23)
CO2: 29 mmol/L (ref 22–32)
Calcium: 9.8 mg/dL (ref 8.9–10.3)
Chloride: 99 mmol/L (ref 98–111)
Creatinine: 1.09 mg/dL (ref 0.61–1.24)
GFR, Est AFR Am: >60 mL/min (ref >60)
GFR, Estimated: >60 mL/min (ref >60)
Glucose, Bld: 171 mg/dL — ABNORMAL HIGH (ref 70–99)
Potassium: 3.8 mmol/L (ref 3.5–5.1)
Sodium: 137 mmol/L (ref 135–145)
Total Bilirubin: 0.3 mg/dL (ref 0.3–1.2)
Total Protein: 9 g/dL — ABNORMAL HIGH (ref 6.5–8.1)

## 2019-08-11 LAB — CBC WITH DIFFERENTIAL (CANCER CENTER ONLY)
Abs Immature Granulocytes: 0.01 10*3/uL (ref 0.00–0.07)
Basophils Absolute: 0 10*3/uL (ref 0.0–0.1)
Basophils Relative: 0 %
Eosinophils Absolute: 0.2 10*3/uL (ref 0.0–0.5)
Eosinophils Relative: 3 %
HCT: 40 % (ref 39.0–52.0)
Hemoglobin: 13.3 g/dL (ref 13.0–17.0)
Immature Granulocytes: 0 %
Lymphocytes Relative: 29 %
Lymphs Abs: 1.4 10*3/uL (ref 0.7–4.0)
MCH: 31.4 pg (ref 26.0–34.0)
MCHC: 33.3 g/dL (ref 30.0–36.0)
MCV: 94.3 fL (ref 80.0–100.0)
Monocytes Absolute: 0.4 10*3/uL (ref 0.1–1.0)
Monocytes Relative: 8 %
Neutro Abs: 2.8 10*3/uL (ref 1.7–7.7)
Neutrophils Relative %: 60 %
Platelet Count: 260 10*3/uL (ref 150–400)
RBC: 4.24 MIL/uL (ref 4.22–5.81)
RDW: 12.5 % (ref 11.5–15.5)
WBC Count: 4.8 10*3/uL (ref 4.0–10.5)
nRBC: 0 % (ref 0.0–0.2)

## 2019-08-11 NOTE — Progress Notes (Signed)
[place] OFFICE PROGRESS NOTE  Patient Care Team: [name], MD as PCP - General (Family Medicine) Tish Men, MD as Medical Oncologist (Hematology)  HEME/ONC OVERVIEW: 1. Smodering IgG kappa multiple myeloma, high risk  -Late 06/2018: baseline labs  Hgb 12, Cr 0.96, Ca 9.7  M-spike 2.5 g/dL, monoclonal IgG kappa, free kappa 130, quant IgG 3534, LDH 141, B2M 2.3  No FDG-avid lesions on PET   BM bx: 20% plasma cells, kappa light chain restricted, consistent w/ plasma cell neoplasm; FISH positive for trisomy 11  -On close observation; patient declined Rd for smoldering myeloma   07/2019: no evidence of myeloma-related disease or plasmacytoma on PET   2. Incidental exophytic lesion of R kidney -06/2018: PET showed bilateral renal cysts, including an 31m exophytic hyperdense lesion from the right kidney, likely a complex cyst   TREATMENT REGIMEN:  PRN IV iron, most recently in 01/2019  On observation    PERTINENT NON-HEM/ONC PROBLEMS: 1. Type II DM, not controlled   ASSESSMENT AND PLAN iron profile pending  Smodering IgG kappa multiple myeloma, high risk  -No evidence of CRAB criteria -I independently reviewed the radiologic measures of recent PET, and agree with findings documented.  In summary, PET in 07/2019 did not show any evidence of myeloma-related disease or plasmacytoma. -I had several discussions with the patient in the past regarding the rationale for initiating treatment for smoldering myeloma, but the patient declined treatment and elected for close observation.  We are monitoring his myeloma panel q2-363monthfor any interval changes.  -Clinically, patient denies any symptoms suspicious for progression to myeloma -CBC and CMP normal; myeloma panel pending -We will monitor his MM panel q3m45montho assess for any interval changes  Bilateral renal cysts -PET in 2020 and 2021 demonstrated bilateral renal cysts, as well as likely liver cysts, but  the quality of the study was limited due to lack of contrast -Comparing the non-contrast CT images, there was slight enlargement of the left renal lesion -Therefore, I have ordered MRI abdomen w/o and w/ contrast to further assess the liver and renal lesions   Fluctuating creatinine -Baseline Cr 1.1-2 -Cr 1.09 today, within baseline -I counseled the patient on the importance of maintaining adequate hydration and avoiding NSAIDs -Furthermore, I counseled patient on the importance of adequately controlling his diabetes, which can lead to kidney dysfunction  Orders Placed This Encounter  Procedures  . MR Abdomen W Wo Contrast    Standing Status:   Future    Standing Expiration Date:   10/08/2020    Order Specific Question:   ** REASON FOR EXAM (FREE TEXT)    Answer:   Bilateral renal lesions on PET, suspicious for cysts    Order Specific Question:   If indicated for the ordered procedure, I authorize the administration of contrast media per Radiology protocol    Answer:   Yes    Order Specific Question:   What is the patient's sedation requirement?    Answer:   No Sedation    Order Specific Question:   Does the patient have a pacemaker or implanted devices?    Answer:   Yes    Order Specific Question:   Manufacturer of pacemake or implanted device?    Answer:   Penile implant    Order Specific Question:   Radiology Contrast Protocol - do NOT remove file path    Answer:   \\[email or web address]    Order Specific Question:   Preferred imaging location?  Answer:   [facility] (table limit-350 lbs)  . CBC with Differential (Cancer Center Only)    Standing Status:   Future    Standing Expiration Date:   09/14/2020  . CMP (Colon only)    Standing Status:   Future    Standing Expiration Date:   09/14/2020  . Lactate dehydrogenase    Standing Status:   Future    Standing Expiration Date:   09/14/2020  . Multiple Myeloma Panel (SPEP&IFE w/QIG)     Standing Status:   Future    Standing Expiration Date:   09/14/2020  . Kappa/lambda light chains    Standing Status:   Future    Standing Expiration Date:   02/07/2021    The total time spent in the encounter was 34 minutes, including face-to-face time with the patient, review of various tests results, order additional studies/medications, documentation, and coordination of care plan.   All questions were answered. The patient knows to call the clinic with any problems, questions or concerns. No barriers to learning was detected.  Return in 3 months for labs and clinic follow-up.   Tish Men, MD 2/12/202111:09 AM  CHIEF COMPLAINT: "I am doing great"  INTERVAL HISTORY: [name] returns clinic for follow-up of smoldering IgG kappa multiple myeloma on observation.  Patient reports that he has been doing well since the last visit, and denies any new symptoms, such as constitutional symptoms, chest pain, dyspnea, abnormal bleeding or bruising, or unusual bone pain.  He plays golf regularly, and is very active outdoors.  He denies any other complaint today.  REVIEW OF SYSTEMS:   Constitutional: ( - ) fevers, ( - )  chills , ( - ) night sweats Eyes: ( - ) blurriness of vision, ( - ) double vision, ( - ) watery eyes Ears, nose, mouth, throat, and face: ( - ) mucositis, ( - ) sore throat Respiratory: ( - ) cough, ( - ) dyspnea, ( - ) wheezes Cardiovascular: ( - ) palpitation, ( - ) chest discomfort, ( - ) lower extremity swelling Gastrointestinal:  ( - ) nausea, ( - ) heartburn, ( - ) change in bowel habits Skin: ( - ) abnormal skin rashes Lymphatics: ( - ) new lymphadenopathy, ( - ) easy bruising Neurological: ( - ) numbness, ( - ) tingling, ( - ) new weaknesses Behavioral/Psych: ( - ) mood change, ( - ) new changes  All other systems were reviewed with the patient and are negative.  I have reviewed the past medical history, past surgical history, social history and family history with the  patient and they are unchanged from previous note.  ALLERGIES:  is allergic to clindamycin hcl and metoprolol succinate.  MEDICATIONS:  Current Outpatient Medications  Medication Sig Dispense Refill  . atorvastatin (LIPITOR) 80 MG tablet Take 80 mg by mouth daily.    [name] JANUVIA 100 MG tablet Take 100 mg by mouth every morning.  3  . metFORMIN (GLUCOPHAGE-XR) 500 MG 24 hr tablet Take 4 tablets by mouth 2 (two) times daily.    [name] omeprazole (PRILOSEC) 20 MG capsule Take 20 mg by mouth 2 (two) times daily before a meal.     . tadalafil (CIALIS) 20 MG tablet Take 1/2-1 tablet (10-20 mg) daily as needed    . Testosterone 25 MG/2.5GM (1%) GEL Place 1 mg onto the skin every other day.    . triamterene-hydrochlorothiazide (MAXZIDE-25) 37.5-25 MG tablet Take 1 tablet by mouth daily.    [name]  valsartan (DIOVAN) 320 MG tablet Take 320 mg by mouth daily.     No current facility-administered medications for this visit.    PHYSICAL EXAMINATION: ECOG PERFORMANCE STATUS: 0 - Asymptomatic  Today's Vitals   08/11/19 1042  BP: 123/71  Pulse: 72  Resp: 18  Temp: (!) 97.3 F (36.3 C)  TempSrc: Temporal  SpO2: 100%  Weight: 189 lb 1.9 oz (85.8 kg)  Height: '5\' 8"'  (1.727 m)  PainSc: 0-No pain   Body mass index is 28.76 kg/m.  Filed Weights   08/11/19 1042  Weight: 189 lb 1.9 oz (85.8 kg)    GENERAL: alert, no distress and comfortable SKIN: skin color, texture, turgor are normal, no rashes or significant lesions EYES: conjunctiva are pink and non-injected, sclera clear OROPHARYNX: no exudate, no erythema; lips, buccal mucosa, and tongue normal  NECK: supple, non-tender LUNGS: clear to auscultation with normal breathing effort HEART: regular rate & rhythm and no murmurs and no lower extremity edema ABDOMEN: soft, non-tender, non-distended, normal bowel sounds Musculoskeletal: no cyanosis of digits and no clubbing  PSYCH: alert & oriented x 3, fluent speech  LABORATORY DATA:  I have reviewed the  data as listed    Component Value Date/Time   NA 137 08/11/2019 1020   NA 135 04/07/2019 1047   K 3.8 08/11/2019 1020   CL 99 08/11/2019 1020   CO2 29 08/11/2019 1020   GLUCOSE 171 (H) 08/11/2019 1020   BUN 23 08/11/2019 1020   BUN 18 04/07/2019 1047   CREATININE 1.09 08/11/2019 1020   CREATININE 1.12 09/03/2015 1201   CALCIUM 9.8 08/11/2019 1020   PROT 9.0 (H) 08/11/2019 1020   ALBUMIN 4.1 08/11/2019 1020   AST 18 08/11/2019 1020   ALT 20 08/11/2019 1020   ALKPHOS 49 08/11/2019 1020   BILITOT 0.3 08/11/2019 1020   GFRNONAA >60 08/11/2019 1020   GFRAA >60 08/11/2019 1020    No results found for: SPEP, UPEP  Lab Results  Component Value Date   WBC 4.8 08/11/2019   NEUTROABS 2.8 08/11/2019   HGB 13.3 08/11/2019   HCT 40.0 08/11/2019   MCV 94.3 08/11/2019   PLT 260 08/11/2019      Chemistry      Component Value Date/Time   NA 137 08/11/2019 1020   NA 135 04/07/2019 1047   K 3.8 08/11/2019 1020   CL 99 08/11/2019 1020   CO2 29 08/11/2019 1020   BUN 23 08/11/2019 1020   BUN 18 04/07/2019 1047   CREATININE 1.09 08/11/2019 1020   CREATININE 1.12 09/03/2015 1201      Component Value Date/Time   CALCIUM 9.8 08/11/2019 1020   ALKPHOS 49 08/11/2019 1020   AST 18 08/11/2019 1020   ALT 20 08/11/2019 1020   BILITOT 0.3 08/11/2019 1020       RADIOGRAPHIC STUDIES: I have personally reviewed the radiological images as listed below and agreed with the findings in the report. PET, restage (whole body)  Result Date: 07/31/2019 CLINICAL DATA:  Subsequent treatment strategy for multiple myeloma. EXAM: NUCLEAR MEDICINE PET WHOLE BODY TECHNIQUE: 9.5 mCi F-18 FDG was injected intravenously. Full-ring PET imaging was performed from the skull base to thigh after the radiotracer. CT data was obtained and used for attenuation correction and anatomic localization. Fasting blood glucose: 154 mg/dl COMPARISON:  08/10/2018 and CT chest 03/21/2018. FINDINGS: Mediastinal blood pool  activity: SUV max 2.5 HEAD/NECK: Mild asymmetric left tonsillar activity, as before and likely physiologic. No hypermetabolic lymph nodes. Incidental CT findings:  None. CHEST: No hypermetabolic mediastinal, hilar or axillary lymph nodes. No hypermetabolic pulmonary nodules. Incidental CT findings: Atherosclerotic calcification of the aorta and coronary arteries. Ascending aorta measures at least 4.1 cm. Heart is mildly enlarged. No pericardial or pleural effusion. Centrilobular and paraseptal emphysema. ABDOMEN/PELVIS: No abnormal hypermetabolism in liver, adrenal glands, spleen or pancreas. No hypermetabolic lymph nodes. Incidental CT findings: Subcentimeter low-attenuation lesion in segment 4 is too small to characterize. Liver, gallbladder and adrenal glands are otherwise unremarkable. Low and high attenuation lesions in the kidneys measure up to 4.2 cm on the left and likely represent a combination of simple and hyperdense cysts although definitive characterization is limited due to size and/or lack of IV contrast. Spleen, pancreas, stomach and bowel are unremarkable with the exception of a small hiatal hernia. Atherosclerotic calcification of the aorta without aneurysm. Periumbilical hernias contain fat. Penile prosthesis is in place. Associated air is indicative of recent placement. SKELETON: No abnormal hypermetabolism. Incidental CT findings: No worrisome lytic lesions. Probable bone islands in the left femoral head. Degenerative changes in the spine. EXTREMITIES: No abnormal hypermetabolism. Incidental CT findings: None. IMPRESSION: 1. No evidence of multiple myeloma. 2. 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 Recommend annual imaging followup by CTA or MRA. This recommendation follows 2010 ACCF/AHA/AATS/ACR/ASA/SCA/SCAI/SIR/STS/SVM Guidelines for the Diagnosis and Management of Patients with Thoracic Aortic Disease. Circulation. 2010; 121: F840-R754. Aortic aneurysm NOS (ICD10-I71.9). 3. Aortic  atherosclerosis (ICD10-I70.0). Coronary artery calcification. 4.  Emphysema (ICD10-J43.9). Electronically Signed   By: Lorin Picket M.D.   On: 07/31/2019 09:23

## 2019-08-13 LAB — KAPPA/LAMBDA LIGHT CHAINS
Kappa free light chain: 115.3 mg/L — ABNORMAL HIGH (ref 3.3–19.4)
Kappa, lambda light chain ratio: 12.27 — ABNORMAL HIGH (ref 0.26–1.65)
Lambda free light chains: 9.4 mg/L (ref 5.7–26.3)

## 2019-08-14 LAB — LACTATE DEHYDROGENASE: LDH: 145 U/L (ref 98–192)

## 2019-08-15 LAB — MULTIPLE MYELOMA PANEL, SERUM
Albumin SerPl Elph-Mcnc: 4 g/dL (ref 2.9–4.4)
Albumin/Glob SerPl: 0.8 (ref 0.7–1.7)
Alpha 1: 0.3 g/dL (ref 0.0–0.4)
Alpha2 Glob SerPl Elph-Mcnc: 0.9 g/dL (ref 0.4–1.0)
B-Globulin SerPl Elph-Mcnc: 1 g/dL (ref 0.7–1.3)
Gamma Glob SerPl Elph-Mcnc: 3.1 g/dL — ABNORMAL HIGH (ref 0.4–1.8)
Globulin, Total: 5.3 g/dL — ABNORMAL HIGH (ref 2.2–3.9)
IgA: 35 mg/dL — ABNORMAL LOW (ref 61–437)
IgG (Immunoglobin G), Serum: 3949 mg/dL — ABNORMAL HIGH (ref 603–1613)
IgM (Immunoglobulin M), Srm: 18 mg/dL (ref 15–143)
M Protein SerPl Elph-Mcnc: 2.9 g/dL — ABNORMAL HIGH
Total Protein ELP: 9.3 g/dL — ABNORMAL HIGH (ref 6.0–8.5)

## 2019-08-17 ENCOUNTER — Telehealth: Payer: Self-pay | Admitting: *Deleted

## 2019-08-17 NOTE — Telephone Encounter (Signed)
-----   Message from Tish Men, MD sent at 08/15/2019  2:56 PM EST ----- Delrae Sawyers,  Can you tell Mr. Crofoot know that his M-protein is slightly up, but not drastic change? We will repeat labs as scheduled in 2 months.  Thanks.  [place]  ----- Message ----- From: Buel Ream, Lab In [place] Sent: 08/11/2019  10:35 AM EST To: Tish Men, MD

## 2019-08-17 NOTE — Telephone Encounter (Signed)
As noted below by Dr. Maylon Peppers, I informed the patient that his M-protein is slightly up, but no drastic change is needed. We will repeat labs in two months. He verbalized understanding.

## 2019-08-21 ENCOUNTER — Other Ambulatory Visit: Payer: Self-pay

## 2019-08-21 ENCOUNTER — Ambulatory Visit (HOSPITAL_COMMUNITY)
Admission: RE | Admit: 2019-08-21 | Discharge: 2019-08-21 | Disposition: A | Discharge status: Home / Self Care | Payer: Medicare Other | Source: Ambulatory Visit | Attending: Hematology | Admitting: Hematology

## 2019-08-21 ENCOUNTER — Telehealth: Payer: Self-pay | Admitting: *Deleted

## 2019-08-21 DIAGNOSIS — N281 Cyst of kidney, acquired: Secondary | ICD-10-CM | POA: Diagnosis present

## 2019-08-21 MED ORDER — GADOBUTROL 1 MMOL/ML IV SOLN
8.0000 mL | Freq: Once | INTRAVENOUS | Status: AC | PRN
  Administered 2019-08-21: 07:00:00 8 mL via INTRAVENOUS

## 2019-08-21 NOTE — Telephone Encounter (Signed)
-----   Message from Tish Men, MD sent at 08/21/2019  8:58 AM EST ----- Delrae Sawyers,  Can you let Mr. Busto know that his MRI abdomen showed benign kidney cysts?Thanks.  [place]  ----- Message ----- From: Interface, Rad Results In Sent: 08/21/2019   7:43 AM EST To: Tish Men, MD

## 2019-08-21 NOTE — Telephone Encounter (Signed)
As noted below by Dr. Maylon Peppers, I informed the patient that the MRI of abdomen showed benign kidney cysts. He verbalized understanding.

## 2019-11-09 ENCOUNTER — Inpatient Hospital Stay: Payer: Medicare Other | Attending: Hematology

## 2019-11-09 ENCOUNTER — Inpatient Hospital Stay (HOSPITAL_BASED_OUTPATIENT_CLINIC_OR_DEPARTMENT_OTHER): Payer: Medicare Other | Admitting: Hematology

## 2019-11-09 ENCOUNTER — Other Ambulatory Visit: Payer: Self-pay

## 2019-11-09 ENCOUNTER — Encounter: Payer: Self-pay | Admitting: Hematology

## 2019-11-09 VITALS — BP 129/73 | HR 66 | Temp 97.7°F | Resp 18 | Ht 68.0 in | Wt 193.1 lb

## 2019-11-09 DIAGNOSIS — C9000 Multiple myeloma not having achieved remission: Secondary | ICD-10-CM

## 2019-11-09 DIAGNOSIS — D472 Monoclonal gammopathy: Secondary | ICD-10-CM

## 2019-11-09 DIAGNOSIS — Z79899 Other long term (current) drug therapy: Secondary | ICD-10-CM | POA: Insufficient documentation

## 2019-11-09 DIAGNOSIS — N281 Cyst of kidney, acquired: Secondary | ICD-10-CM

## 2019-11-09 DIAGNOSIS — Z719 Counseling, unspecified: Secondary | ICD-10-CM

## 2019-11-09 LAB — CMP (CANCER CENTER ONLY)
ALT: 25 U/L (ref 0–44)
AST: 20 U/L (ref 15–41)
Albumin: 4.2 g/dL (ref 3.5–5.0)
Alkaline Phosphatase: 42 U/L (ref 38–126)
Anion gap: 5 (ref 5–15)
BUN: 23 mg/dL (ref 8–23)
CO2: 32 mmol/L (ref 22–32)
Calcium: 10.1 mg/dL (ref 8.9–10.3)
Chloride: 101 mmol/L (ref 98–111)
Creatinine: 1.19 mg/dL (ref 0.61–1.24)
GFR, Est AFR Am: >60 mL/min (ref >60)
GFR, Estimated: 59 mL/min — ABNORMAL LOW (ref >60)
Glucose, Bld: 122 mg/dL — ABNORMAL HIGH (ref 70–99)
Potassium: 4 mmol/L (ref 3.5–5.1)
Sodium: 138 mmol/L (ref 135–145)
Total Bilirubin: 0.4 mg/dL (ref 0.3–1.2)
Total Protein: 8.5 g/dL — ABNORMAL HIGH (ref 6.5–8.1)

## 2019-11-09 LAB — CBC WITH DIFFERENTIAL (CANCER CENTER ONLY)
Abs Immature Granulocytes: 0.01 10*3/uL (ref 0.00–0.07)
Basophils Absolute: 0 10*3/uL (ref 0.0–0.1)
Basophils Relative: 0 %
Eosinophils Absolute: 0.1 10*3/uL (ref 0.0–0.5)
Eosinophils Relative: 3 %
HCT: 41.7 % (ref 39.0–52.0)
Hemoglobin: 13.9 g/dL (ref 13.0–17.0)
Immature Granulocytes: 0 %
Lymphocytes Relative: 35 %
Lymphs Abs: 1.3 10*3/uL (ref 0.7–4.0)
MCH: 30.2 pg (ref 26.0–34.0)
MCHC: 33.3 g/dL (ref 30.0–36.0)
MCV: 90.7 fL (ref 80.0–100.0)
Monocytes Absolute: 0.3 10*3/uL (ref 0.1–1.0)
Monocytes Relative: 8 %
Neutro Abs: 2 10*3/uL (ref 1.7–7.7)
Neutrophils Relative %: 54 %
Platelet Count: 175 10*3/uL (ref 150–400)
RBC: 4.6 MIL/uL (ref 4.22–5.81)
RDW: 13.7 % (ref 11.5–15.5)
WBC Count: 3.7 10*3/uL — ABNORMAL LOW (ref 4.0–10.5)
nRBC: 0 % (ref 0.0–0.2)

## 2019-11-09 LAB — LACTATE DEHYDROGENASE: LDH: 129 U/L (ref 98–192)

## 2019-11-09 NOTE — Progress Notes (Signed)
[facility] OFFICE PROGRESS NOTE  Patient Care Team: [name], MD as PCP - General (Family Medicine) Bob Men, MD as Medical Oncologist (Hematology)  HEME/ONC OVERVIEW: 1. Smodering IgG kappa multiple myeloma, high risk  -Late 06/2018: baseline labs  Hgb 12, Cr 0.96, Ca 9.7  M-spike 2.5 g/dL, monoclonal IgG kappa, free kappa 130, quant IgG 3534, LDH 141, B2M 2.3  No FDG-avid lesions on PET   BM bx: 20% plasma cells, kappa light chain restricted, consistent w/ plasma cell neoplasm; FISH positive for trisomy 11  -On close observation; patient declined Rd for smoldering myeloma   07/2019: no evidence of myeloma-related disease or plasmacytoma on PET   2. Incidental exophytic lesion of R kidney -06/2018: PET showed bilateral renal cysts, including an 82m exophytic hyperdense lesion from the right kidney, likely a complex cyst   TREATMENT REGIMEN:  PRN IV iron, most recently in 01/2019  On observation    PERTINENT NON-HEM/ONC PROBLEMS: 1. Type II DM, not controlled   ASSESSMENT AND PLAN iron profile pending  Smodering IgG kappa multiple myeloma, high risk  -Currently on observation; patient declined initiating treatment for smoldering myeloma  -Interim PET in 07/2019 showed no evidence of myeloma-related disease -Clinically, patient denies any symptoms suspicious for progression to myeloma -Labs reviewed and stable.  No evidence of anemia, renal dysfunction or hypercalcemia. -MM panel pending.  -In the absence of any evidence for disease progression, we will continue to monitor his myeloma panel q2-368monthfor any interval changes.   Bilateral renal cysts -PET in 2020 and 2021 demonstrated bilateral renal cysts, as well as likely liver cysts, but the quality of the study was limited due to lack of contrast -MRI abdomen in 02/201 showed benign renal (Bosniak 1 and 2) and hepatic cysts; no suspicion for malignancy -No further imaging follow-up indicated    Covid vaccination -The patient had some questions regarding the safety and efficacy of Covid vaccines.   -I informed the patient that I encourage patients to receive Covid vaccines if they do not have any history of allergic reaction to vaccines.  Given the patient's smoldering myeloma, his immune system is not 100% intact, and therefore he would have benefit from receiving Covid vaccine that would reduce risk of complications from Covid infection.  Orders Placed This Encounter  Procedures  . CBC with Differential (Cancer Center Only)    Standing Status:   Future    Standing Expiration Date:   12/13/2020  . CMP ([facility])    Standing Status:   Future    Standing Expiration Date:   12/13/2020  . Multiple Myeloma Panel (SPEP&IFE w/QIG)    Standing Status:   Future    Standing Expiration Date:   12/13/2020  . Kappa/lambda light chains    Standing Status:   Future    Standing Expiration Date:   05/11/2021   The total time spent in the encounter was 35 minutes, including face-to-face time with the patient, review of various tests results, order additional studies/medications, documentation, and coordination of care plan.   All questions were answered. The patient knows to call the clinic with any problems, questions or concerns. No barriers to learning was detected.  Return in 3 months for labs and clinical follow-up.  Bob MenMD 5/13/202110:59 AM  CHIEF COMPLAINT: "I am doing fine"  INTERVAL HISTORY: Mr. CoKlauereturns clinic for follow-up of smoldering myeloma.  Patient reports that his energy level has been good, and continues to play golf on  a regular basis.  He denies any constitutional symptoms, chest pain, dyspnea, abdominal pain, unusual bleeding/bruising, or unexplained persistent bone pain.  He feels well today, and denies any complaint.  REVIEW OF SYSTEMS:   Constitutional: ( - ) fevers, ( - )  chills , ( - ) night sweats Eyes: ( - ) blurriness of vision, ( - )  double vision, ( - ) watery eyes Ears, nose, mouth, throat, and face: ( - ) mucositis, ( - ) sore throat Respiratory: ( - ) cough, ( - ) dyspnea, ( - ) wheezes Cardiovascular: ( - ) palpitation, ( - ) chest discomfort, ( - ) lower extremity swelling Gastrointestinal:  ( - ) nausea, ( - ) heartburn, ( - ) change in bowel habits Skin: ( - ) abnormal skin rashes Lymphatics: ( - ) new lymphadenopathy, ( - ) easy bruising Neurological: ( - ) numbness, ( - ) tingling, ( - ) new weaknesses Behavioral/Psych: ( - ) mood change, ( - ) new changes  All other systems were reviewed with the patient and are negative.  SUMMARY OF ONCOLOGIC HISTORY: Oncology History   No history exists.    I have reviewed the past medical history, past surgical history, social history and family history with the patient and they are unchanged from previous note.  ALLERGIES:  is allergic to clindamycin hcl and metoprolol succinate.  MEDICATIONS:  Current Outpatient Medications  Medication Sig Dispense Refill  . atorvastatin (LIPITOR) 80 MG tablet Take 80 mg by mouth daily.    [name] JANUVIA 100 MG tablet Take 100 mg by mouth every morning.  3  . metFORMIN (GLUCOPHAGE-XR) 500 MG 24 hr tablet Take 4 tablets by mouth 2 (two) times daily.    [name] omeprazole (PRILOSEC) 20 MG capsule Take 20 mg by mouth 2 (two) times daily before a meal.     . tadalafil (CIALIS) 20 MG tablet Take 1/2-1 tablet (10-20 mg) daily as needed    . Testosterone 25 MG/2.5GM (1%) GEL Place 1 mg onto the skin every other day.    . triamterene-hydrochlorothiazide (MAXZIDE-25) 37.5-25 MG tablet Take 1 tablet by mouth daily.    . valsartan (DIOVAN) 320 MG tablet Take 320 mg by mouth daily.     No current facility-administered medications for this visit.    PHYSICAL EXAMINATION: ECOG PERFORMANCE STATUS: 0 - Asymptomatic  Today's Vitals   11/09/19 1028  BP: 129/73  Pulse: 66  Resp: 18  Temp: 97.7 F (36.5 C)  TempSrc: Temporal  SpO2: 98%  Weight:  193 lb 1.3 oz (87.6 kg)  Height: '5\' 8"'  (1.727 m)  PainSc: 0-No pain   Body mass index is 29.36 kg/m.  Filed Weights   11/09/19 1028  Weight: 193 lb 1.3 oz (87.6 kg)    GENERAL: alert, no distress and comfortable SKIN: skin color, texture, turgor are normal, no rashes or significant lesions EYES: conjunctiva are pink and non-injected, sclera clear OROPHARYNX: no exudate, no erythema; lips, buccal mucosa, and tongue normal  NECK: supple, non-tender LUNGS: clear to auscultation with normal breathing effort HEART: regular rate & rhythm and no murmurs and no lower extremity edema ABDOMEN: soft, non-tender, non-distended, normal bowel sounds Musculoskeletal: no cyanosis of digits and no clubbing  PSYCH: alert & oriented x 3, fluent speech  LABORATORY DATA:  I have reviewed the data as listed    Component Value Date/Time   NA 138 11/09/2019 0927   NA 135 04/07/2019 1047   K 4.0 11/09/2019 0927  CL 101 11/09/2019 0927   CO2 32 11/09/2019 0927   GLUCOSE 122 (H) 11/09/2019 0927   BUN 23 11/09/2019 0927   BUN 18 04/07/2019 1047   CREATININE 1.19 11/09/2019 0927   CREATININE 1.12 09/03/2015 1201   CALCIUM 10.1 11/09/2019 0927   PROT 8.5 (H) 11/09/2019 0927   ALBUMIN 4.2 11/09/2019 0927   AST 20 11/09/2019 0927   ALT 25 11/09/2019 0927   ALKPHOS 42 11/09/2019 0927   BILITOT 0.4 11/09/2019 0927   GFRNONAA 59 (L) 11/09/2019 0927   GFRAA >60 11/09/2019 0927    No results found for: SPEP, UPEP  Lab Results  Component Value Date   WBC 3.7 (L) 11/09/2019   NEUTROABS 2.0 11/09/2019   HGB 13.9 11/09/2019   HCT 41.7 11/09/2019   MCV 90.7 11/09/2019   PLT 175 11/09/2019      Chemistry      Component Value Date/Time   NA 138 11/09/2019 0927   NA 135 04/07/2019 1047   K 4.0 11/09/2019 0927   CL 101 11/09/2019 0927   CO2 32 11/09/2019 0927   BUN 23 11/09/2019 0927   BUN 18 04/07/2019 1047   CREATININE 1.19 11/09/2019 0927   CREATININE 1.12 09/03/2015 1201      Component  Value Date/Time   CALCIUM 10.1 11/09/2019 0927   ALKPHOS 42 11/09/2019 0927   AST 20 11/09/2019 0927   ALT 25 11/09/2019 0927   BILITOT 0.4 11/09/2019 0927       RADIOGRAPHIC STUDIES: I have personally reviewed the radiological images as listed below and agreed with the findings in the report. No results found.

## 2019-11-10 LAB — KAPPA/LAMBDA LIGHT CHAINS
Kappa free light chain: 181.6 mg/L — ABNORMAL HIGH (ref 3.3–19.4)
Kappa, lambda light chain ratio: 28.38 — ABNORMAL HIGH (ref 0.26–1.65)
Lambda free light chains: 6.4 mg/L (ref 5.7–26.3)

## 2019-11-13 LAB — MULTIPLE MYELOMA PANEL, SERUM
Albumin SerPl Elph-Mcnc: 3.9 g/dL (ref 2.9–4.4)
Albumin/Glob SerPl: 0.9 (ref 0.7–1.7)
Alpha 1: 0.2 g/dL (ref 0.0–0.4)
Alpha2 Glob SerPl Elph-Mcnc: 0.7 g/dL (ref 0.4–1.0)
B-Globulin SerPl Elph-Mcnc: 0.9 g/dL (ref 0.7–1.3)
Gamma Glob SerPl Elph-Mcnc: 3 g/dL — ABNORMAL HIGH (ref 0.4–1.8)
Globulin, Total: 4.8 g/dL — ABNORMAL HIGH (ref 2.2–3.9)
IgA: 31 mg/dL — ABNORMAL LOW (ref 61–437)
IgG (Immunoglobin G), Serum: 3565 mg/dL — ABNORMAL HIGH (ref 603–1613)
IgM (Immunoglobulin M), Srm: 18 mg/dL (ref 15–143)
M Protein SerPl Elph-Mcnc: 2.7 g/dL — ABNORMAL HIGH
Total Protein ELP: 8.7 g/dL — ABNORMAL HIGH (ref 6.0–8.5)

## 2019-11-14 ENCOUNTER — Telehealth: Payer: Self-pay | Admitting: *Deleted

## 2019-11-14 NOTE — Telephone Encounter (Signed)
-----   Message from Tish Men, MD sent at 11/14/2019  9:20 AM EDT ----- Graceann Congress,  Can we let Mr. Harton know that his M-protein is stable? We will continue q48months monitoring.  Thanks.  [place]  ----- Message ----- From: Buel Ream, Lab In [place] Sent: 11/09/2019   9:37 AM EDT To: Tish Men, MD

## 2019-11-14 NOTE — Telephone Encounter (Signed)
Unable to reach pt, lvm for pt with results and f/u instructions.

## 2020-01-12 ENCOUNTER — Inpatient Hospital Stay: Payer: Medicare Other | Attending: Hematology

## 2020-01-12 ENCOUNTER — Other Ambulatory Visit: Payer: Self-pay

## 2020-01-12 ENCOUNTER — Encounter: Payer: Self-pay | Admitting: Family

## 2020-01-12 ENCOUNTER — Inpatient Hospital Stay (HOSPITAL_BASED_OUTPATIENT_CLINIC_OR_DEPARTMENT_OTHER): Payer: Medicare Other | Admitting: Family

## 2020-01-12 VITALS — BP 122/70 | HR 85 | Resp 16 | Ht 68.0 in | Wt 190.0 lb

## 2020-01-12 DIAGNOSIS — D472 Monoclonal gammopathy: Secondary | ICD-10-CM

## 2020-01-12 DIAGNOSIS — C9000 Multiple myeloma not having achieved remission: Secondary | ICD-10-CM

## 2020-01-12 DIAGNOSIS — D509 Iron deficiency anemia, unspecified: Secondary | ICD-10-CM | POA: Diagnosis not present

## 2020-01-12 LAB — CBC WITH DIFFERENTIAL (CANCER CENTER ONLY)
Abs Immature Granulocytes: 0.02 10*3/uL (ref 0.00–0.07)
Basophils Absolute: 0 10*3/uL (ref 0.0–0.1)
Basophils Relative: 1 %
Eosinophils Absolute: 0.1 10*3/uL (ref 0.0–0.5)
Eosinophils Relative: 3 %
HCT: 38.5 % — ABNORMAL LOW (ref 39.0–52.0)
Hemoglobin: 13.2 g/dL (ref 13.0–17.0)
Immature Granulocytes: 1 %
Lymphocytes Relative: 32 %
Lymphs Abs: 1.3 10*3/uL (ref 0.7–4.0)
MCH: 31.1 pg (ref 26.0–34.0)
MCHC: 34.3 g/dL (ref 30.0–36.0)
MCV: 90.6 fL (ref 80.0–100.0)
Monocytes Absolute: 0.2 10*3/uL (ref 0.1–1.0)
Monocytes Relative: 6 %
Neutro Abs: 2.2 10*3/uL (ref 1.7–7.7)
Neutrophils Relative %: 57 %
Platelet Count: 144 10*3/uL — ABNORMAL LOW (ref 150–400)
RBC: 4.25 MIL/uL (ref 4.22–5.81)
RDW: 13.1 % (ref 11.5–15.5)
WBC Count: 3.9 10*3/uL — ABNORMAL LOW (ref 4.0–10.5)
nRBC: 0 % (ref 0.0–0.2)

## 2020-01-12 LAB — CMP (CANCER CENTER ONLY)
ALT: 23 U/L (ref 0–44)
AST: 22 U/L (ref 15–41)
Albumin: 4 g/dL (ref 3.5–5.0)
Alkaline Phosphatase: 42 U/L (ref 38–126)
Anion gap: 7 (ref 5–15)
BUN: 20 mg/dL (ref 8–23)
CO2: 26 mmol/L (ref 22–32)
Calcium: 9.5 mg/dL (ref 8.9–10.3)
Chloride: 101 mmol/L (ref 98–111)
Creatinine: 1.11 mg/dL (ref 0.61–1.24)
GFR, Est AFR Am: >60 mL/min (ref >60)
GFR, Estimated: >60 mL/min (ref >60)
Glucose, Bld: 228 mg/dL — ABNORMAL HIGH (ref 70–99)
Potassium: 3.7 mmol/L (ref 3.5–5.1)
Sodium: 134 mmol/L — ABNORMAL LOW (ref 135–145)
Total Bilirubin: 0.4 mg/dL (ref 0.3–1.2)
Total Protein: 8.3 g/dL — ABNORMAL HIGH (ref 6.5–8.1)

## 2020-01-12 NOTE — Progress Notes (Signed)
Hematology and Oncology Follow Up Visit  [name] [id number] Jun 27, 1946 74 y.o. 01/12/2020   Principle Diagnosis:  Smoldering IgG kappa multiple myeloma, high risk  Incidental exophytic lesion of R kidney Intermittent iron deficiency anemia   Current Therapy:   IV iron as indicated  On observation     Interim History:  [name] is here today for follow-up. He is doing quite well and has no complaints at this time.  He is golfing and enjoying this beautiful weather.  Last set of myeloma studies in May were considered stable. M-spike was 2.7, IgG level 3,565 mg/dL and 18.16 mg/dL. Patient has preferred to remain on observation so far and declined Rd.  WBC count is 3.7, Hgb 13.9 and platelets 175.  He has had no issues with fatigued. No recent infections.  No fever, chills, n/v, cough, rash, dizziness, SOB, chest pain, palpitations, abdominal pain or changes in bowel or bladder habits.  No episodes of bleeding. No bruising or petechiae.  No swelling, numbness or tingling in her extremities.  He has generalized aches and pains due to arthritis.  No falls or syncope.  He has maintained a good appetite and is staying well hydrated. His weight is stable.   ECOG Performance Status: 1 - Symptomatic but completely ambulatory  Medications:  Allergies as of 01/12/2020      Reactions   Clindamycin Hcl Itching   Metoprolol Succinate Other (See Comments)   UNKNOWN REACTION      Medication List       Accurate as of January 12, 2020 10:08 AM. If you have any questions, ask your nurse or doctor.        amLODipine 10 MG tablet Commonly known as: NORVASC Take 10 mg by mouth every morning.   atorvastatin 80 MG tablet Commonly known as: LIPITOR Take 80 mg by mouth daily.   Januvia 100 MG tablet Generic drug: sitaGLIPtin Take 100 mg by mouth every morning.   metFORMIN 500 MG 24 hr tablet Commonly known as: GLUCOPHAGE-XR Take 4 tablets by mouth 2 (two) times daily.     omeprazole 20 MG capsule Commonly known as: PRILOSEC Take 20 mg by mouth 2 (two) times daily before a meal.   tadalafil 20 MG tablet Commonly known as: CIALIS Take 1/2-1 tablet (10-20 mg) daily as needed   Testosterone 25 MG/2.5GM (1%) Gel Place 1 mg onto the skin every other day.   triamterene-hydrochlorothiazide 37.5-25 MG tablet Commonly known as: MAXZIDE-25 Take 1 tablet by mouth daily.   valsartan 320 MG tablet Commonly known as: DIOVAN Take 320 mg by mouth daily.       Allergies:  Allergies  Allergen Reactions  . Clindamycin Hcl Itching  . Metoprolol Succinate Other (See Comments)    UNKNOWN REACTION    Past Medical History, Surgical history, Social history, and Family History were reviewed and updated.  Review of Systems: All other 10 point review of systems is negative.   Physical Exam:  height is '5\' 8"'  (1.727 m) and weight is 190 lb (86.2 kg). His blood pressure is 122/70 and his pulse is 85. His respiration is 16 and oxygen saturation is 98%.   Wt Readings from Last 3 Encounters:  01/12/20 190 lb (86.2 kg)  11/09/19 193 lb 1.3 oz (87.6 kg)  08/11/19 189 lb 1.9 oz (85.8 kg)    Ocular: Sclerae unicteric, pupils equal, round and reactive to light Ear-nose-throat: Oropharynx clear, dentition fair Lymphatic: No cervical or supraclavicular adenopathy Lungs no rales or rhonchi,  good excursion bilaterally Heart regular rate and rhythm, no murmur appreciated Abd soft, nontender, positive bowel sounds, no liver or spleen tip palpated on exam, no fluid wave  MSK no focal spinal tenderness, no joint edema Neuro: non-focal, well-oriented, appropriate affect Breasts: Deferred   Lab Results  Component Value Date   WBC 3.9 (L) 01/12/2020   HGB 13.2 01/12/2020   HCT 38.5 (L) 01/12/2020   MCV 90.6 01/12/2020   PLT 144 (L) 01/12/2020   Lab Results  Component Value Date   FERRITIN 10 (L) 02/03/2019   IRON 36 (L) 02/03/2019   TIBC 466 (H) 02/03/2019   UIBC  431 (H) 02/03/2019   IRONPCTSAT 8 (L) 02/03/2019   Lab Results  Component Value Date   RETICCTPCT 2.1 12/10/2006   RBC 4.25 01/12/2020   Lab Results  Component Value Date   KPAFRELGTCHN 181.6 (H) 11/09/2019   LAMBDASER 6.4 11/09/2019   KAPLAMBRATIO 28.38 (H) 11/09/2019   Lab Results  Component Value Date   IGGSERUM 3,565 (H) 11/09/2019   IGA 31 (L) 11/09/2019   IGMSERUM 18 11/09/2019   Lab Results  Component Value Date   TOTALPROTELP 8.7 (H) 11/09/2019     Chemistry      Component Value Date/Time   NA 134 (L) 01/12/2020 0935   NA 135 04/07/2019 1047   K 3.7 01/12/2020 0935   CL 101 01/12/2020 0935   CO2 26 01/12/2020 0935   BUN 20 01/12/2020 0935   BUN 18 04/07/2019 1047   CREATININE 1.11 01/12/2020 0935   CREATININE 1.12 09/03/2015 1201      Component Value Date/Time   CALCIUM 9.5 01/12/2020 0935   ALKPHOS 42 01/12/2020 0935   AST 22 01/12/2020 0935   ALT 23 01/12/2020 0935   BILITOT 0.4 01/12/2020 0935       Impression and Plan: [name] is a very pleasant 74 yo caucasian gentleman with smoldering IgG kappa multiple myeloma, high risk.  Myeloma studies are pending. Patient so far has opted for observation and declined treatment with Rd.  We will see what his counts look like and go over results with him once available.  We will go ahead and plan to see him again in another 3 months.  He can contact our office with any questions or concerns. We can certainly see him sooner if needed.   Laverna Peace, NP 7/16/202110:08 AM

## 2020-01-15 ENCOUNTER — Telehealth: Payer: Self-pay | Admitting: Family

## 2020-01-15 LAB — KAPPA/LAMBDA LIGHT CHAINS
Kappa free light chain: 177.4 mg/L — ABNORMAL HIGH (ref 3.3–19.4)
Kappa, lambda light chain ratio: 24.99 — ABNORMAL HIGH (ref 0.26–1.65)
Lambda free light chains: 7.1 mg/L (ref 5.7–26.3)

## 2020-01-15 NOTE — Telephone Encounter (Signed)
Appointments scheduled calendar printed & mailed per 7/16 los 

## 2020-01-16 LAB — MULTIPLE MYELOMA PANEL, SERUM
Albumin SerPl Elph-Mcnc: 3.8 g/dL (ref 2.9–4.4)
Albumin/Glob SerPl: 0.9 (ref 0.7–1.7)
Alpha 1: 0.2 g/dL (ref 0.0–0.4)
Alpha2 Glob SerPl Elph-Mcnc: 0.7 g/dL (ref 0.4–1.0)
B-Globulin SerPl Elph-Mcnc: 0.8 g/dL (ref 0.7–1.3)
Gamma Glob SerPl Elph-Mcnc: 2.6 g/dL — ABNORMAL HIGH (ref 0.4–1.8)
Globulin, Total: 4.3 g/dL — ABNORMAL HIGH (ref 2.2–3.9)
IgA: 30 mg/dL — ABNORMAL LOW (ref 61–437)
IgG (Immunoglobin G), Serum: 3571 mg/dL — ABNORMAL HIGH (ref 603–1613)
IgM (Immunoglobulin M), Srm: 16 mg/dL (ref 15–143)
M Protein SerPl Elph-Mcnc: 2.5 g/dL — ABNORMAL HIGH
Total Protein ELP: 8.1 g/dL (ref 6.0–8.5)

## 2020-04-22 ENCOUNTER — Ambulatory Visit: Payer: Medicare Other | Admitting: Hematology & Oncology

## 2020-04-22 ENCOUNTER — Other Ambulatory Visit: Payer: Medicare Other

## 2020-04-24 ENCOUNTER — Telehealth: Payer: Self-pay | Admitting: Hematology & Oncology

## 2020-04-24 NOTE — Telephone Encounter (Signed)
Patient called 10/26 requesting to r/s his 10/29 appointments.  Appointments were moved a requested and a detailed message was left on his voicemail as well.

## 2020-04-26 ENCOUNTER — Inpatient Hospital Stay: Payer: Medicare Other

## 2020-04-26 ENCOUNTER — Inpatient Hospital Stay: Payer: Medicare Other | Admitting: Hematology & Oncology

## 2020-05-01 ENCOUNTER — Ambulatory Visit: Payer: Medicare Other | Admitting: Internal Medicine

## 2020-05-13 DIAGNOSIS — I4510 Unspecified right bundle-branch block: Secondary | ICD-10-CM | POA: Insufficient documentation

## 2020-05-16 ENCOUNTER — Other Ambulatory Visit: Payer: Self-pay

## 2020-05-16 ENCOUNTER — Encounter: Payer: Self-pay | Admitting: Internal Medicine

## 2020-05-16 ENCOUNTER — Ambulatory Visit (INDEPENDENT_AMBULATORY_CARE_PROVIDER_SITE_OTHER): Payer: Medicare Other | Admitting: Internal Medicine

## 2020-05-16 VITALS — BP 120/80 | HR 71 | Ht 68.0 in | Wt 190.2 lb

## 2020-05-16 DIAGNOSIS — I712 Thoracic aortic aneurysm, without rupture, unspecified: Secondary | ICD-10-CM

## 2020-05-16 DIAGNOSIS — I4510 Unspecified right bundle-branch block: Secondary | ICD-10-CM

## 2020-05-16 NOTE — Progress Notes (Signed)
Patient Care Team: [name], MD as PCP - General (Family Medicine) Tish Men, MD (Inactive) as Medical Oncologist (Hematology)   HPI  [name] is a 74 y.o. male Seen in followup for a history of a catheterization in 2008 demonstrate severe LAD disease and moderate circumflex disease with an occluded posterolateral branch. He had normal LV function with perfusion abnormality on Myoview scan is wanting to that distribution. He underwent DES stenting  He also has a thoracic aortic aneurysm  Intercurrently diagnosed with monoclonal gammopathy -- no therapy indicated,    DATE TEST   9/16 CTA 4.3 cm   3/17 CTA 4.3 cm  5/18 CTA 4.3 cm  9/19 CTA 4.3       Date Cr K Hgb  7/21 1.11 3.7 13.2            DATE TEST EF   2013 Echo   55-60 %   9/19* Myoview  52 % No ischemia/Infarct       The patient denies chest pain, shortness of breath, nocturnal dyspnea, orthopnea or peripheral edema.  There have been no palpitations, lightheadedness or syncope.    This summer had an on the golf course shot a 43    Past Medical History:  Diagnosis Date  . Anemia   . BPH (benign prostatic hyperplasia)   . CAD (coronary artery disease)   . Collagen vascular disease ([place])   . Colon polyp   . Diabetes mellitus without complication ([place])   . Esophageal reflux   . Fe deficiency anemia 2012  . FHx: colonic polyps 11/04, 01/2009  . Gastritis   . Hearing loss    wears hearing aid  . Heart attack ([place])   . Hemorrhoids, internal   . Hyperlipidemia   . Hypertension   . Hypogonadism male   . Smoldering myeloma ([place])   . Smoldering myeloma ([place])   . Wears glasses     Past Surgical History:  Procedure Laterality Date  . APPENDECTOMY    . CORONARY ANGIOPLASTY WITH STENT PLACEMENT  2007  . HAND SURGERY     left 2010, right 2012  . [place]   left  . left knee     x3  . left shoulder    . PROSTATE SURGERY    . Right CTS     ?  . ROTATOR CUFF REPAIR Right  12/2012  . TOTAL KNEE ARTHROPLASTY Right 03/08/2015   Procedure: RIGHT TOTAL KNEE ARTHROPLASTY;  Surgeon: Sydnee Cabal, MD;  Location: [facility];  Service: Orthopedics;  Laterality: Right;  [name] VASECTOMY      Current Outpatient Medications  Medication Sig Dispense Refill  . amLODipine (NORVASC) 10 MG tablet Take 10 mg by mouth every morning.    [name] atorvastatin (LIPITOR) 80 MG tablet Take 80 mg by mouth daily.    [name] ezetimibe (ZETIA) 10 MG tablet Take 10 mg by mouth daily.    . metFORMIN (GLUCOPHAGE-XR) 500 MG 24 hr tablet Take 4 tablets by mouth 2 (two) times daily.    [name] omeprazole (PRILOSEC) 20 MG capsule Take 20 mg by mouth 2 (two) times daily before a meal.     . rosuvastatin (CRESTOR) 40 MG tablet Take 40 mg by mouth daily.    . tadalafil (CIALIS) 20 MG tablet Take 1/2-1 tablet (10-20 mg) daily as needed    . Testosterone 25 MG/2.5GM (1%) GEL Place 1 mg onto the skin every other day.    [name]  triamterene-hydrochlorothiazide (MAXZIDE-25) 37.5-25 MG tablet Take 1 tablet by mouth daily.    . TRULICITY 3.53 GD/9.2EQ SOPN Inject into the skin.    . valsartan (DIOVAN) 320 MG tablet Take 320 mg by mouth daily.     No current facility-administered medications for this visit.    Allergies  Allergen Reactions  . Clindamycin Hcl Itching  . Metoprolol Succinate Other (See Comments)    UNKNOWN REACTION    Review of Systems negative except from HPI and PMH  Physical Exam BP 120/80   Pulse 71   Ht 5\' 8"  (1.727 m)   Wt 190 lb 3.2 oz (86.3 kg)   SpO2 98%   BMI 28.92 kg/m  Well developed and nourished in no acute distress HENT normal Neck supple with JVP-  Flat  Clear Regular rate and rhythm, no murmurs or gallops Abd-soft with active BS No Clubbing cyanosis edema Skin-warm and dry A & Oriented  Grossly normal sensory and motor function  ECG  Sinus @ 71 24/15/42  Assessment and  Plan  CAD  S/p stenting  Ascending aortic aneurysm   Hypertension  Monoclonal Gammopathy  RBBB  BP  well controlled   Will get LDL from PCP  Will reassess ascending aortic aneurysm.  By echo.  Without symptoms of ischemia

## 2020-05-16 NOTE — Patient Instructions (Signed)
Medication Instructions:  Your physician recommends that you continue on your current medications as directed. Please refer to the Current Medication list given to you today.  *If you need a refill on your cardiac medications before your next appointment, please call your pharmacy*   Lab Work: None ordered.  If you have labs (blood work) drawn today and your tests are completely normal, you will receive your results only by: . MyChart Message (if you have MyChart) OR . A paper copy in the mail If you have any lab test that is abnormal or we need to change your treatment, we will call you to review the results.   Testing/Procedures: Your physician has requested that you have an echocardiogram. Echocardiography is a painless test that uses sound waves to create images of your heart. It provides your doctor with information about the size and shape of your heart and how well your heart's chambers and valves are working. This procedure takes approximately one hour. There are no restrictions for this procedure.      Follow-Up: At [facility], you and your health needs are our priority.  As part of our continuing mission to provide you with exceptional heart care, we have created designated Provider Care Teams.  These Care Teams include your primary Cardiologist (physician) and Advanced Practice Providers (APPs -  Physician Assistants and Nurse Practitioners) who all work together to provide you with the care you need, when you need it.  We recommend signing up for the patient portal called "MyChart".  Sign up information is provided on this After Visit Summary.  MyChart is used to connect with patients for Virtual Visits (Telemedicine).  Patients are able to view lab/test results, encounter notes, upcoming appointments, etc.  Non-urgent messages can be sent to your provider as well.   To learn more about what you can do with MyChart, go to [email or web address].    Your next appointment:    12 months  The format for your next appointment:   In Person  Provider:   Steven Klein, MD     

## 2020-05-21 ENCOUNTER — Inpatient Hospital Stay (HOSPITAL_BASED_OUTPATIENT_CLINIC_OR_DEPARTMENT_OTHER): Payer: Medicare Other | Admitting: Hematology & Oncology

## 2020-05-21 ENCOUNTER — Inpatient Hospital Stay: Payer: Medicare Other | Attending: Hematology & Oncology

## 2020-05-21 ENCOUNTER — Encounter: Payer: Self-pay | Admitting: Hematology & Oncology

## 2020-05-21 ENCOUNTER — Other Ambulatory Visit: Payer: Self-pay

## 2020-05-21 ENCOUNTER — Telehealth: Payer: Self-pay | Admitting: Hematology & Oncology

## 2020-05-21 VITALS — BP 140/75 | HR 74 | Temp 98.0°F | Resp 18 | Wt 193.0 lb

## 2020-05-21 DIAGNOSIS — D472 Monoclonal gammopathy: Secondary | ICD-10-CM

## 2020-05-21 DIAGNOSIS — C9000 Multiple myeloma not having achieved remission: Secondary | ICD-10-CM | POA: Diagnosis present

## 2020-05-21 LAB — CBC WITH DIFFERENTIAL (CANCER CENTER ONLY)
Abs Immature Granulocytes: 0 10*3/uL (ref 0.00–0.07)
Basophils Absolute: 0 10*3/uL (ref 0.0–0.1)
Basophils Relative: 0 %
Eosinophils Absolute: 0.1 10*3/uL (ref 0.0–0.5)
Eosinophils Relative: 3 %
HCT: 37.8 % — ABNORMAL LOW (ref 39.0–52.0)
Hemoglobin: 12.6 g/dL — ABNORMAL LOW (ref 13.0–17.0)
Immature Granulocytes: 0 %
Lymphocytes Relative: 42 %
Lymphs Abs: 1.1 10*3/uL (ref 0.7–4.0)
MCH: 30.9 pg (ref 26.0–34.0)
MCHC: 33.3 g/dL (ref 30.0–36.0)
MCV: 92.6 fL (ref 80.0–100.0)
Monocytes Absolute: 0.2 10*3/uL (ref 0.1–1.0)
Monocytes Relative: 8 %
Neutro Abs: 1.2 10*3/uL — ABNORMAL LOW (ref 1.7–7.7)
Neutrophils Relative %: 47 %
Platelet Count: 158 10*3/uL (ref 150–400)
RBC: 4.08 MIL/uL — ABNORMAL LOW (ref 4.22–5.81)
RDW: 13 % (ref 11.5–15.5)
WBC Count: 2.7 10*3/uL — ABNORMAL LOW (ref 4.0–10.5)
nRBC: 0 % (ref 0.0–0.2)

## 2020-05-21 LAB — CMP (CANCER CENTER ONLY)
ALT: 21 U/L (ref 0–44)
AST: 20 U/L (ref 15–41)
Albumin: 4.1 g/dL (ref 3.5–5.0)
Alkaline Phosphatase: 40 U/L (ref 38–126)
Anion gap: 8 (ref 5–15)
BUN: 17 mg/dL (ref 8–23)
CO2: 30 mmol/L (ref 22–32)
Calcium: 10.1 mg/dL (ref 8.9–10.3)
Chloride: 99 mmol/L (ref 98–111)
Creatinine: 1.07 mg/dL (ref 0.61–1.24)
GFR, Estimated: >60 mL/min (ref >60)
Glucose, Bld: 230 mg/dL — ABNORMAL HIGH (ref 70–99)
Potassium: 3.6 mmol/L (ref 3.5–5.1)
Sodium: 137 mmol/L (ref 135–145)
Total Bilirubin: 0.2 mg/dL — ABNORMAL LOW (ref 0.3–1.2)
Total Protein: 8.8 g/dL — ABNORMAL HIGH (ref 6.5–8.1)

## 2020-05-21 LAB — LACTATE DEHYDROGENASE: LDH: 129 U/L (ref 98–192)

## 2020-05-21 NOTE — Progress Notes (Signed)
Hematology and Oncology Follow Up Visit  [name] [id number] 1946-01-06 74 y.o. 05/21/2020   Principle Diagnosis:  Smoldering IgG kappa multiple myeloma, high risk  Incidental exophytic lesion of R kidney Intermittent iron deficiency anemia   Current Therapy:   IV iron as indicated  On observation     Interim History:  [name] is here today for follow-up.  This is the first time that I am seeing him.  He was followed by Dr. Maylon Peppers.  He has a smoldering myeloma that we would be considered high risk.  He has declined any therapy for right now.   He was last seen back in July.  At that time, his M spike was 2.5 g/dL.  His IgG level was 3571 mg/dL.  His Kappa light chain was 17.7 mg/dL.  He feels well.  He has no complaints.  He has been active.  He plays golf twice a week.  He exercises at home.  He looks like he is in good shape.  He has had no problems with nausea or vomiting.  He has had no bony pain.  He has had no fever.  He has had no change in bowel or bladder habits.  There is been no bleeding.  He has had no leg swelling.  Overall, I was his performance status is ECOG 0.    Medications:  Allergies as of 05/21/2020      Reactions   Clindamycin Hcl Itching   Metoprolol Succinate Other (See Comments)   UNKNOWN REACTION      Medication List       Accurate as of May 21, 2020  2:58 PM. If you have any questions, ask your nurse or doctor.        amLODipine 10 MG tablet Commonly known as: NORVASC Take 10 mg by mouth every morning.   atorvastatin 80 MG tablet Commonly known as: LIPITOR Take 80 mg by mouth daily.   ezetimibe 10 MG tablet Commonly known as: ZETIA Take 10 mg by mouth daily.   metFORMIN 500 MG 24 hr tablet Commonly known as: GLUCOPHAGE-XR Take 4 tablets by mouth 2 (two) times daily.   omeprazole 20 MG capsule Commonly known as: PRILOSEC Take 20 mg by mouth 2 (two) times daily before a meal.   rosuvastatin 40 MG tablet Commonly  known as: CRESTOR Take 40 mg by mouth daily.   tadalafil 20 MG tablet Commonly known as: CIALIS Take 1/2-1 tablet (10-20 mg) daily as needed   Testosterone 25 MG/2.5GM (1%) Gel Place 1 mg onto the skin every other day.   triamterene-hydrochlorothiazide 37.5-25 MG tablet Commonly known as: MAXZIDE-25 Take 1 tablet by mouth daily.   Trulicity 0.53 ZJ/6.7HA Sopn Generic drug: Dulaglutide Inject into the skin.   valsartan 320 MG tablet Commonly known as: DIOVAN Take 320 mg by mouth daily.       Allergies:  Allergies  Allergen Reactions  . Clindamycin Hcl Itching  . Metoprolol Succinate Other (See Comments)    UNKNOWN REACTION    Past Medical History, Surgical history, Social history, and Family History were reviewed and updated.  Review of Systems: Review of Systems  Constitutional: Negative.   HENT: Negative.   Eyes: Negative.   Respiratory: Negative.   Cardiovascular: Negative.   Gastrointestinal: Negative.   Genitourinary: Negative.   Musculoskeletal: Negative.   Skin: Negative.   Neurological: Negative.   Endo/Heme/Allergies: Negative.   Psychiatric/Behavioral: Negative.       Physical Exam:  weight is 193 lb (87.5  kg). His oral temperature is 98 F (36.7 C). His blood pressure is 140/75 and his pulse is 74. His respiration is 18 and oxygen saturation is 96%.   Wt Readings from Last 3 Encounters:  05/21/20 193 lb (87.5 kg)  05/16/20 190 lb 3.2 oz (86.3 kg)  01/12/20 190 lb (86.2 kg)    Physical Exam Vitals reviewed.  HENT:     Head: Normocephalic and atraumatic.  Eyes:     Pupils: Pupils are equal, round, and reactive to light.  Cardiovascular:     Rate and Rhythm: Normal rate and regular rhythm.     Heart sounds: Normal heart sounds.  Pulmonary:     Effort: Pulmonary effort is normal.     Breath sounds: Normal breath sounds.  Abdominal:     General: Bowel sounds are normal.     Palpations: Abdomen is soft.  Musculoskeletal:         General: No tenderness or deformity. Normal range of motion.     Cervical back: Normal range of motion.  Lymphadenopathy:     Cervical: No cervical adenopathy.  Skin:    General: Skin is warm and dry.     Findings: No erythema or rash.  Neurological:     Mental Status: He is alert and oriented to person, place, and time.  Psychiatric:        Behavior: Behavior normal.        Thought Content: Thought content normal.        Judgment: Judgment normal.      Lab Results  Component Value Date   WBC 2.7 (L) 05/21/2020   HGB 12.6 (L) 05/21/2020   HCT 37.8 (L) 05/21/2020   MCV 92.6 05/21/2020   PLT 158 05/21/2020   Lab Results  Component Value Date   FERRITIN 10 (L) 02/03/2019   IRON 36 (L) 02/03/2019   TIBC 466 (H) 02/03/2019   UIBC 431 (H) 02/03/2019   IRONPCTSAT 8 (L) 02/03/2019   Lab Results  Component Value Date   RETICCTPCT 2.1 12/10/2006   RBC 4.08 (L) 05/21/2020   Lab Results  Component Value Date   KPAFRELGTCHN 177.4 (H) 01/12/2020   LAMBDASER 7.1 01/12/2020   KAPLAMBRATIO 24.99 (H) 01/12/2020   Lab Results  Component Value Date   IGGSERUM 3,571 (H) 01/12/2020   IGA 30 (L) 01/12/2020   IGMSERUM 16 01/12/2020   Lab Results  Component Value Date   TOTALPROTELP 8.1 01/12/2020     Chemistry      Component Value Date/Time   NA 137 05/21/2020 1348   NA 135 04/07/2019 1047   K 3.6 05/21/2020 1348   CL 99 05/21/2020 1348   CO2 30 05/21/2020 1348   BUN 17 05/21/2020 1348   BUN 18 04/07/2019 1047   CREATININE 1.07 05/21/2020 1348   CREATININE 1.12 09/03/2015 1201      Component Value Date/Time   CALCIUM 10.1 05/21/2020 1348   ALKPHOS 40 05/21/2020 1348   AST 20 05/21/2020 1348   ALT 21 05/21/2020 1348   BILITOT 0.2 (L) 05/21/2020 1348       Impression and Plan: [name] is a very pleasant 74 yo caucasian gentleman with smoldering IgG kappa multiple myeloma.  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  We will have to see what his myeloma  studies are right now.  His white cell count is down a little bit so this might be a little bit troublesome for myeloma progression.  His total protein level  is a little bit higher so this might also indicate a progression of his smoldering myeloma.  He is totally asymptomatic however.  He really does not want him to have any treatment unless absolutely necessary.  We will follow him in 3 months.  If his myeloma studies come back significantly different, we will get him back sooner and we will see how everything looks.  Volanda Napoleon, MD 11/23/20212:58 PM

## 2020-05-21 NOTE — Telephone Encounter (Signed)
Appointments scheduled patient has My Chart Access per 11/23 los

## 2020-05-22 ENCOUNTER — Telehealth: Payer: Self-pay

## 2020-05-22 LAB — KAPPA/LAMBDA LIGHT CHAINS
Kappa free light chain: 185.7 mg/L — ABNORMAL HIGH (ref 3.3–19.4)
Kappa, lambda light chain ratio: 24.12 — ABNORMAL HIGH (ref 0.26–1.65)
Lambda free light chains: 7.7 mg/L (ref 5.7–26.3)

## 2020-05-22 NOTE — Telephone Encounter (Signed)
No 05/22/20 LOS entered   AOM

## 2020-05-24 LAB — MULTIPLE MYELOMA PANEL, SERUM
Albumin SerPl Elph-Mcnc: 3.8 g/dL (ref 2.9–4.4)
Albumin/Glob SerPl: 0.9 (ref 0.7–1.7)
Alpha 1: 0.3 g/dL (ref 0.0–0.4)
Alpha2 Glob SerPl Elph-Mcnc: 0.7 g/dL (ref 0.4–1.0)
B-Globulin SerPl Elph-Mcnc: 0.9 g/dL (ref 0.7–1.3)
Gamma Glob SerPl Elph-Mcnc: 2.7 g/dL — ABNORMAL HIGH (ref 0.4–1.8)
Globulin, Total: 4.6 g/dL — ABNORMAL HIGH (ref 2.2–3.9)
IgA: 32 mg/dL — ABNORMAL LOW (ref 61–437)
IgG (Immunoglobin G), Serum: 3405 mg/dL — ABNORMAL HIGH (ref 603–1613)
IgM (Immunoglobulin M), Srm: 19 mg/dL (ref 15–143)
M Protein SerPl Elph-Mcnc: 2.5 g/dL — ABNORMAL HIGH
Total Protein ELP: 8.4 g/dL (ref 6.0–8.5)

## 2020-06-04 ENCOUNTER — Telehealth: Payer: Self-pay | Admitting: Internal Medicine

## 2020-06-04 NOTE — Telephone Encounter (Signed)
Sherri is calling from [employer] PCP office and is requesting a callback in regards to the patient's CT order. I advised her based on the notes it was canceled due to insurance and she is wanting to discuss it further. She is also requesting a new request be placed so the CT can be scheduled due to Dr. [name] wanting him to have it performed. Please advise.

## 2020-06-04 NOTE — Telephone Encounter (Signed)
Spoke with Sherri at PCP office and advised per Dr Olin Pia last note he would re-evaluate pt's Ascending Aortic Aneurysm by echo.  Sherri verbalizes understanding and will let pt's PCP know.

## 2020-06-10 ENCOUNTER — Ambulatory Visit (HOSPITAL_COMMUNITY): Payer: Medicare Other | Attending: Cardiology

## 2020-06-10 ENCOUNTER — Other Ambulatory Visit: Payer: Self-pay

## 2020-06-10 DIAGNOSIS — I712 Thoracic aortic aneurysm, without rupture, unspecified: Secondary | ICD-10-CM

## 2020-06-10 LAB — ECHOCARDIOGRAM COMPLETE
Area-P 1/2: 2.39 cm2
S' Lateral: 2.6 cm

## 2020-06-17 ENCOUNTER — Telehealth: Payer: Self-pay

## 2020-06-17 DIAGNOSIS — I712 Thoracic aortic aneurysm, without rupture, unspecified: Secondary | ICD-10-CM

## 2020-06-17 NOTE — Telephone Encounter (Signed)
Attempted phone call to pt.  Left voicemail to contact RN at [phone]. 

## 2020-06-17 NOTE — Telephone Encounter (Signed)
-----   Message from Deboraha Sprang, MD sent at 06/13/2020  4:25 PM EST ----- Please Inform Patient Echo showed  norma  heart muscle function but  The aorta appears to have enlarged modestly since the last study   sometimes the echo can overestimate the size change so we should CTA of the AORTA  Thanks SK

## 2020-06-17 NOTE — Telephone Encounter (Signed)
Spoke with pt and advised per Dr Caryl Comes, echo shows normal heart muscle function but aorta appeared to have enlarged modestly since last study.  Dr Caryl Comes recommends CTA of the Aorta as sometimes echo can overestimate aortic size.  Pt verbalizes understanding and is agreeable to CTA.  Pt advised he will be contacted to schedule appointment.  Pt thanked Therapist, sports for call. Order placed and message sent to precert and [facility].

## 2020-06-18 ENCOUNTER — Telehealth: Payer: Self-pay | Admitting: *Deleted

## 2020-06-18 DIAGNOSIS — Z0189 Encounter for other specified special examinations: Secondary | ICD-10-CM

## 2020-06-18 DIAGNOSIS — I712 Thoracic aortic aneurysm, without rupture, unspecified: Secondary | ICD-10-CM

## 2020-06-18 NOTE — Telephone Encounter (Signed)
-----   Message from Olean Ree, RT sent at 06/18/2020 11:14 AM EST ----- Regarding: LABS Hey,  This patient is coming in on Dec 29 at 11:15 am and need BUN, Creatinine and GFR drawn. I need a lab order placed please!  Thanks, Nunzio Cobbs

## 2020-06-18 NOTE — Telephone Encounter (Signed)
Order for BMET placed for this pt to have drawn prior to his CT Angio Chest Aorta on 06/26/20.   This is per CT protocol. BMET placed and CT dept to arrange lab appt.

## 2020-06-20 ENCOUNTER — Other Ambulatory Visit: Payer: Self-pay

## 2020-06-20 ENCOUNTER — Other Ambulatory Visit: Payer: Medicare Other | Admitting: *Deleted

## 2020-06-20 DIAGNOSIS — Z0189 Encounter for other specified special examinations: Secondary | ICD-10-CM

## 2020-06-20 DIAGNOSIS — I712 Thoracic aortic aneurysm, without rupture, unspecified: Secondary | ICD-10-CM

## 2020-06-21 LAB — BASIC METABOLIC PANEL
BUN/Creatinine Ratio: 18 (ref 10–24)
BUN: 17 mg/dL (ref 8–27)
CO2: 24 mmol/L (ref 20–29)
Calcium: 10.2 mg/dL (ref 8.6–10.2)
Chloride: 98 mmol/L (ref 96–106)
Creatinine, Ser: 0.93 mg/dL (ref 0.76–1.27)
GFR calc Af Amer: 92 mL/min/{1.73_m2} (ref >59)
GFR calc non Af Amer: 79 mL/min/{1.73_m2} (ref >59)
Glucose: 169 mg/dL — ABNORMAL HIGH (ref 65–99)
Potassium: 4 mmol/L (ref 3.5–5.2)
Sodium: 139 mmol/L (ref 134–144)

## 2020-06-26 ENCOUNTER — Other Ambulatory Visit: Payer: Medicare Other

## 2020-07-10 ENCOUNTER — Ambulatory Visit
Admission: RE | Admit: 2020-07-10 | Discharge: 2020-07-10 | Disposition: A | Discharge status: Home / Self Care | Payer: Medicare Other | Source: Ambulatory Visit | Attending: Internal Medicine | Admitting: Internal Medicine

## 2020-07-10 ENCOUNTER — Other Ambulatory Visit: Payer: Self-pay

## 2020-07-10 ENCOUNTER — Telehealth: Payer: Self-pay

## 2020-07-10 DIAGNOSIS — I712 Thoracic aortic aneurysm, without rupture, unspecified: Secondary | ICD-10-CM

## 2020-07-10 MED ORDER — IOPAMIDOL (ISOVUE-370) INJECTION 76%
75.0000 mL | Freq: Once | INTRAVENOUS | Status: AC | PRN
  Administered 2020-07-10: 75 mL via INTRAVENOUS

## 2020-07-10 NOTE — Telephone Encounter (Addendum)
Attempted phone call to pt and left voicemail message, OK per Epic, that per Dr Caryl Comes pt's Aorta is stable and DO NOT use Cipro or similar antibiotics as they can be associated with increase risk of aortic aneurysm.  Advised pt to call with any further questions or concerns.

## 2020-07-10 NOTE — Telephone Encounter (Signed)
-----   Message from Deboraha Sprang, MD sent at 07/10/2020  2:47 PM EST ----- Please Inform Patient that abnormality IN AORTA is stable Patient TELL HIM  not TO USE Cipro and similar antibiotics. Recent studies have raised concern that fluoroquinolone antibiotics could be associated with an increased risk of aortic aneurysm Fluoroquinolones have non-antimicrobial properties that might jeopardise the integrity of the extracellular matrix of the vascular wall In a  propensity score matched cohort study in [place], there was a 66% increased rate of aortic aneurysm or dissection associated with oral fluoroquinolone use, compared with amoxicillin use, within a 60 day risk period from start of treatment    Thanks

## 2020-08-21 ENCOUNTER — Inpatient Hospital Stay: Payer: Medicare Other | Admitting: Hematology & Oncology

## 2020-08-21 ENCOUNTER — Inpatient Hospital Stay: Payer: Medicare Other | Attending: Hematology & Oncology

## 2020-08-21 ENCOUNTER — Other Ambulatory Visit: Payer: Self-pay

## 2020-08-21 DIAGNOSIS — C9000 Multiple myeloma not having achieved remission: Secondary | ICD-10-CM | POA: Diagnosis not present

## 2020-08-21 DIAGNOSIS — D472 Monoclonal gammopathy: Secondary | ICD-10-CM

## 2020-08-21 LAB — CMP (CANCER CENTER ONLY)
ALT: 23 U/L (ref 0–44)
AST: 21 U/L (ref 15–41)
Albumin: 4.3 g/dL (ref 3.5–5.0)
Alkaline Phosphatase: 45 U/L (ref 38–126)
Anion gap: 7 (ref 5–15)
BUN: 23 mg/dL (ref 8–23)
CO2: 29 mmol/L (ref 22–32)
Calcium: 10.3 mg/dL (ref 8.9–10.3)
Chloride: 99 mmol/L (ref 98–111)
Creatinine: 1.21 mg/dL (ref 0.61–1.24)
GFR, Estimated: >60 mL/min (ref >60)
Glucose, Bld: 228 mg/dL — ABNORMAL HIGH (ref 70–99)
Potassium: 3.7 mmol/L (ref 3.5–5.1)
Sodium: 135 mmol/L (ref 135–145)
Total Bilirubin: 0.4 mg/dL (ref 0.3–1.2)
Total Protein: 8.9 g/dL — ABNORMAL HIGH (ref 6.5–8.1)

## 2020-08-21 LAB — CBC WITH DIFFERENTIAL (CANCER CENTER ONLY)
Abs Immature Granulocytes: 0.01 10*3/uL (ref 0.00–0.07)
Basophils Absolute: 0 10*3/uL (ref 0.0–0.1)
Basophils Relative: 1 %
Eosinophils Absolute: 0.1 10*3/uL (ref 0.0–0.5)
Eosinophils Relative: 3 %
HCT: 38.2 % — ABNORMAL LOW (ref 39.0–52.0)
Hemoglobin: 13.3 g/dL (ref 13.0–17.0)
Immature Granulocytes: 0 %
Lymphocytes Relative: 38 %
Lymphs Abs: 1.5 10*3/uL (ref 0.7–4.0)
MCH: 31.1 pg (ref 26.0–34.0)
MCHC: 34.8 g/dL (ref 30.0–36.0)
MCV: 89.5 fL (ref 80.0–100.0)
Monocytes Absolute: 0.2 10*3/uL (ref 0.1–1.0)
Monocytes Relative: 6 %
Neutro Abs: 2.1 10*3/uL (ref 1.7–7.7)
Neutrophils Relative %: 52 %
Platelet Count: 176 10*3/uL (ref 150–400)
RBC: 4.27 MIL/uL (ref 4.22–5.81)
RDW: 12.3 % (ref 11.5–15.5)
WBC Count: 4 10*3/uL (ref 4.0–10.5)
nRBC: 0 % (ref 0.0–0.2)

## 2020-08-21 LAB — LACTATE DEHYDROGENASE: LDH: 123 U/L (ref 98–192)

## 2020-08-22 ENCOUNTER — Telehealth: Payer: Self-pay | Admitting: *Deleted

## 2020-08-22 LAB — IGG, IGA, IGM
IgA: 30 mg/dL — ABNORMAL LOW (ref 61–437)
IgG (Immunoglobin G), Serum: 3619 mg/dL — ABNORMAL HIGH (ref 603–1613)
IgM (Immunoglobulin M), Srm: 18 mg/dL (ref 15–143)

## 2020-08-22 LAB — KAPPA/LAMBDA LIGHT CHAINS
Kappa free light chain: 195.7 mg/L — ABNORMAL HIGH (ref 3.3–19.4)
Kappa, lambda light chain ratio: 25.75 — ABNORMAL HIGH (ref 0.26–1.65)
Lambda free light chains: 7.6 mg/L (ref 5.7–26.3)

## 2020-08-22 LAB — BETA 2 MICROGLOBULIN, SERUM: Beta-2 Microglobulin: 2.7 mg/L — ABNORMAL HIGH (ref 0.6–2.4)

## 2020-08-22 NOTE — Telephone Encounter (Signed)
No los 08/21/20 to schedule

## 2020-08-23 ENCOUNTER — Other Ambulatory Visit: Payer: Self-pay

## 2020-08-23 ENCOUNTER — Inpatient Hospital Stay: Payer: Medicare Other

## 2020-08-23 DIAGNOSIS — C9000 Multiple myeloma not having achieved remission: Secondary | ICD-10-CM | POA: Diagnosis not present

## 2020-08-23 DIAGNOSIS — D472 Monoclonal gammopathy: Secondary | ICD-10-CM

## 2020-08-26 LAB — UPEP/UIFE/LIGHT CHAINS/TP, 24-HR UR
% BETA, Urine: 80.3 %
ALPHA 1 URINE: 1.3 %
Albumin, U: 8.7 %
Alpha 2, Urine: 5.5 %
Free Kappa Lt Chains,Ur: 400.4 mg/L — ABNORMAL HIGH (ref 0.63–113.79)
Free Kappa/Lambda Ratio: 51.87 — ABNORMAL HIGH (ref 1.03–31.76)
Free Lambda Lt Chains,Ur: 7.72 mg/L (ref 0.47–11.77)
GAMMA GLOBULIN URINE: 4.2 %
M-SPIKE %, Urine: 35.6 % — ABNORMAL HIGH
M-Spike, Mg/24 Hr: 131 mg/24 hr — ABNORMAL HIGH
Total Protein, Urine-Ur/day: 369 mg/24 hr — ABNORMAL HIGH (ref 30–150)
Total Protein, Urine: 27.3 mg/dL
Total Volume: 1350

## 2020-08-27 LAB — PROTEIN ELECTROPHORESIS, SERUM, WITH REFLEX
A/G Ratio: 0.8 (ref 0.7–1.7)
Albumin ELP: 3.9 g/dL (ref 2.9–4.4)
Alpha-1-Globulin: 0.2 g/dL (ref 0.0–0.4)
Alpha-2-Globulin: 0.7 g/dL (ref 0.4–1.0)
Beta Globulin: 0.9 g/dL (ref 0.7–1.3)
Gamma Globulin: 2.8 g/dL — ABNORMAL HIGH (ref 0.4–1.8)
Globulin, Total: 4.6 g/dL — ABNORMAL HIGH (ref 2.2–3.9)
M-Spike, %: 2.6 g/dL — ABNORMAL HIGH
SPEP Interpretation: 0
Total Protein ELP: 8.5 g/dL (ref 6.0–8.5)

## 2020-08-27 LAB — IMMUNOFIXATION REFLEX, SERUM
IgA: 29 mg/dL — ABNORMAL LOW (ref 61–437)
IgG (Immunoglobin G), Serum: 3491 mg/dL — ABNORMAL HIGH (ref 603–1613)
IgM (Immunoglobulin M), Srm: 17 mg/dL (ref 15–143)

## 2020-09-05 ENCOUNTER — Telehealth: Payer: Self-pay

## 2020-09-05 NOTE — Telephone Encounter (Signed)
Pt came in for lab on 08/21/20 for lab and Dr Marin Olp but had to leave prior to md visit due to his sister falling.  Pt called back in to r/s appt and req not to come in until may.  Per inbasket/staff message this is ok with dr Marin Olp.     anne

## 2020-11-07 ENCOUNTER — Other Ambulatory Visit: Payer: Self-pay | Admitting: *Deleted

## 2020-11-07 DIAGNOSIS — D472 Monoclonal gammopathy: Secondary | ICD-10-CM

## 2020-11-07 DIAGNOSIS — C9000 Multiple myeloma not having achieved remission: Secondary | ICD-10-CM

## 2020-11-07 DIAGNOSIS — D72819 Decreased white blood cell count, unspecified: Secondary | ICD-10-CM

## 2020-11-07 DIAGNOSIS — N281 Cyst of kidney, acquired: Secondary | ICD-10-CM

## 2020-11-08 ENCOUNTER — Inpatient Hospital Stay: Payer: Medicare Other | Attending: Hematology & Oncology

## 2020-11-08 ENCOUNTER — Inpatient Hospital Stay: Payer: Medicare Other | Admitting: Hematology & Oncology

## 2021-01-22 ENCOUNTER — Other Ambulatory Visit (HOSPITAL_COMMUNITY): Payer: Self-pay | Admitting: Family Medicine

## 2021-01-22 DIAGNOSIS — R011 Cardiac murmur, unspecified: Secondary | ICD-10-CM

## 2021-02-06 ENCOUNTER — Encounter: Payer: Self-pay | Admitting: Hematology & Oncology

## 2021-02-06 ENCOUNTER — Inpatient Hospital Stay: Payer: Medicare Other | Attending: Hematology & Oncology

## 2021-02-06 ENCOUNTER — Inpatient Hospital Stay (HOSPITAL_BASED_OUTPATIENT_CLINIC_OR_DEPARTMENT_OTHER): Payer: Medicare Other | Admitting: Hematology & Oncology

## 2021-02-06 ENCOUNTER — Other Ambulatory Visit: Payer: Self-pay

## 2021-02-06 VITALS — BP 142/82 | HR 74 | Temp 98.4°F | Resp 18 | Wt 183.0 lb

## 2021-02-06 DIAGNOSIS — D72819 Decreased white blood cell count, unspecified: Secondary | ICD-10-CM

## 2021-02-06 DIAGNOSIS — D472 Monoclonal gammopathy: Secondary | ICD-10-CM

## 2021-02-06 DIAGNOSIS — C9000 Multiple myeloma not having achieved remission: Secondary | ICD-10-CM | POA: Diagnosis present

## 2021-02-06 DIAGNOSIS — D509 Iron deficiency anemia, unspecified: Secondary | ICD-10-CM | POA: Diagnosis not present

## 2021-02-06 DIAGNOSIS — D500 Iron deficiency anemia secondary to blood loss (chronic): Secondary | ICD-10-CM

## 2021-02-06 DIAGNOSIS — N281 Cyst of kidney, acquired: Secondary | ICD-10-CM

## 2021-02-06 LAB — CBC WITH DIFFERENTIAL (CANCER CENTER ONLY)
Abs Immature Granulocytes: 0.01 10*3/uL (ref 0.00–0.07)
Basophils Absolute: 0 10*3/uL (ref 0.0–0.1)
Basophils Relative: 0 %
Eosinophils Absolute: 0.1 10*3/uL (ref 0.0–0.5)
Eosinophils Relative: 3 %
HCT: 34.2 % — ABNORMAL LOW (ref 39.0–52.0)
Hemoglobin: 11.7 g/dL — ABNORMAL LOW (ref 13.0–17.0)
Immature Granulocytes: 0 %
Lymphocytes Relative: 42 %
Lymphs Abs: 1.4 10*3/uL (ref 0.7–4.0)
MCH: 31.4 pg (ref 26.0–34.0)
MCHC: 34.2 g/dL (ref 30.0–36.0)
MCV: 91.7 fL (ref 80.0–100.0)
Monocytes Absolute: 0.3 10*3/uL (ref 0.1–1.0)
Monocytes Relative: 8 %
Neutro Abs: 1.5 10*3/uL — ABNORMAL LOW (ref 1.7–7.7)
Neutrophils Relative %: 47 %
Platelet Count: 175 10*3/uL (ref 150–400)
RBC: 3.73 MIL/uL — ABNORMAL LOW (ref 4.22–5.81)
RDW: 12.8 % (ref 11.5–15.5)
WBC Count: 3.3 10*3/uL — ABNORMAL LOW (ref 4.0–10.5)
nRBC: 0 % (ref 0.0–0.2)

## 2021-02-06 LAB — CMP (CANCER CENTER ONLY)
ALT: 26 U/L (ref 0–44)
AST: 24 U/L (ref 15–41)
Albumin: 4 g/dL (ref 3.5–5.0)
Alkaline Phosphatase: 39 U/L (ref 38–126)
Anion gap: 7 (ref 5–15)
BUN: 16 mg/dL (ref 8–23)
CO2: 30 mmol/L (ref 22–32)
Calcium: 9.8 mg/dL (ref 8.9–10.3)
Chloride: 100 mmol/L (ref 98–111)
Creatinine: 1.01 mg/dL (ref 0.61–1.24)
GFR, Estimated: >60 mL/min (ref >60)
Glucose, Bld: 187 mg/dL — ABNORMAL HIGH (ref 70–99)
Potassium: 3.6 mmol/L (ref 3.5–5.1)
Sodium: 137 mmol/L (ref 135–145)
Total Bilirubin: 0.4 mg/dL (ref 0.3–1.2)
Total Protein: 8.3 g/dL — ABNORMAL HIGH (ref 6.5–8.1)

## 2021-02-06 LAB — LACTATE DEHYDROGENASE: LDH: 131 U/L (ref 98–192)

## 2021-02-06 NOTE — Progress Notes (Signed)
Hematology and Oncology Follow Up Visit  [name] [id number] [birth year] 75 y.o. 02/06/2021   Principle Diagnosis:  Smoldering IgG kappa multiple myeloma, high risk  Incidental exophytic lesion of R kidney Intermittent iron deficiency anemia   Current Therapy:   IV iron as indicated  On observation     Interim History:  [name] is here today for follow-up.  Is been 8 months since we last saw him.  Unfortunate, he has had to take care of his older sister who has Alzheimer's.  I really applaud him for doing this.  Otherwise he is doing quite well.  He really has had no complaints.  The last time I saw him back in November we had to do an MRI because of this kidney lesion on the right kidney.  Thankfully, the MRI did not show anything that looked suspicious for malignancy.  As far as his smoldering myeloma goes, back in February, his M spike was 2.6 g/dL.  His IgG level was 3500 mg/dL.  The Kappa light chain was 20 mg/dL.  He has had no bony pain.  He has had no cough.  He has had no issues with infections.  There is no bleeding.  Para he does have a little bit of leukopenia and anemia.  We will have to watch this closely.  Overall, his performance status is ECOG 0.    Medications:  Allergies as of 02/06/2021       Reactions   Clindamycin Hcl Itching   Metoprolol Succinate Other (See Comments)   UNKNOWN REACTION        Medication List        Accurate as of February 06, 2021  8:26 AM. If you have any questions, ask your nurse or doctor.          amLODipine 10 MG tablet Commonly known as: NORVASC Take 10 mg by mouth every morning.   atorvastatin 80 MG tablet Commonly known as: LIPITOR Take 80 mg by mouth daily.   cyclobenzaprine 5 MG tablet Commonly known as: FLEXERIL Take 5-10 mg by mouth at bedtime as needed.   ezetimibe 10 MG tablet Commonly known as: ZETIA Take 10 mg by mouth daily.   metFORMIN 500 MG 24 hr tablet Commonly known as:  GLUCOPHAGE-XR Take 4 tablets by mouth 2 (two) times daily.   omeprazole 20 MG capsule Commonly known as: PRILOSEC Take 20 mg by mouth 2 (two) times daily before a meal.   rosuvastatin 40 MG tablet Commonly known as: CRESTOR Take 40 mg by mouth daily.   tadalafil 20 MG tablet Commonly known as: CIALIS Take 1/2-1 tablet (10-20 mg) daily as needed   Testosterone 25 MG/2.5GM (1%) Gel Place 1 mg onto the skin every other day.   triamterene-hydrochlorothiazide 37.5-25 MG tablet Commonly known as: MAXZIDE-25 Take 1 tablet by mouth daily.   Trulicity 6.75 QG/9.2EF Sopn Generic drug: Dulaglutide Inject into the skin.   valsartan 320 MG tablet Commonly known as: DIOVAN Take 320 mg by mouth daily.        Allergies:  Allergies  Allergen Reactions   Clindamycin Hcl Itching   Metoprolol Succinate Other (See Comments)    UNKNOWN REACTION    Past Medical History, Surgical history, Social history, and Family History were reviewed and updated.  Review of Systems: Review of Systems  Constitutional: Negative.   HENT: Negative.    Eyes: Negative.   Respiratory: Negative.    Cardiovascular: Negative.   Gastrointestinal: Negative.   Genitourinary: Negative.  Musculoskeletal: Negative.   Skin: Negative.   Neurological: Negative.   Endo/Heme/Allergies: Negative.   Psychiatric/Behavioral: Negative.       Physical Exam:  weight is 183 lb (83 kg). His oral temperature is 98.4 F (36.9 C). His blood pressure is 142/82 (abnormal) and his pulse is 74. His respiration is 18 and oxygen saturation is 100%.   Wt Readings from Last 3 Encounters:  02/06/21 183 lb (83 kg)  05/21/20 193 lb (87.5 kg)  05/16/20 190 lb 3.2 oz (86.3 kg)    Physical Exam Vitals reviewed.  HENT:     Head: Normocephalic and atraumatic.  Eyes:     Pupils: Pupils are equal, round, and reactive to light.  Cardiovascular:     Rate and Rhythm: Normal rate and regular rhythm.     Heart sounds: Normal heart  sounds.  Pulmonary:     Effort: Pulmonary effort is normal.     Breath sounds: Normal breath sounds.  Abdominal:     General: Bowel sounds are normal.     Palpations: Abdomen is soft.  Musculoskeletal:        General: No tenderness or deformity. Normal range of motion.     Cervical back: Normal range of motion.  Lymphadenopathy:     Cervical: No cervical adenopathy.  Skin:    General: Skin is warm and dry.     Findings: No erythema or rash.  Neurological:     Mental Status: He is alert and oriented to person, place, and time.  Psychiatric:        Behavior: Behavior normal.        Thought Content: Thought content normal.        Judgment: Judgment normal.     Lab Results  Component Value Date   WBC 3.3 (L) 02/06/2021   HGB 11.7 (L) 02/06/2021   HCT 34.2 (L) 02/06/2021   MCV 91.7 02/06/2021   PLT 175 02/06/2021   Lab Results  Component Value Date   FERRITIN 10 (L) 02/03/2019   IRON 36 (L) 02/03/2019   TIBC 466 (H) 02/03/2019   UIBC 431 (H) 02/03/2019   IRONPCTSAT 8 (L) 02/03/2019   Lab Results  Component Value Date   RETICCTPCT 2.1 12/10/2006   RBC 3.73 (L) 02/06/2021   Lab Results  Component Value Date   KPAFRELGTCHN 195.7 (H) 08/21/2020   LAMBDASER 7.6 08/21/2020   KAPLAMBRATIO 51.87 (H) 08/23/2020   Lab Results  Component Value Date   IGGSERUM 3,619 (H) 08/21/2020   IGGSERUM 3,491 (H) 08/21/2020   IGA 30 (L) 08/21/2020   IGA 29 (L) 08/21/2020   IGMSERUM 18 08/21/2020   IGMSERUM 17 08/21/2020   Lab Results  Component Value Date   TOTALPROTELP 8.5 08/21/2020   ALBUMINELP 3.9 08/21/2020   A1GS 0.2 08/21/2020   A2GS 0.7 08/21/2020   BETS 0.9 08/21/2020   GAMS 2.8 (H) 08/21/2020   MSPIKE 2.6 (H) 08/21/2020     Chemistry      Component Value Date/Time   NA 137 02/06/2021 0755   NA 139 06/20/2020 0923   K 3.6 02/06/2021 0755   CL 100 02/06/2021 0755   CO2 30 02/06/2021 0755   BUN 16 02/06/2021 0755   BUN 17 06/20/2020 0923   CREATININE 1.01  02/06/2021 0755   CREATININE 1.12 09/03/2015 1201      Component Value Date/Time   CALCIUM 9.8 02/06/2021 0755   ALKPHOS 39 02/06/2021 0755   AST 24 02/06/2021 0755   ALT 26 02/06/2021  6438   BILITOT 0.4 02/06/2021 0755       Impression and Plan: [name] is a very pleasant 75 yo caucasian gentleman with smoldering IgG kappa multiple myeloma.  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  We will have to see what his myeloma studies are right now.  I must say that his myeloma levels of the last time certainly were borderline for actual myeloma.  It will be interesting to see what the levels are now.  He does have little bit of leukopenia and anemia.  We will have to watch this.  Overall, we will try to get him back after the holidays if possible.  If he has significant change in his levels, then we will have to get him back sooner.    Volanda Napoleon, MD 8/11/20228:27 AM

## 2021-02-07 LAB — KAPPA/LAMBDA LIGHT CHAINS
Kappa free light chain: 167.1 mg/L — ABNORMAL HIGH (ref 3.3–19.4)
Kappa, lambda light chain ratio: 23.87 — ABNORMAL HIGH (ref 0.26–1.65)
Lambda free light chains: 7 mg/L (ref 5.7–26.3)

## 2021-02-07 LAB — BETA 2 MICROGLOBULIN, SERUM: Beta-2 Microglobulin: 2.5 mg/L — ABNORMAL HIGH (ref 0.6–2.4)

## 2021-02-07 LAB — IGG, IGA, IGM
IgA: 27 mg/dL — ABNORMAL LOW (ref 61–437)
IgG (Immunoglobin G), Serum: 3116 mg/dL — ABNORMAL HIGH (ref 603–1613)
IgM (Immunoglobulin M), Srm: 14 mg/dL — ABNORMAL LOW (ref 15–143)

## 2021-02-08 LAB — PROTEIN ELECTROPHORESIS, SERUM
A/G Ratio: 0.9 (ref 0.7–1.7)
Albumin ELP: 3.9 g/dL (ref 2.9–4.4)
Alpha-1-Globulin: 0.2 g/dL (ref 0.0–0.4)
Alpha-2-Globulin: 0.7 g/dL (ref 0.4–1.0)
Beta Globulin: 0.8 g/dL (ref 0.7–1.3)
Gamma Globulin: 2.7 g/dL — ABNORMAL HIGH (ref 0.4–1.8)
Globulin, Total: 4.4 g/dL — ABNORMAL HIGH (ref 2.2–3.9)
M-Spike, %: 2.5 g/dL — ABNORMAL HIGH
Total Protein ELP: 8.3 g/dL (ref 6.0–8.5)

## 2021-02-10 DIAGNOSIS — M25551 Pain in right hip: Secondary | ICD-10-CM | POA: Insufficient documentation

## 2021-02-12 ENCOUNTER — Other Ambulatory Visit: Payer: Self-pay

## 2021-02-12 ENCOUNTER — Ambulatory Visit (HOSPITAL_COMMUNITY): Payer: Medicare Other | Attending: Cardiology

## 2021-02-12 DIAGNOSIS — R011 Cardiac murmur, unspecified: Secondary | ICD-10-CM | POA: Diagnosis present

## 2021-02-12 LAB — ECHOCARDIOGRAM COMPLETE
Area-P 1/2: 3.15 cm2
S' Lateral: 3.1 cm

## 2021-02-18 ENCOUNTER — Telehealth: Payer: Self-pay | Admitting: Internal Medicine

## 2021-02-18 NOTE — Telephone Encounter (Signed)
Judeen Hammans is calling requesting all the patient's CT's performed with our office since 2016. Best fax number to send them is [phone].

## 2021-05-26 ENCOUNTER — Ambulatory Visit: Payer: Medicare Other | Admitting: Internal Medicine

## 2021-05-26 DIAGNOSIS — I4510 Unspecified right bundle-branch block: Secondary | ICD-10-CM

## 2021-05-26 DIAGNOSIS — I10 Essential (primary) hypertension: Secondary | ICD-10-CM

## 2021-05-26 DIAGNOSIS — I2510 Atherosclerotic heart disease of native coronary artery without angina pectoris: Secondary | ICD-10-CM

## 2021-08-01 ENCOUNTER — Ambulatory Visit (INDEPENDENT_AMBULATORY_CARE_PROVIDER_SITE_OTHER): Payer: Medicare Other | Admitting: Internal Medicine

## 2021-08-01 ENCOUNTER — Other Ambulatory Visit: Payer: Self-pay

## 2021-08-01 ENCOUNTER — Encounter: Payer: Self-pay | Admitting: Internal Medicine

## 2021-08-01 VITALS — BP 114/66 | HR 79 | Ht 68.0 in | Wt 182.0 lb

## 2021-08-01 DIAGNOSIS — I712 Thoracic aortic aneurysm, without rupture, unspecified: Secondary | ICD-10-CM | POA: Diagnosis not present

## 2021-08-01 DIAGNOSIS — I4510 Unspecified right bundle-branch block: Secondary | ICD-10-CM

## 2021-08-01 MED ORDER — FUROSEMIDE 20 MG PO TABS: 20.0000 mg | ORAL_TABLET | ORAL | 3 refills | Status: DC

## 2021-08-01 MED ORDER — ASPIRIN EC 81 MG PO TBEC: 81.0000 mg | DELAYED_RELEASE_TABLET | Freq: Every day | ORAL | 3 refills | Status: DC

## 2021-08-01 NOTE — Patient Instructions (Signed)
Medication Instructions:  Your physician has recommended you make the following change in your medication:   ** Stop Maxzide  ** Begin Furosemide 20mg  - 1 tablet by mouth every other day  ** Resume Aspirin 81mg  - 1 tablet by mouth daily  *If you need a refill on your cardiac medications before your next appointment, please call your pharmacy*   Lab Work: None ordered.  If you have labs (blood work) drawn today and your tests are completely normal, you will receive your results only by: [place] (if you have MyChart) OR A paper copy in the mail If you have any lab test that is abnormal or we need to change your treatment, we will call you to review the results.   Testing/Procedures: None ordered.    Follow-Up: At [facility], you and your health needs are our priority.  As part of our continuing mission to provide you with exceptional heart care, we have created designated Provider Care Teams.  These Care Teams include your primary Cardiologist (physician) and Advanced Practice Providers (APPs -  Physician Assistants and Nurse Practitioners) who all work together to provide you with the care you need, when you need it.  We recommend signing up for the patient portal called "MyChart".  Sign up information is provided on this After Visit Summary.  MyChart is used to connect with patients for Virtual Visits (Telemedicine).  Patients are able to view lab/test results, encounter notes, upcoming appointments, etc.  Non-urgent messages can be sent to your provider as well.   To learn more about what you can do with MyChart, go to [email or web address].    Your next appointment:   12 months with Dr Caryl Comes

## 2021-08-01 NOTE — Progress Notes (Signed)
Patient Care Team: [name], MD as PCP - General (Family Medicine) Tish Men, MD (Inactive) as Medical Oncologist (Hematology)   HPI  [name] is a 76 y.o. male Seen in followup for a history of a catheterization in 2008 demonstrate severe LAD disease and moderate circumflex disease with an occluded posterolateral branch. He had normal LV function with perfusion abnormality on Myoview scan is wanting to that distribution.  He underwent DES stenting  He also has a thoracic aortic aneurysm  Intercurrently diagnosed with monoclonal gammopathy -- no therapy indicated,    DATE TEST   9/16 CTA 4.3 cm   3/17 CTA 4.3 cm  5/18 CTA 4.3 cm  9/19 CTA 4.3 cm  1/22 CTA 4.3 cm        Date Cr K Hgb  7/21 1.11 3.7 13.2   8/22 1.01 3.6 11.7   DATE TEST EF   2013 Echo   55-60 %   9/19* Myoview  52 % No ischemia/Infarct  12/21 Echo 60-65 %   8/22 Echo 60-65 % 4.5 cm    Overall he is feeling alright. While walking, he states that the severity of his shortness of breath depends on the steepness of inclines. He denies any chest tightness or pain while walking on inclines or flat ground.   He endorses peripheral edema at times, especially during hot weather.  The patient denies nocturnal dyspnea, or orthopnea.  There have been no palpitations, lightheadedness or syncope.    For activity, he continues to enjoy playing golf.t.  His cholesterol has been followed by his PCP.  Past Medical History:  Diagnosis Date   Anemia    BPH (benign prostatic hyperplasia)    CAD (coronary artery disease)    Collagen vascular disease (HCC)    Colon polyp    Diabetes mellitus without complication ([place])    Esophageal reflux    Fe deficiency anemia 2012   FHx: colonic polyps 11/04, 01/2009   Gastritis    Hearing loss    wears hearing aid   Heart attack ([place])    Hemorrhoids, internal    Hyperlipidemia    Hypertension    Hypogonadism male    Smoldering myeloma    Smoldering  myeloma    Wears glasses     Past Surgical History:  Procedure Laterality Date   APPENDECTOMY     CORONARY ANGIOPLASTY WITH STENT PLACEMENT  2007   HAND SURGERY     left 2010, right 2012   [place]   left   left knee     x3   left shoulder     PROSTATE SURGERY     Right CTS     ?   ROTATOR CUFF REPAIR Right 12/2012   TOTAL KNEE ARTHROPLASTY Right 03/08/2015   Procedure: RIGHT TOTAL KNEE ARTHROPLASTY;  Surgeon: Sydnee Cabal, MD;  Location: [facility];  Service: Orthopedics;  Laterality: Right;   VASECTOMY      Current Outpatient Medications  Medication Sig Dispense Refill   amLODipine (NORVASC) 10 MG tablet Take 10 mg by mouth every morning.     atorvastatin (LIPITOR) 80 MG tablet Take 80 mg by mouth daily.     cyclobenzaprine (FLEXERIL) 5 MG tablet Take 5-10 mg by mouth at bedtime as needed.     ezetimibe (ZETIA) 10 MG tablet Take 10 mg by mouth daily.     metFORMIN (GLUCOPHAGE-XR) 500 MG 24 hr tablet Take 4 tablets by mouth 2 (  two) times daily.     omeprazole (PRILOSEC) 20 MG capsule Take 20 mg by mouth 2 (two) times daily before a meal.      rosuvastatin (CRESTOR) 40 MG tablet Take 40 mg by mouth daily.     Testosterone 25 MG/2.5GM (1%) GEL Place 1 mg onto the skin every other day.     triamterene-hydrochlorothiazide (MAXZIDE-25) 37.5-25 MG tablet Take 1 tablet by mouth daily.     TRULICITY 1.54 MG/8.6PY SOPN Inject into the skin.     valsartan (DIOVAN) 320 MG tablet Take 320 mg by mouth daily.     tadalafil (CIALIS) 20 MG tablet Take 1/2-1 tablet (10-20 mg) daily as needed     No current facility-administered medications for this visit.    Allergies  Allergen Reactions   Clindamycin Hcl Itching   Metoprolol Succinate Other (See Comments)    UNKNOWN REACTION    Review of Systems negative except from HPI and PMH  Physical Exam BP 114/66 (BP Location: Right Arm, Patient Position: Sitting, Cuff Size: Normal)    Pulse 79    Ht 5\' 8"  (1.727 m)    Wt 182 lb (82.6  kg)    BMI 27.67 kg/m  Well developed and nourished in no acute distress HENT normal Neck supple with JVP-  flat   Clear Regular rate and rhythm, no murmurs or gallops Abd-soft with active BS No Clubbing cyanosis edema Skin-warm and dry A & Oriented  Grossly normal sensory and motor function     ECG sinus at 73 Intervals 21/15/40 Right bundle branch block left axis deviation unchanged from 10/20   Assessment and  Plan  CAD  S/p stenting  Ascending aortic aneurysm   Hypertension  Monoclonal Gammopathy  RBBB/left axis deviation Blood pressure well controlled.  We will continue him on amlodipine 10 mg daily and valsartan 320.  We will discontinue his Maxide and try him on furosemide 20 mg every other day as he has dyspnea and is E/E' was greater than 15 on his last echo.  There may also be ischemia as a cause of his dyspnea; if the furosemide does not ameliorate his symptoms, we will try him on isosorbide.  His aneurysm is stable.  Continue his current medications.  No symptoms of ischemia.will resume ASA 81    I,Mathew Stumpf,acting as a scribe for Virl Axe, MD.,have documented all relevant documentation on the behalf of Virl Axe, MD,as directed by  Virl Axe, MD while in the presence of Virl Axe, MD.  I, Virl Axe, MD, have reviewed all documentation for this visit. The documentation on 08/01/21 for the exam, diagnosis, procedures, and orders are all accurate and complete.

## 2021-08-08 ENCOUNTER — Inpatient Hospital Stay: Payer: Medicare Other | Attending: Hematology & Oncology

## 2021-08-08 ENCOUNTER — Other Ambulatory Visit: Payer: Self-pay

## 2021-08-08 ENCOUNTER — Encounter: Payer: Self-pay | Admitting: Hematology & Oncology

## 2021-08-08 ENCOUNTER — Inpatient Hospital Stay (HOSPITAL_BASED_OUTPATIENT_CLINIC_OR_DEPARTMENT_OTHER): Payer: Medicare Other | Admitting: Hematology & Oncology

## 2021-08-08 VITALS — BP 130/83 | HR 65 | Temp 98.1°F | Resp 17 | Wt 185.0 lb

## 2021-08-08 DIAGNOSIS — Z7982 Long term (current) use of aspirin: Secondary | ICD-10-CM | POA: Diagnosis not present

## 2021-08-08 DIAGNOSIS — D72819 Decreased white blood cell count, unspecified: Secondary | ICD-10-CM | POA: Insufficient documentation

## 2021-08-08 DIAGNOSIS — K429 Umbilical hernia without obstruction or gangrene: Secondary | ICD-10-CM | POA: Diagnosis not present

## 2021-08-08 DIAGNOSIS — D472 Monoclonal gammopathy: Secondary | ICD-10-CM

## 2021-08-08 DIAGNOSIS — N2889 Other specified disorders of kidney and ureter: Secondary | ICD-10-CM | POA: Insufficient documentation

## 2021-08-08 DIAGNOSIS — D509 Iron deficiency anemia, unspecified: Secondary | ICD-10-CM | POA: Diagnosis not present

## 2021-08-08 DIAGNOSIS — D500 Iron deficiency anemia secondary to blood loss (chronic): Secondary | ICD-10-CM

## 2021-08-08 DIAGNOSIS — C9000 Multiple myeloma not having achieved remission: Secondary | ICD-10-CM | POA: Diagnosis not present

## 2021-08-08 DIAGNOSIS — Z79899 Other long term (current) drug therapy: Secondary | ICD-10-CM | POA: Diagnosis not present

## 2021-08-08 LAB — CMP (CANCER CENTER ONLY)
ALT: 18 U/L (ref 0–44)
AST: 20 U/L (ref 15–41)
Albumin: 3.7 g/dL (ref 3.5–5.0)
Alkaline Phosphatase: 43 U/L (ref 38–126)
Anion gap: 7 (ref 5–15)
BUN: 15 mg/dL (ref 8–23)
CO2: 29 mmol/L (ref 22–32)
Calcium: 9 mg/dL (ref 8.9–10.3)
Chloride: 100 mmol/L (ref 98–111)
Creatinine: 1.03 mg/dL (ref 0.61–1.24)
GFR, Estimated: >60 mL/min (ref >60)
Glucose, Bld: 175 mg/dL — ABNORMAL HIGH (ref 70–99)
Potassium: 3.6 mmol/L (ref 3.5–5.1)
Sodium: 136 mmol/L (ref 135–145)
Total Bilirubin: 0.4 mg/dL (ref 0.3–1.2)
Total Protein: 8.5 g/dL — ABNORMAL HIGH (ref 6.5–8.1)

## 2021-08-08 LAB — CBC WITH DIFFERENTIAL (CANCER CENTER ONLY)
Abs Immature Granulocytes: 0 10*3/uL (ref 0.00–0.07)
Basophils Absolute: 0 10*3/uL (ref 0.0–0.1)
Basophils Relative: 1 %
Eosinophils Absolute: 0.1 10*3/uL (ref 0.0–0.5)
Eosinophils Relative: 3 %
HCT: 33.7 % — ABNORMAL LOW (ref 39.0–52.0)
Hemoglobin: 11.3 g/dL — ABNORMAL LOW (ref 13.0–17.0)
Immature Granulocytes: 0 %
Lymphocytes Relative: 33 %
Lymphs Abs: 1.1 10*3/uL (ref 0.7–4.0)
MCH: 30.8 pg (ref 26.0–34.0)
MCHC: 33.5 g/dL (ref 30.0–36.0)
MCV: 91.8 fL (ref 80.0–100.0)
Monocytes Absolute: 0.3 10*3/uL (ref 0.1–1.0)
Monocytes Relative: 8 %
Neutro Abs: 1.9 10*3/uL (ref 1.7–7.7)
Neutrophils Relative %: 55 %
Platelet Count: 226 10*3/uL (ref 150–400)
RBC: 3.67 MIL/uL — ABNORMAL LOW (ref 4.22–5.81)
RDW: 12.6 % (ref 11.5–15.5)
WBC Count: 3.3 10*3/uL — ABNORMAL LOW (ref 4.0–10.5)
nRBC: 0 % (ref 0.0–0.2)

## 2021-08-08 LAB — LACTATE DEHYDROGENASE: LDH: 132 U/L (ref 98–192)

## 2021-08-08 LAB — IRON AND IRON BINDING CAPACITY (CC-WL,HP ONLY)
Iron: 90 ug/dL (ref 45–182)
Saturation Ratios: 21 % (ref 17.9–39.5)
TIBC: 420 ug/dL (ref 250–450)
UIBC: 330 ug/dL (ref 117–376)

## 2021-08-08 LAB — FERRITIN: Ferritin: 31 ng/mL (ref 24–336)

## 2021-08-08 NOTE — Progress Notes (Signed)
Hematology and Oncology Follow Up Visit  [name] [id number] [birth year] 76 y.o. 08/08/2021   Principle Diagnosis:  Smoldering IgG kappa multiple myeloma, high risk  Incidental exophytic lesion of R kidney Intermittent iron deficiency anemia   Current Therapy:   IV iron as indicated  On observation     Interim History:  [name] is here today for follow-up.  We last saw him 6 months ago.  He is doing okay.  Unfortunately, his sister is not doing well with her Alzheimer's.  He did make it through all of the holidays.  I am happy about this.  He got through and did well.  As far as the smoldering IgG kappa myeloma is concerned this been holding pretty steady.  Back in August, his monoclonal spike was 2.5 g/dL.  His IgG level was is been 8 months since we last saw him.  Unfortunate, he has had to take care of his older sister who has Alzheimer's.  I really applaud him for doing this.  Otherwise he is doing quite well.  He really has had no complaints.  The last time I saw him back in November we had to do an MRI because of this kidney lesion on the right kidney.  Thankfully, the MRI did not show anything that looked suspicious for malignancy.  As far as his smoldering myeloma goes, back in February, his M spike was 2.6 g/dL.  His IgG level was 3100 mg/dL.  The Kappa light chain was 16.7 mg/dL.  He has had no problems with bony pain.  He has had no issues with nausea or vomiting.  He has had no infections.  He has had no problems with COVID.  He does have an umbilical hernia.  He was wondering if this needed to be fixed.  I looked at it, and I do not think that needs to be repaired.  Overall, his performance status is ECOG 1.    Medications:  Allergies as of 08/08/2021       Reactions   Clindamycin Hcl Itching   Metoprolol Succinate Other (See Comments)   UNKNOWN REACTION        Medication List        Accurate as of August 08, 2021  8:48 AM. If you have any  questions, ask your nurse or doctor.          amLODipine 10 MG tablet Commonly known as: NORVASC Take 10 mg by mouth every morning.   aspirin EC 81 MG tablet Take 1 tablet (81 mg total) by mouth daily. Swallow whole.   atorvastatin 80 MG tablet Commonly known as: LIPITOR Take 80 mg by mouth daily.   cyclobenzaprine 5 MG tablet Commonly known as: FLEXERIL Take 5-10 mg by mouth at bedtime as needed.   ezetimibe 10 MG tablet Commonly known as: ZETIA Take 10 mg by mouth daily.   furosemide 20 MG tablet Commonly known as: LASIX Take 1 tablet (20 mg total) by mouth every other day.   metFORMIN 500 MG 24 hr tablet Commonly known as: GLUCOPHAGE-XR Take 4 tablets by mouth 2 (two) times daily.   omeprazole 20 MG capsule Commonly known as: PRILOSEC Take 20 mg by mouth 2 (two) times daily before a meal.   rosuvastatin 40 MG tablet Commonly known as: CRESTOR Take 40 mg by mouth daily.   tadalafil 20 MG tablet Commonly known as: CIALIS Take 1/2-1 tablet (10-20 mg) daily as needed   Testosterone 25 MG/2.5GM (1%) Gel Place 1 mg  onto the skin every other day.   Trulicity 3.81 WE/9.9BZ Sopn Generic drug: Dulaglutide Inject into the skin.   valsartan 320 MG tablet Commonly known as: DIOVAN Take 320 mg by mouth daily.        Allergies:  Allergies  Allergen Reactions   Clindamycin Hcl Itching   Metoprolol Succinate Other (See Comments)    UNKNOWN REACTION    Past Medical History, Surgical history, Social history, and Family History were reviewed and updated.  Review of Systems: Review of Systems  Constitutional: Negative.   HENT: Negative.    Eyes: Negative.   Respiratory: Negative.    Cardiovascular: Negative.   Gastrointestinal: Negative.   Genitourinary: Negative.   Musculoskeletal: Negative.   Skin: Negative.   Neurological: Negative.   Endo/Heme/Allergies: Negative.   Psychiatric/Behavioral: Negative.       Physical Exam:  weight is 185 lb (83.9  kg). His oral temperature is 98.1 F (36.7 C). His blood pressure is 130/83 and his pulse is 65. His respiration is 17 and oxygen saturation is 100%.   Wt Readings from Last 3 Encounters:  08/08/21 185 lb (83.9 kg)  08/01/21 182 lb (82.6 kg)  02/06/21 183 lb (83 kg)    Physical Exam Vitals reviewed.  HENT:     Head: Normocephalic and atraumatic.  Eyes:     Pupils: Pupils are equal, round, and reactive to light.  Cardiovascular:     Rate and Rhythm: Normal rate and regular rhythm.     Heart sounds: Normal heart sounds.  Pulmonary:     Effort: Pulmonary effort is normal.     Breath sounds: Normal breath sounds.  Abdominal:     General: Bowel sounds are normal.     Palpations: Abdomen is soft.  Musculoskeletal:        General: No tenderness or deformity. Normal range of motion.     Cervical back: Normal range of motion.  Lymphadenopathy:     Cervical: No cervical adenopathy.  Skin:    General: Skin is warm and dry.     Findings: No erythema or rash.  Neurological:     Mental Status: He is alert and oriented to person, place, and time.  Psychiatric:        Behavior: Behavior normal.        Thought Content: Thought content normal.        Judgment: Judgment normal.     Lab Results  Component Value Date   WBC 3.3 (L) 08/08/2021   HGB 11.3 (L) 08/08/2021   HCT 33.7 (L) 08/08/2021   MCV 91.8 08/08/2021   PLT 226 08/08/2021   Lab Results  Component Value Date   FERRITIN 10 (L) 02/03/2019   IRON 36 (L) 02/03/2019   TIBC 466 (H) 02/03/2019   UIBC 431 (H) 02/03/2019   IRONPCTSAT 8 (L) 02/03/2019   Lab Results  Component Value Date   RETICCTPCT 2.1 12/10/2006   RBC 3.67 (L) 08/08/2021   Lab Results  Component Value Date   KPAFRELGTCHN 167.1 (H) 02/06/2021   LAMBDASER 7.0 02/06/2021   KAPLAMBRATIO 23.87 (H) 02/06/2021   Lab Results  Component Value Date   IGGSERUM 3,116 (H) 02/06/2021   IGA 27 (L) 02/06/2021   IGMSERUM 14 (L) 02/06/2021   Lab Results   Component Value Date   TOTALPROTELP 8.3 02/06/2021   ALBUMINELP 3.9 02/06/2021   A1GS 0.2 02/06/2021   A2GS 0.7 02/06/2021   BETS 0.8 02/06/2021   GAMS 2.7 (H) 02/06/2021   MSPIKE  2.5 (H) 02/06/2021   SPEI Comment 02/06/2021     Chemistry      Component Value Date/Time   NA 136 08/08/2021 0803   NA 139 06/20/2020 0923   K 3.6 08/08/2021 0803   CL 100 08/08/2021 0803   CO2 29 08/08/2021 0803   BUN 15 08/08/2021 0803   BUN 17 06/20/2020 0923   CREATININE 1.03 08/08/2021 0803   CREATININE 1.12 09/03/2015 1201      Component Value Date/Time   CALCIUM 9.0 08/08/2021 0803   ALKPHOS 43 08/08/2021 0803   AST 20 08/08/2021 0803   ALT 18 08/08/2021 0803   BILITOT 0.4 08/08/2021 0803       Impression and Plan: Mr. Mezera is a very pleasant 76 yo caucasian gentleman with smoldering IgG kappa multiple myeloma.  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  We will have to see what his myeloma studies are right now.  I must say that his myeloma levels at the last time that we saw him actually were better.  He has some chronic leukopenia and anemia which is also has been holding pretty steady.  I know that the priority is to try to help his sister with the Alzheimer's.  We will try not to interfere with this.  We will try to get him back to see [place] sometime in the summer.    Volanda Napoleon, MD 2/10/20238:48 AM

## 2021-08-09 LAB — IGG, IGA, IGM
IgA: 24 mg/dL — ABNORMAL LOW (ref 61–437)
IgG (Immunoglobin G), Serum: 3329 mg/dL — ABNORMAL HIGH (ref 603–1613)
IgM (Immunoglobulin M), Srm: 16 mg/dL (ref 15–143)

## 2021-08-11 LAB — KAPPA/LAMBDA LIGHT CHAINS
Kappa free light chain: 186.7 mg/L — ABNORMAL HIGH (ref 3.3–19.4)
Kappa, lambda light chain ratio: 20.29 — ABNORMAL HIGH (ref 0.26–1.65)
Lambda free light chains: 9.2 mg/L (ref 5.7–26.3)

## 2021-08-13 LAB — PROTEIN ELECTROPHORESIS, SERUM, WITH REFLEX
A/G Ratio: 0.8 (ref 0.7–1.7)
Albumin ELP: 3.8 g/dL (ref 2.9–4.4)
Alpha-1-Globulin: 0.2 g/dL (ref 0.0–0.4)
Alpha-2-Globulin: 0.7 g/dL (ref 0.4–1.0)
Beta Globulin: 0.9 g/dL (ref 0.7–1.3)
Gamma Globulin: 2.7 g/dL — ABNORMAL HIGH (ref 0.4–1.8)
Globulin, Total: 4.5 g/dL — ABNORMAL HIGH (ref 2.2–3.9)
M-Spike, %: 2.6 g/dL — ABNORMAL HIGH
SPEP Interpretation: 0
Total Protein ELP: 8.3 g/dL (ref 6.0–8.5)

## 2021-08-13 LAB — IMMUNOFIXATION REFLEX, SERUM
IgA: 25 mg/dL — ABNORMAL LOW (ref 61–437)
IgG (Immunoglobin G), Serum: 3979 mg/dL — ABNORMAL HIGH (ref 603–1613)
IgM (Immunoglobulin M), Srm: 18 mg/dL (ref 15–143)

## 2021-09-05 ENCOUNTER — Other Ambulatory Visit: Payer: Self-pay | Admitting: Family Medicine

## 2021-09-05 DIAGNOSIS — I712 Thoracic aortic aneurysm, without rupture, unspecified: Secondary | ICD-10-CM

## 2021-09-29 ENCOUNTER — Ambulatory Visit
Admission: RE | Admit: 2021-09-29 | Discharge: 2021-09-29 | Disposition: A | Discharge status: Home / Self Care | Payer: Medicare Other | Source: Ambulatory Visit | Attending: Family Medicine | Admitting: Family Medicine

## 2021-09-29 DIAGNOSIS — I712 Thoracic aortic aneurysm, without rupture, unspecified: Secondary | ICD-10-CM

## 2021-09-29 MED ORDER — IOPAMIDOL (ISOVUE-370) INJECTION 76%
75.0000 mL | Freq: Once | INTRAVENOUS | Status: AC | PRN
  Administered 2021-09-29: 75 mL via INTRAVENOUS

## 2021-10-01 DIAGNOSIS — M65312 Trigger thumb, left thumb: Secondary | ICD-10-CM | POA: Insufficient documentation

## 2021-12-12 ENCOUNTER — Other Ambulatory Visit: Payer: Self-pay

## 2021-12-12 ENCOUNTER — Encounter: Payer: Self-pay | Admitting: Hematology & Oncology

## 2021-12-12 ENCOUNTER — Inpatient Hospital Stay: Payer: Medicare Other | Attending: Hematology & Oncology

## 2021-12-12 ENCOUNTER — Other Ambulatory Visit: Payer: Self-pay | Admitting: Oncology

## 2021-12-12 ENCOUNTER — Inpatient Hospital Stay (HOSPITAL_BASED_OUTPATIENT_CLINIC_OR_DEPARTMENT_OTHER): Payer: Medicare Other | Admitting: Hematology & Oncology

## 2021-12-12 ENCOUNTER — Other Ambulatory Visit: Payer: Self-pay | Admitting: *Deleted

## 2021-12-12 VITALS — BP 143/71 | HR 58 | Temp 97.4°F | Resp 16 | Wt 189.0 lb

## 2021-12-12 DIAGNOSIS — R790 Abnormal level of blood mineral: Secondary | ICD-10-CM

## 2021-12-12 DIAGNOSIS — Z79899 Other long term (current) drug therapy: Secondary | ICD-10-CM | POA: Diagnosis not present

## 2021-12-12 DIAGNOSIS — D472 Monoclonal gammopathy: Secondary | ICD-10-CM | POA: Diagnosis present

## 2021-12-12 LAB — RETICULOCYTES
Immature Retic Fract: 23 % — ABNORMAL HIGH (ref 2.3–15.9)
RBC.: 3.47 MIL/uL — ABNORMAL LOW (ref 4.22–5.81)
Retic Count, Absolute: 56.9 10*3/uL (ref 19.0–186.0)
Retic Ct Pct: 1.6 % (ref 0.4–3.1)

## 2021-12-12 LAB — CBC WITH DIFFERENTIAL (CANCER CENTER ONLY)
Abs Immature Granulocytes: 0.01 10*3/uL (ref 0.00–0.07)
Basophils Absolute: 0 10*3/uL (ref 0.0–0.1)
Basophils Relative: 1 %
Eosinophils Absolute: 0.1 10*3/uL (ref 0.0–0.5)
Eosinophils Relative: 2 %
HCT: 30.9 % — ABNORMAL LOW (ref 39.0–52.0)
Hemoglobin: 9.9 g/dL — ABNORMAL LOW (ref 13.0–17.0)
Immature Granulocytes: 0 %
Lymphocytes Relative: 40 %
Lymphs Abs: 1.3 10*3/uL (ref 0.7–4.0)
MCH: 28.4 pg (ref 26.0–34.0)
MCHC: 32 g/dL (ref 30.0–36.0)
MCV: 88.5 fL (ref 80.0–100.0)
Monocytes Absolute: 0.3 10*3/uL (ref 0.1–1.0)
Monocytes Relative: 9 %
Neutro Abs: 1.5 10*3/uL — ABNORMAL LOW (ref 1.7–7.7)
Neutrophils Relative %: 48 %
Platelet Count: 199 10*3/uL (ref 150–400)
RBC: 3.49 MIL/uL — ABNORMAL LOW (ref 4.22–5.81)
RDW: 13 % (ref 11.5–15.5)
WBC Count: 3.2 10*3/uL — ABNORMAL LOW (ref 4.0–10.5)
nRBC: 0 % (ref 0.0–0.2)

## 2021-12-12 LAB — CMP (CANCER CENTER ONLY)
ALT: 16 U/L (ref 0–44)
AST: 16 U/L (ref 15–41)
Albumin: 3.8 g/dL (ref 3.5–5.0)
Alkaline Phosphatase: 35 U/L — ABNORMAL LOW (ref 38–126)
Anion gap: 7 (ref 5–15)
BUN: 17 mg/dL (ref 8–23)
CO2: 28 mmol/L (ref 22–32)
Calcium: 9.4 mg/dL (ref 8.9–10.3)
Chloride: 101 mmol/L (ref 98–111)
Creatinine: 0.8 mg/dL (ref 0.61–1.24)
GFR, Estimated: >60 mL/min (ref >60)
Glucose, Bld: 197 mg/dL — ABNORMAL HIGH (ref 70–99)
Potassium: 3.6 mmol/L (ref 3.5–5.1)
Sodium: 136 mmol/L (ref 135–145)
Total Bilirubin: 0.4 mg/dL (ref 0.3–1.2)
Total Protein: 8.5 g/dL — ABNORMAL HIGH (ref 6.5–8.1)

## 2021-12-12 LAB — IRON AND IRON BINDING CAPACITY (CC-WL,HP ONLY)
Iron: 60 ug/dL (ref 45–182)
Saturation Ratios: 12 % — ABNORMAL LOW (ref 17.9–39.5)
TIBC: 514 ug/dL — ABNORMAL HIGH (ref 250–450)
UIBC: 454 ug/dL — ABNORMAL HIGH (ref 117–376)

## 2021-12-12 LAB — LACTATE DEHYDROGENASE: LDH: 123 U/L (ref 98–192)

## 2021-12-12 LAB — FERRITIN: Ferritin: 6 ng/mL — ABNORMAL LOW (ref 24–336)

## 2021-12-12 NOTE — Progress Notes (Signed)
Hematology and Oncology Follow Up Visit  [name] [id number] 1946-06-11 76 y.o. 12/12/2021   Principle Diagnosis:  Smoldering IgG kappa multiple myeloma, high risk  Incidental exophytic lesion of R kidney Intermittent iron deficiency anemia   Current Therapy:   IV iron as indicated  On observation     Interim History:  [name] is here today for follow-up.  We last saw him back in February.  Since then, he is doing okay himself.  Unfortunately, he has a sister who has Alzheimer's.  She is having a little bit of a tough time.  We are following his IgG kappa smoldering myeloma.  When we last saw him back in February, his monoclonal spike was 2.6 g/dL.  His IgG level was 3980 mg/dL.  The Kappa light chain was 19 mg/dL.  He is now complaining of any kind of bony pain.  He is having no problems with nausea or vomiting.  He has had no problem with infections.  He has had no obvious change in bowel or bladder habits.  There has been no bleeding.  He has had no rashes.  Overall, I would say his performance status is probably ECOG 1.     Medications:  Allergies as of 12/12/2021       Reactions   Clindamycin Hcl Itching   Metoprolol Succinate Other (See Comments)   UNKNOWN REACTION        Medication List        Accurate as of December 12, 2021  8:09 AM. If you have any questions, ask your nurse or doctor.          STOP taking these medications    cyclobenzaprine 5 MG tablet Commonly known as: FLEXERIL Stopped by: Volanda Napoleon, MD   tadalafil 20 MG tablet Commonly known as: CIALIS Stopped by: Volanda Napoleon, MD       TAKE these medications    amLODipine 10 MG tablet Commonly known as: NORVASC Take 10 mg by mouth every morning.   aspirin EC 81 MG tablet Take 1 tablet (81 mg total) by mouth daily. Swallow whole.   atorvastatin 80 MG tablet Commonly known as: LIPITOR Take 80 mg by mouth daily.   ezetimibe 10 MG tablet Commonly known as: ZETIA Take 10  mg by mouth daily.   furosemide 20 MG tablet Commonly known as: LASIX Take 1 tablet (20 mg total) by mouth every other day.   glipiZIDE 2.5 MG 24 hr tablet Commonly known as: GLUCOTROL XL Take 2.5 mg by mouth every morning.   metFORMIN 500 MG 24 hr tablet Commonly known as: GLUCOPHAGE-XR Take 4 tablets by mouth 2 (two) times daily.   omeprazole 20 MG capsule Commonly known as: PRILOSEC Take 20 mg by mouth 2 (two) times daily before a meal.   rosuvastatin 40 MG tablet Commonly known as: CRESTOR Take 40 mg by mouth daily.   Testosterone 25 MG/2.5GM (1%) Gel Place 1 mg onto the skin every other day.   triamterene-hydrochlorothiazide 37.5-25 MG tablet Commonly known as: MAXZIDE-25 Take 1 tablet by mouth every morning.   Trulicity 1.5 EK/8.0KL Sopn Generic drug: Dulaglutide Inject 1.5 mg into the skin once a week. What changed: Another medication with the same name was removed. Continue taking this medication, and follow the directions you see here. Changed by: Volanda Napoleon, MD   valsartan 320 MG tablet Commonly known as: DIOVAN Take 320 mg by mouth daily.        Allergies:  Allergies  Allergen Reactions   Clindamycin Hcl Itching   Metoprolol Succinate Other (See Comments)    UNKNOWN REACTION    Past Medical History, Surgical history, Social history, and Family History were reviewed and updated.  Review of Systems: Review of Systems  Constitutional: Negative.   HENT: Negative.    Eyes: Negative.   Respiratory: Negative.    Cardiovascular: Negative.   Gastrointestinal: Negative.   Genitourinary: Negative.   Musculoskeletal: Negative.   Skin: Negative.   Neurological: Negative.   Endo/Heme/Allergies: Negative.   Psychiatric/Behavioral: Negative.        Physical Exam:  weight is 189 lb (85.7 kg). His oral temperature is 97.4 F (36.3 C) (abnormal). His blood pressure is 143/71 (abnormal) and his pulse is 58 (abnormal). His respiration is 16 and  oxygen saturation is 97%.   Wt Readings from Last 3 Encounters:  12/12/21 189 lb (85.7 kg)  08/08/21 185 lb (83.9 kg)  08/01/21 182 lb (82.6 kg)    Physical Exam Vitals reviewed.  HENT:     Head: Normocephalic and atraumatic.  Eyes:     Pupils: Pupils are equal, round, and reactive to light.  Cardiovascular:     Rate and Rhythm: Normal rate and regular rhythm.     Heart sounds: Normal heart sounds.  Pulmonary:     Effort: Pulmonary effort is normal.     Breath sounds: Normal breath sounds.  Abdominal:     General: Bowel sounds are normal.     Palpations: Abdomen is soft.  Musculoskeletal:        General: No tenderness or deformity. Normal range of motion.     Cervical back: Normal range of motion.  Lymphadenopathy:     Cervical: No cervical adenopathy.  Skin:    General: Skin is warm and dry.     Findings: No erythema or rash.  Neurological:     Mental Status: He is alert and oriented to person, place, and time.  Psychiatric:        Behavior: Behavior normal.        Thought Content: Thought content normal.        Judgment: Judgment normal.     Lab Results  Component Value Date   WBC 3.2 (L) 12/12/2021   HGB 9.9 (L) 12/12/2021   HCT 30.9 (L) 12/12/2021   MCV 88.5 12/12/2021   PLT 199 12/12/2021   Lab Results  Component Value Date   FERRITIN 31 08/08/2021   IRON 90 08/08/2021   TIBC 420 08/08/2021   UIBC 330 08/08/2021   IRONPCTSAT 21 08/08/2021   Lab Results  Component Value Date   RETICCTPCT 2.1 12/10/2006   RBC 3.49 (L) 12/12/2021   Lab Results  Component Value Date   KPAFRELGTCHN 186.7 (H) 08/08/2021   LAMBDASER 9.2 08/08/2021   KAPLAMBRATIO 20.29 (H) 08/08/2021   Lab Results  Component Value Date   IGGSERUM 3,329 (H) 08/08/2021   IGGSERUM 3,979 (H) 08/08/2021   IGA 24 (L) 08/08/2021   IGA 25 (L) 08/08/2021   IGMSERUM 16 08/08/2021   IGMSERUM 18 08/08/2021   Lab Results  Component Value Date   TOTALPROTELP 8.3 08/08/2021   ALBUMINELP  3.8 08/08/2021   A1GS 0.2 08/08/2021   A2GS 0.7 08/08/2021   BETS 0.9 08/08/2021   GAMS 2.7 (H) 08/08/2021   MSPIKE 2.6 (H) 08/08/2021   SPEI Comment 02/06/2021     Chemistry      Component Value Date/Time   NA 136 08/08/2021 0803   NA 139  06/20/2020 0923   K 3.6 08/08/2021 0803   CL 100 08/08/2021 0803   CO2 29 08/08/2021 0803   BUN 15 08/08/2021 0803   BUN 17 06/20/2020 0923   CREATININE 1.03 08/08/2021 0803   CREATININE 1.12 09/03/2015 1201      Component Value Date/Time   CALCIUM 9.0 08/08/2021 0803   ALKPHOS 43 08/08/2021 0803   AST 20 08/08/2021 0803   ALT 18 08/08/2021 0803   BILITOT 0.4 08/08/2021 0803       Impression and Plan: [name] is a very pleasant 76 yo caucasian gentleman with smoldering IgG kappa multiple myeloma.  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  We will have to see what his myeloma studies are right now.  So far, I am not seeing that we have had to treat him.  Again we will see what the monoclonal spike shows.  I know that his IgG level was not as bad.  Today, the IgG level was 3330 mg/dL.  At some point, I am sure we will have to treat him.  His hemoglobin is lower.  We did check iron studies on him.  He is iron deficient.  His ferritin is only 6 with an iron saturation of 12%.  We will have to see about giving him some IV iron.  I am unsure as to why he would be iron deficient.  His last colonoscopy was 4 years ago.  This shows some polyps and some diverticulosis.  His last upper endoscopy was 11 years ago.  Again, we will go ahead and get him back sooner so he can follow-up with the anemia and the iron deficiency.  Volanda Napoleon, MD 6/16/20238:09 AM

## 2021-12-13 LAB — ERYTHROPOIETIN: Erythropoietin: 206 m[IU]/mL — ABNORMAL HIGH (ref 2.6–18.5)

## 2021-12-14 LAB — IGG, IGA, IGM
IgA: 26 mg/dL — ABNORMAL LOW (ref 61–437)
IgG (Immunoglobin G), Serum: 3328 mg/dL — ABNORMAL HIGH (ref 603–1613)
IgM (Immunoglobulin M), Srm: 16 mg/dL (ref 15–143)

## 2021-12-15 LAB — KAPPA/LAMBDA LIGHT CHAINS
Kappa free light chain: 190.1 mg/L — ABNORMAL HIGH (ref 3.3–19.4)
Kappa, lambda light chain ratio: 29.7 — ABNORMAL HIGH (ref 0.26–1.65)
Lambda free light chains: 6.4 mg/L (ref 5.7–26.3)

## 2021-12-17 ENCOUNTER — Telehealth: Payer: Self-pay | Admitting: *Deleted

## 2021-12-17 ENCOUNTER — Encounter: Payer: Self-pay | Admitting: Hematology

## 2021-12-17 LAB — PROTEIN ELECTROPHORESIS, SERUM, WITH REFLEX
A/G Ratio: 0.8 (ref 0.7–1.7)
Albumin ELP: 3.5 g/dL (ref 2.9–4.4)
Alpha-1-Globulin: 0.2 g/dL (ref 0.0–0.4)
Alpha-2-Globulin: 0.7 g/dL (ref 0.4–1.0)
Beta Globulin: 0.9 g/dL (ref 0.7–1.3)
Gamma Globulin: 2.6 g/dL — ABNORMAL HIGH (ref 0.4–1.8)
Globulin, Total: 4.4 g/dL — ABNORMAL HIGH (ref 2.2–3.9)
M-Spike, %: 2.4 g/dL — ABNORMAL HIGH
SPEP Interpretation: 0
Total Protein ELP: 7.9 g/dL (ref 6.0–8.5)

## 2021-12-17 LAB — IMMUNOFIXATION REFLEX, SERUM
IgA: 30 mg/dL — ABNORMAL LOW (ref 61–437)
IgG (Immunoglobin G), Serum: 4039 mg/dL — ABNORMAL HIGH (ref 603–1613)
IgM (Immunoglobulin M), Srm: 16 mg/dL (ref 15–143)

## 2021-12-17 NOTE — Telephone Encounter (Signed)
Per scheduling message [place] per Dr. Marin Olp  (2) doses of IV Iron - called and confirmed

## 2021-12-22 ENCOUNTER — Inpatient Hospital Stay: Payer: Medicare Other

## 2021-12-22 ENCOUNTER — Other Ambulatory Visit: Payer: Self-pay | Admitting: Family

## 2021-12-22 VITALS — BP 122/58 | HR 72 | Temp 97.5°F | Resp 17

## 2021-12-22 DIAGNOSIS — D472 Monoclonal gammopathy: Secondary | ICD-10-CM | POA: Diagnosis not present

## 2021-12-22 DIAGNOSIS — D649 Anemia, unspecified: Secondary | ICD-10-CM

## 2021-12-22 MED ORDER — SODIUM CHLORIDE 0.9 % IV SOLN: INTRAVENOUS | Status: DC

## 2021-12-22 MED ORDER — SODIUM CHLORIDE 0.9 % IV SOLN
510.0000 mg | Freq: Once | INTRAVENOUS | Status: AC
  Administered 2021-12-22: 510 mg via INTRAVENOUS
  Filled 2021-12-22: qty 17

## 2021-12-29 ENCOUNTER — Inpatient Hospital Stay: Payer: Medicare Other | Attending: Hematology & Oncology

## 2021-12-29 VITALS — BP 125/73 | HR 82 | Temp 97.3°F | Resp 17

## 2021-12-29 DIAGNOSIS — D649 Anemia, unspecified: Secondary | ICD-10-CM

## 2021-12-29 DIAGNOSIS — D509 Iron deficiency anemia, unspecified: Secondary | ICD-10-CM | POA: Diagnosis present

## 2021-12-29 MED ORDER — SODIUM CHLORIDE 0.9 % IV SOLN
510.0000 mg | Freq: Once | INTRAVENOUS | Status: AC
  Administered 2021-12-29: 510 mg via INTRAVENOUS
  Filled 2021-12-29: qty 17

## 2021-12-29 MED ORDER — SODIUM CHLORIDE 0.9 % IV SOLN: Freq: Once | INTRAVENOUS | Status: AC

## 2021-12-29 NOTE — Patient Instructions (Signed)

## 2022-02-09 ENCOUNTER — Inpatient Hospital Stay: Payer: Medicare Other | Attending: Hematology & Oncology

## 2022-02-09 ENCOUNTER — Encounter: Payer: Self-pay | Admitting: Hematology & Oncology

## 2022-02-09 ENCOUNTER — Inpatient Hospital Stay (HOSPITAL_BASED_OUTPATIENT_CLINIC_OR_DEPARTMENT_OTHER): Payer: Medicare Other | Admitting: Hematology & Oncology

## 2022-02-09 VITALS — BP 152/75 | HR 62 | Temp 97.9°F | Resp 20 | Ht 68.0 in | Wt 187.0 lb

## 2022-02-09 DIAGNOSIS — E119 Type 2 diabetes mellitus without complications: Secondary | ICD-10-CM | POA: Insufficient documentation

## 2022-02-09 DIAGNOSIS — D509 Iron deficiency anemia, unspecified: Secondary | ICD-10-CM | POA: Diagnosis not present

## 2022-02-09 DIAGNOSIS — D472 Monoclonal gammopathy: Secondary | ICD-10-CM | POA: Insufficient documentation

## 2022-02-09 DIAGNOSIS — D649 Anemia, unspecified: Secondary | ICD-10-CM | POA: Diagnosis not present

## 2022-02-09 DIAGNOSIS — R790 Abnormal level of blood mineral: Secondary | ICD-10-CM

## 2022-02-09 LAB — IRON AND IRON BINDING CAPACITY (CC-WL,HP ONLY)
Iron: 129 ug/dL (ref 45–182)
Saturation Ratios: 32 % (ref 17.9–39.5)
TIBC: 403 ug/dL (ref 250–450)
UIBC: 274 ug/dL (ref 117–376)

## 2022-02-09 LAB — CMP (CANCER CENTER ONLY)
ALT: 28 U/L (ref 0–44)
AST: 25 U/L (ref 15–41)
Albumin: 4.2 g/dL (ref 3.5–5.0)
Alkaline Phosphatase: 37 U/L — ABNORMAL LOW (ref 38–126)
Anion gap: 6 (ref 5–15)
BUN: 15 mg/dL (ref 8–23)
CO2: 30 mmol/L (ref 22–32)
Calcium: 9.9 mg/dL (ref 8.9–10.3)
Chloride: 98 mmol/L (ref 98–111)
Creatinine: 1.15 mg/dL (ref 0.61–1.24)
GFR, Estimated: >60 mL/min (ref >60)
Glucose, Bld: 218 mg/dL — ABNORMAL HIGH (ref 70–99)
Potassium: 3.6 mmol/L (ref 3.5–5.1)
Sodium: 134 mmol/L — ABNORMAL LOW (ref 135–145)
Total Bilirubin: 0.4 mg/dL (ref 0.3–1.2)
Total Protein: 8.4 g/dL — ABNORMAL HIGH (ref 6.5–8.1)

## 2022-02-09 LAB — CBC WITH DIFFERENTIAL (CANCER CENTER ONLY)
Abs Immature Granulocytes: 0.03 10*3/uL (ref 0.00–0.07)
Basophils Absolute: 0 10*3/uL (ref 0.0–0.1)
Basophils Relative: 0 %
Eosinophils Absolute: 0.1 10*3/uL (ref 0.0–0.5)
Eosinophils Relative: 2 %
HCT: 39 % (ref 39.0–52.0)
Hemoglobin: 13 g/dL (ref 13.0–17.0)
Immature Granulocytes: 1 %
Lymphocytes Relative: 29 %
Lymphs Abs: 0.9 10*3/uL (ref 0.7–4.0)
MCH: 30.1 pg (ref 26.0–34.0)
MCHC: 33.3 g/dL (ref 30.0–36.0)
MCV: 90.3 fL (ref 80.0–100.0)
Monocytes Absolute: 0.2 10*3/uL (ref 0.1–1.0)
Monocytes Relative: 5 %
Neutro Abs: 2 10*3/uL (ref 1.7–7.7)
Neutrophils Relative %: 63 %
Platelet Count: 179 10*3/uL (ref 150–400)
RBC: 4.32 MIL/uL (ref 4.22–5.81)
RDW: 15.1 % (ref 11.5–15.5)
WBC Count: 3.2 10*3/uL — ABNORMAL LOW (ref 4.0–10.5)
nRBC: 0 % (ref 0.0–0.2)

## 2022-02-09 LAB — RETICULOCYTES
Immature Retic Fract: 13.1 % (ref 2.3–15.9)
RBC.: 4.33 MIL/uL (ref 4.22–5.81)
Retic Count, Absolute: 74.9 10*3/uL (ref 19.0–186.0)
Retic Ct Pct: 1.7 % (ref 0.4–3.1)

## 2022-02-09 LAB — LACTATE DEHYDROGENASE: LDH: 135 U/L (ref 98–192)

## 2022-02-09 LAB — FERRITIN: Ferritin: 67 ng/mL (ref 24–336)

## 2022-02-09 NOTE — Progress Notes (Signed)
Hematology and Oncology Follow Up Visit  [name] [id number] May 07, 1946 76 y.o. 02/09/2022   Principle Diagnosis:  Smoldering IgG kappa multiple myeloma, high risk  Incidental exophytic lesion of R kidney Intermittent iron deficiency anemia   Current Therapy:   IV iron  - Ferahame given on 12/29/2021 On observation     Interim History:  [name] is here today for follow-up.  He is quite iron deficient so that we saw him.  His ferritin was 6 with an iron saturation of 12%.  We did give a dose of Feraheme.  This made him feel a whole lot better.  He has a lot more energy right now.  He still has the smoldering IgG kappa myeloma.  We last saw him, his monoclonal spike was 2.6 g/dL.  The IgG level was 3700 mg/dL.  His Kappa light chain was 19.0 mg/dL.  He has been to the beach quite often.  He is quite tanned.  I am not sure he uses any sunscreen.  He has had no change in bowel or bladder habits.  He has had no melena or bright red blood per rectum.  He has had no cough.  He has had no shortness of breath.  He does have diabetes.  He is on medication for this.  He has had no fever.  There is been no leg swelling.  He has had no rashes.  Overall, I was his performance status is probably ECOG 1.     Medications:  Allergies as of 02/09/2022       Reactions   Clindamycin Hcl Itching   Metoprolol Succinate Other (See Comments)   UNKNOWN REACTION        Medication List        Accurate as of February 09, 2022  8:37 AM. If you have any questions, ask your nurse or doctor.          amLODipine 10 MG tablet Commonly known as: NORVASC Take 10 mg by mouth every morning.   aspirin EC 81 MG tablet Take 1 tablet (81 mg total) by mouth daily. Swallow whole.   atorvastatin 80 MG tablet Commonly known as: LIPITOR Take 80 mg by mouth daily.   ezetimibe 10 MG tablet Commonly known as: ZETIA Take 10 mg by mouth daily.   furosemide 20 MG tablet Commonly known as:  LASIX Take 1 tablet (20 mg total) by mouth every other day.   glipiZIDE 2.5 MG 24 hr tablet Commonly known as: GLUCOTROL XL Take 2.5 mg by mouth every morning.   metFORMIN 500 MG 24 hr tablet Commonly known as: GLUCOPHAGE-XR Take 4 tablets by mouth daily.   omeprazole 20 MG capsule Commonly known as: PRILOSEC Take 20 mg by mouth 2 (two) times daily before a meal.   rosuvastatin 40 MG tablet Commonly known as: CRESTOR Take 40 mg by mouth daily.   Testosterone 25 MG/2.5GM (1%) Gel Place 1 mg onto the skin every other day.   triamterene-hydrochlorothiazide 37.5-25 MG tablet Commonly known as: MAXZIDE-25 Take 1 tablet by mouth every morning.   Trulicity 1.5 XH/3.7JI Sopn Generic drug: Dulaglutide Inject 1.5 mg into the skin once a week.   valsartan 320 MG tablet Commonly known as: DIOVAN Take 320 mg by mouth daily.        Allergies:  Allergies  Allergen Reactions   Clindamycin Hcl Itching   Metoprolol Succinate Other (See Comments)    UNKNOWN REACTION    Past Medical History, Surgical history, Social history, and  Family History were reviewed and updated.  Review of Systems: Review of Systems  Constitutional: Negative.   HENT: Negative.    Eyes: Negative.   Respiratory: Negative.    Cardiovascular: Negative.   Gastrointestinal: Negative.   Genitourinary: Negative.   Musculoskeletal: Negative.   Skin: Negative.   Neurological: Negative.   Endo/Heme/Allergies: Negative.   Psychiatric/Behavioral: Negative.        Physical Exam:  height is '5\' 8"'  (1.727 m) and weight is 187 lb 0.6 oz (84.8 kg). His oral temperature is 97.9 F (36.6 C). His blood pressure is 152/75 (abnormal) and his pulse is 62. His respiration is 20 and oxygen saturation is 97%.   Wt Readings from Last 3 Encounters:  02/09/22 187 lb 0.6 oz (84.8 kg)  12/12/21 189 lb (85.7 kg)  08/08/21 185 lb (83.9 kg)    Physical Exam Vitals reviewed.  HENT:     Head: Normocephalic and atraumatic.   Eyes:     Pupils: Pupils are equal, round, and reactive to light.  Cardiovascular:     Rate and Rhythm: Normal rate and regular rhythm.     Heart sounds: Normal heart sounds.  Pulmonary:     Effort: Pulmonary effort is normal.     Breath sounds: Normal breath sounds.  Abdominal:     General: Bowel sounds are normal.     Palpations: Abdomen is soft.  Musculoskeletal:        General: No tenderness or deformity. Normal range of motion.     Cervical back: Normal range of motion.  Lymphadenopathy:     Cervical: No cervical adenopathy.  Skin:    General: Skin is warm and dry.     Findings: No erythema or rash.  Neurological:     Mental Status: He is alert and oriented to person, place, and time.  Psychiatric:        Behavior: Behavior normal.        Thought Content: Thought content normal.        Judgment: Judgment normal.      Lab Results  Component Value Date   WBC 3.2 (L) 02/09/2022   HGB 13.0 02/09/2022   HCT 39.0 02/09/2022   MCV 90.3 02/09/2022   PLT 179 02/09/2022   Lab Results  Component Value Date   FERRITIN 6 (L) 12/12/2021   IRON 60 12/12/2021   TIBC 514 (H) 12/12/2021   UIBC 454 (H) 12/12/2021   IRONPCTSAT 12 (L) 12/12/2021   Lab Results  Component Value Date   RETICCTPCT 1.7 02/09/2022   RBC 4.33 02/09/2022   Lab Results  Component Value Date   KPAFRELGTCHN 190.1 (H) 12/12/2021   LAMBDASER 6.4 12/12/2021   KAPLAMBRATIO 29.70 (H) 12/12/2021   Lab Results  Component Value Date   IGGSERUM 4,039 (H) 12/12/2021   IGA 30 (L) 12/12/2021   IGMSERUM 16 12/12/2021   Lab Results  Component Value Date   TOTALPROTELP 7.9 12/12/2021   ALBUMINELP 3.5 12/12/2021   A1GS 0.2 12/12/2021   A2GS 0.7 12/12/2021   BETS 0.9 12/12/2021   GAMS 2.6 (H) 12/12/2021   MSPIKE 2.4 (H) 12/12/2021   SPEI Comment 02/06/2021     Chemistry      Component Value Date/Time   NA 136 12/12/2021 0743   NA 139 06/20/2020 0923   K 3.6 12/12/2021 0743   CL 101 12/12/2021  0743   CO2 28 12/12/2021 0743   BUN 17 12/12/2021 0743   BUN 17 06/20/2020 0923   CREATININE 0.80  12/12/2021 0743   CREATININE 1.12 09/03/2015 1201      Component Value Date/Time   CALCIUM 9.4 12/12/2021 0743   ALKPHOS 35 (L) 12/12/2021 0743   AST 16 12/12/2021 0743   ALT 16 12/12/2021 0743   BILITOT 0.4 12/12/2021 0743       Impression and Plan: Mr. Friesen is a very pleasant 76 yo caucasian gentleman with smoldering IgG kappa multiple myeloma.  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  We will have to see what his myeloma studies are right now.  So far, I am not seeing any changes labs that indicate that we have had to treat him.    I am so happy that his hemoglobin has improved so nicely with the IV iron.  Right now, we will plan to get him back during the Holiday season.  I want to make sure that his blood counts are holding steady.  I am sure that at some point, we probably will have to treat the smoldering myeloma.  Volanda Napoleon, MD 8/14/20238:37 AM

## 2022-02-10 LAB — KAPPA/LAMBDA LIGHT CHAINS
Kappa free light chain: 181.1 mg/L — ABNORMAL HIGH (ref 3.3–19.4)
Kappa, lambda light chain ratio: 21.82 — ABNORMAL HIGH (ref 0.26–1.65)
Lambda free light chains: 8.3 mg/L (ref 5.7–26.3)

## 2022-02-10 LAB — IGG, IGA, IGM
IgA: 24 mg/dL — ABNORMAL LOW (ref 61–437)
IgG (Immunoglobin G), Serum: 3485 mg/dL — ABNORMAL HIGH (ref 603–1613)
IgM (Immunoglobulin M), Srm: 15 mg/dL (ref 15–143)

## 2022-02-11 ENCOUNTER — Encounter: Payer: Self-pay | Admitting: Family

## 2022-02-13 LAB — PROTEIN ELECTROPHORESIS, SERUM, WITH REFLEX
A/G Ratio: 0.9 (ref 0.7–1.7)
Albumin ELP: 3.8 g/dL (ref 2.9–4.4)
Alpha-1-Globulin: 0.2 g/dL (ref 0.0–0.4)
Alpha-2-Globulin: 0.6 g/dL (ref 0.4–1.0)
Beta Globulin: 0.8 g/dL (ref 0.7–1.3)
Gamma Globulin: 2.7 g/dL — ABNORMAL HIGH (ref 0.4–1.8)
Globulin, Total: 4.3 g/dL — ABNORMAL HIGH (ref 2.2–3.9)
M-Spike, %: 2.4 g/dL — ABNORMAL HIGH
SPEP Interpretation: 0
Total Protein ELP: 8.1 g/dL (ref 6.0–8.5)

## 2022-02-13 LAB — IMMUNOFIXATION REFLEX, SERUM
IgA: 25 mg/dL — ABNORMAL LOW (ref 61–437)
IgG (Immunoglobin G), Serum: 3564 mg/dL — ABNORMAL HIGH (ref 603–1613)
IgM (Immunoglobulin M), Srm: 16 mg/dL (ref 15–143)

## 2022-05-11 ENCOUNTER — Inpatient Hospital Stay: Payer: Medicare Other | Attending: Hematology & Oncology

## 2022-05-11 ENCOUNTER — Inpatient Hospital Stay (HOSPITAL_BASED_OUTPATIENT_CLINIC_OR_DEPARTMENT_OTHER): Payer: Medicare Other | Admitting: Hematology & Oncology

## 2022-05-11 ENCOUNTER — Encounter: Payer: Self-pay | Admitting: Hematology & Oncology

## 2022-05-11 VITALS — BP 133/59 | HR 71 | Temp 97.6°F | Resp 20 | Ht 68.0 in | Wt 184.8 lb

## 2022-05-11 DIAGNOSIS — D649 Anemia, unspecified: Secondary | ICD-10-CM

## 2022-05-11 DIAGNOSIS — D472 Monoclonal gammopathy: Secondary | ICD-10-CM

## 2022-05-11 DIAGNOSIS — C9000 Multiple myeloma not having achieved remission: Secondary | ICD-10-CM | POA: Insufficient documentation

## 2022-05-11 LAB — CBC WITH DIFFERENTIAL (CANCER CENTER ONLY)
Abs Immature Granulocytes: 0.02 10*3/uL (ref 0.00–0.07)
Basophils Absolute: 0 10*3/uL (ref 0.0–0.1)
Basophils Relative: 0 %
Eosinophils Absolute: 0.1 10*3/uL (ref 0.0–0.5)
Eosinophils Relative: 2 %
HCT: 38.8 % — ABNORMAL LOW (ref 39.0–52.0)
Hemoglobin: 13.3 g/dL (ref 13.0–17.0)
Immature Granulocytes: 1 %
Lymphocytes Relative: 38 %
Lymphs Abs: 1.3 10*3/uL (ref 0.7–4.0)
MCH: 31.7 pg (ref 26.0–34.0)
MCHC: 34.3 g/dL (ref 30.0–36.0)
MCV: 92.4 fL (ref 80.0–100.0)
Monocytes Absolute: 0.2 10*3/uL (ref 0.1–1.0)
Monocytes Relative: 7 %
Neutro Abs: 1.7 10*3/uL (ref 1.7–7.7)
Neutrophils Relative %: 52 %
Platelet Count: 198 10*3/uL (ref 150–400)
RBC: 4.2 MIL/uL — ABNORMAL LOW (ref 4.22–5.81)
RDW: 12.9 % (ref 11.5–15.5)
WBC Count: 3.3 10*3/uL — ABNORMAL LOW (ref 4.0–10.5)
nRBC: 0 % (ref 0.0–0.2)

## 2022-05-11 LAB — RETICULOCYTES
Immature Retic Fract: 12.5 % (ref 2.3–15.9)
RBC.: 4.11 MIL/uL — ABNORMAL LOW (ref 4.22–5.81)
Retic Count, Absolute: 68.2 10*3/uL (ref 19.0–186.0)
Retic Ct Pct: 1.7 % (ref 0.4–3.1)

## 2022-05-11 LAB — CMP (CANCER CENTER ONLY)
ALT: 21 U/L (ref 0–44)
AST: 17 U/L (ref 15–41)
Albumin: 3.9 g/dL (ref 3.5–5.0)
Alkaline Phosphatase: 45 U/L (ref 38–126)
Anion gap: 7 (ref 5–15)
BUN: 19 mg/dL (ref 8–23)
CO2: 28 mmol/L (ref 22–32)
Calcium: 9.5 mg/dL (ref 8.9–10.3)
Chloride: 99 mmol/L (ref 98–111)
Creatinine: 1.24 mg/dL (ref 0.61–1.24)
GFR, Estimated: 60 mL/min — ABNORMAL LOW (ref >60)
Glucose, Bld: 259 mg/dL — ABNORMAL HIGH (ref 70–99)
Potassium: 3.6 mmol/L (ref 3.5–5.1)
Sodium: 134 mmol/L — ABNORMAL LOW (ref 135–145)
Total Bilirubin: 0.5 mg/dL (ref 0.3–1.2)
Total Protein: 8.9 g/dL — ABNORMAL HIGH (ref 6.5–8.1)

## 2022-05-11 LAB — IRON AND IRON BINDING CAPACITY (CC-WL,HP ONLY)
Iron: 117 ug/dL (ref 45–182)
Saturation Ratios: 31 % (ref 17.9–39.5)
TIBC: 375 ug/dL (ref 250–450)
UIBC: 258 ug/dL (ref 117–376)

## 2022-05-11 LAB — FERRITIN: Ferritin: 35 ng/mL (ref 24–336)

## 2022-05-11 LAB — LACTATE DEHYDROGENASE: LDH: 117 U/L (ref 98–192)

## 2022-05-11 NOTE — Progress Notes (Signed)
Hematology and Oncology Follow Up Visit  [name] [id number] 12/19/1945 76 y.o. 05/11/2022   Principle Diagnosis:  Smoldering IgG kappa multiple myeloma, high risk  Incidental exophytic lesion of R kidney Intermittent iron deficiency anemia   Current Therapy:   IV iron  - Ferahame given on 12/29/2021 On observation     Interim History:  [name] is here today for follow-up.  So far, everything is going quite well for him.  He feels good.  He really has had no complaints.  He has responded well to iron.  I am only last saw him back in August, his ferritin was 67 with an iron saturation 32%.  He does have the smoldering myeloma.  When we last saw him, his M spike was stable at 2.4 g/dL.  His IgG level was 3500 mg/dL.  The Kappa light chain was 18.1 mg/dL.  He has had no change in bowel or bladder habits.  He is in the process of moving.  He is going to move out of [facility].  He is looking forward to this.  He has had no swelling in the legs.  There is been no bleeding.  He has had no rashes.  He has had no fever.  He plays golf twice a week.  The warm weather last week really made it able to play quite a bit of golf.  Overall, I would say his performance status is probably ECOG 0.     Medications:  Allergies as of 05/11/2022       Reactions   Clindamycin Hcl Itching   Metoprolol Succinate Other (See Comments)   UNKNOWN REACTION        Medication List        Accurate as of May 11, 2022  8:06 AM. If you have any questions, ask your nurse or doctor.          STOP taking these medications    atorvastatin 80 MG tablet Commonly known as: LIPITOR Stopped by: Volanda Napoleon, MD       TAKE these medications    amLODipine 10 MG tablet Commonly known as: NORVASC Take 10 mg by mouth every morning.   aspirin EC 81 MG tablet Take 1 tablet (81 mg total) by mouth daily. Swallow whole.   ezetimibe 10 MG tablet Commonly known as: ZETIA Take 10 mg  by mouth daily.   furosemide 20 MG tablet Commonly known as: LASIX Take 1 tablet (20 mg total) by mouth every other day. What changed: when to take this   glipiZIDE 2.5 MG 24 hr tablet Commonly known as: GLUCOTROL XL Take 2.5 mg by mouth every morning.   metFORMIN 500 MG 24 hr tablet Commonly known as: GLUCOPHAGE-XR Take 4 tablets by mouth daily.   omeprazole 20 MG capsule Commonly known as: PRILOSEC Take 20 mg by mouth 2 (two) times daily before a meal.   rosuvastatin 40 MG tablet Commonly known as: CRESTOR Take 40 mg by mouth daily.   Testosterone 25 MG/2.5GM (1%) Gel Place 1 mg onto the skin every other day.   triamterene-hydrochlorothiazide 37.5-25 MG tablet Commonly known as: MAXZIDE-25 Take 1 tablet by mouth every morning.   Trulicity 1.5 NW/2.9FA Sopn Generic drug: Dulaglutide Inject 1.5 mg into the skin once a week.   valsartan 320 MG tablet Commonly known as: DIOVAN Take 320 mg by mouth daily.        Allergies:  Allergies  Allergen Reactions   Clindamycin Hcl Itching   Metoprolol  Succinate Other (See Comments)    UNKNOWN REACTION    Past Medical History, Surgical history, Social history, and Family History were reviewed and updated.  Review of Systems: Review of Systems  Constitutional: Negative.   HENT: Negative.    Eyes: Negative.   Respiratory: Negative.    Cardiovascular: Negative.   Gastrointestinal: Negative.   Genitourinary: Negative.   Musculoskeletal: Negative.   Skin: Negative.   Neurological: Negative.   Endo/Heme/Allergies: Negative.   Psychiatric/Behavioral: Negative.        Physical Exam:  height is _0  (1.727 m) and weight is 184 lb 12.8 oz (83.8 kg). His oral temperature is 97.6 F (36.4 C). His blood pressure is 133/59 (abnormal) and his pulse is 71. His respiration is 20 and oxygen saturation is 100%.   Wt Readings from Last 3 Encounters:  05/11/22 184 lb 12.8 oz (83.8 kg)  02/09/22 187 lb 0.6 oz (84.8 kg)   12/12/21 189 lb (85.7 kg)    Physical Exam Vitals reviewed.  HENT:     Head: Normocephalic and atraumatic.  Eyes:     Pupils: Pupils are equal, round, and reactive to light.  Cardiovascular:     Rate and Rhythm: Normal rate and regular rhythm.     Heart sounds: Normal heart sounds.  Pulmonary:     Effort: Pulmonary effort is normal.     Breath sounds: Normal breath sounds.  Abdominal:     General: Bowel sounds are normal.     Palpations: Abdomen is soft.  Musculoskeletal:        General: No tenderness or deformity. Normal range of motion.     Cervical back: Normal range of motion.  Lymphadenopathy:     Cervical: No cervical adenopathy.  Skin:    General: Skin is warm and dry.     Findings: No erythema or rash.  Neurological:     Mental Status: He is alert and oriented to person, place, and time.  Psychiatric:        Behavior: Behavior normal.        Thought Content: Thought content normal.        Judgment: Judgment normal.      Lab Results  Component Value Date   WBC 3.3 (L) 05/11/2022   HGB 13.3 05/11/2022   HCT 38.8 (L) 05/11/2022   MCV 92.4 05/11/2022   PLT 198 05/11/2022   Lab Results  Component Value Date   FERRITIN 67 02/09/2022   IRON 129 02/09/2022   TIBC 403 02/09/2022   UIBC 274 02/09/2022   IRONPCTSAT 32 02/09/2022   Lab Results  Component Value Date   RETICCTPCT 1.7 05/11/2022   RBC 4.20 (L) 05/11/2022   RBC 4.11 (L) 05/11/2022   Lab Results  Component Value Date   KPAFRELGTCHN 181.1 (H) 02/09/2022   LAMBDASER 8.3 02/09/2022   KAPLAMBRATIO 21.82 (H) 02/09/2022   Lab Results  Component Value Date   IGGSERUM 3,564 (H) 02/09/2022   IGA 25 (L) 02/09/2022   IGMSERUM 16 02/09/2022   Lab Results  Component Value Date   TOTALPROTELP 8.1 02/09/2022   ALBUMINELP 3.8 02/09/2022   A1GS 0.2 02/09/2022   A2GS 0.6 02/09/2022   BETS 0.8 02/09/2022   GAMS 2.7 (H) 02/09/2022   MSPIKE 2.4 (H) 02/09/2022   SPEI Comment 02/06/2021      Chemistry      Component Value Date/Time   NA 134 (L) 02/09/2022 0816   NA 139 06/20/2020 0923   K 3.6 02/09/2022 0816  CL 98 02/09/2022 0816   CO2 30 02/09/2022 0816   BUN 15 02/09/2022 0816   BUN 17 06/20/2020 0923   CREATININE 1.15 02/09/2022 0816   CREATININE 1.12 09/03/2015 1201      Component Value Date/Time   CALCIUM 9.9 02/09/2022 0816   ALKPHOS 37 (L) 02/09/2022 0816   AST 25 02/09/2022 0816   ALT 28 02/09/2022 0816   BILITOT 0.4 02/09/2022 0816       Impression and Plan: Mr. Salva is a very pleasant 76 yo caucasian gentleman with smoldering IgG kappa multiple myeloma.  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  Everything, from my point of view, seems to be doing pretty well.  We will see what his iron studies look like.  We will see what his myeloma studies look like.  He is not anemic.  He is not thrombocytopenic.  Has little bit of leukopenia but this is stable.  Hopefully, we can still follow the smoldering myeloma along.  I will plan to get him back in about 4 months now.  We will try to get him through the Winter.     Volanda Napoleon, MD 11/13/20238:06 AM

## 2022-05-12 LAB — IGG, IGA, IGM
IgA: 23 mg/dL — ABNORMAL LOW (ref 61–437)
IgG (Immunoglobin G), Serum: 3801 mg/dL — ABNORMAL HIGH (ref 603–1613)
IgM (Immunoglobulin M), Srm: 17 mg/dL (ref 15–143)

## 2022-05-12 LAB — BETA 2 MICROGLOBULIN, SERUM: Beta-2 Microglobulin: 2.8 mg/L — ABNORMAL HIGH (ref 0.6–2.4)

## 2022-05-12 LAB — KAPPA/LAMBDA LIGHT CHAINS
Kappa free light chain: 205.2 mg/L — ABNORMAL HIGH (ref 3.3–19.4)
Kappa, lambda light chain ratio: 34.2 — ABNORMAL HIGH (ref 0.26–1.65)
Lambda free light chains: 6 mg/L (ref 5.7–26.3)

## 2022-05-19 LAB — PROTEIN ELECTROPHORESIS, SERUM, WITH REFLEX
A/G Ratio: 0.8 (ref 0.7–1.7)
Albumin ELP: 3.7 g/dL (ref 2.9–4.4)
Alpha-1-Globulin: 0.2 g/dL (ref 0.0–0.4)
Alpha-2-Globulin: 0.7 g/dL (ref 0.4–1.0)
Beta Globulin: 0.8 g/dL (ref 0.7–1.3)
Gamma Globulin: 2.7 g/dL — ABNORMAL HIGH (ref 0.4–1.8)
Globulin, Total: 4.4 g/dL — ABNORMAL HIGH (ref 2.2–3.9)
M-Spike, %: 2.5 g/dL — ABNORMAL HIGH
SPEP Interpretation: 0
Total Protein ELP: 8.1 g/dL (ref 6.0–8.5)

## 2022-05-19 LAB — IMMUNOFIXATION REFLEX, SERUM
IgA: 27 mg/dL — ABNORMAL LOW (ref 61–437)
IgG (Immunoglobin G), Serum: 3946 mg/dL — ABNORMAL HIGH (ref 603–1613)
IgM (Immunoglobulin M), Srm: 18 mg/dL (ref 15–143)

## 2022-05-28 ENCOUNTER — Ambulatory Visit: Payer: Medicare Other | Admitting: Gastroenterology

## 2022-07-07 ENCOUNTER — Encounter: Payer: Self-pay | Admitting: Gastroenterology

## 2022-07-07 ENCOUNTER — Ambulatory Visit (INDEPENDENT_AMBULATORY_CARE_PROVIDER_SITE_OTHER): Payer: Medicare Other | Admitting: Gastroenterology

## 2022-07-07 VITALS — BP 156/82 | Ht 68.0 in | Wt 184.0 lb

## 2022-07-07 DIAGNOSIS — K2270 Barrett's esophagus without dysplasia: Secondary | ICD-10-CM | POA: Diagnosis not present

## 2022-07-07 DIAGNOSIS — Z79899 Other long term (current) drug therapy: Secondary | ICD-10-CM

## 2022-07-07 DIAGNOSIS — D509 Iron deficiency anemia, unspecified: Secondary | ICD-10-CM | POA: Diagnosis not present

## 2022-07-07 MED ORDER — SUTAB 1479-225-188 MG PO TABS: 1.0000 | ORAL_TABLET | Freq: Once | ORAL | 0 refills | Status: AC

## 2022-07-07 NOTE — Patient Instructions (Addendum)
If you are age 79 or older, your body mass index should be between 23-30. Your Body mass index is 27.98 kg/m. If this is out of the aforementioned range listed, please consider follow up with your Primary Care Provider.  If you are age 40 or younger, your body mass index should be between 19-25. Your Body mass index is 27.98 kg/m. If this is out of the aformentioned range listed, please consider follow up with your Primary Care Provider.   ________________________________________________________   [name] have been scheduled for an endoscopy and colonoscopy. Please follow the written instructions given to you at your visit today. Please pick up your prep supplies at the pharmacy within the next 1-3 days. If you use inhalers (even only as needed), please bring them with you on the day of your procedure.   Continue omeprazole.  Thank you for entrusting me with your care and for choosing [facility], Dr. Coalfield Cellar

## 2022-07-07 NOTE — Progress Notes (Signed)
HPI :  77 year old male with a history of colonic adenomas, short segment Barrett's esophagus, CAD with remote history of coronary stenting, smoldering multiple myeloma followed by Dr. Marin Olp of hematology, iron deficiency anemia, referred by Dr. [name] for IDA and Barrett's esophagus.  The patient has a history of smoldering myeloma followed by Dr. Marin Olp, he has never required any treatment for this.  He had close monitoring of his labs.  It looks back in 2020 he had a hemoglobin that fluctuated from 11's to 12's.  This past June his hemoglobin drifted to 9.9 he was found to have an iron deficiency.  He received IV iron with Dr. Marin Olp, last dose this past summer, his hemoglobin responded nicely to 13 with an MCV of 94.  His last labs were November with a stable hemoglobin of 13.3, MCV 92, normal iron studies.  He does not take any oral iron pills at this time.  He takes a baby aspirin once daily but no other blood thinners.  He denies any blood in his stools historically that he can see.  He denies any diarrhea or constipation.  He has occasional gas pain but however has no significant abdominal pains that bother him.  His weight has been stable, no weight loss.  He eats well.  He denies any nausea or vomiting.  He takes omeprazole 20 mg once daily for history of short segment Barrett's esophagus.  He states this typically works quite well for him and he does not have much breakthrough reflux.  He denies any dysphagia.  He denies any routine use of NSAIDs.    He has no family history of GI tract malignancies to include esophagus cancer, gastric cancer, or colon cancer.  He states his diabetes is fairly well-controlled.  He has a history of remote coronary stenting for CAD.  He denies any cardiopulmonary symptoms at all.  In general he feels healthy without any complaints.  His last colonoscopy was in 2019 with Dr. Cristina Gong of [facility].  He had 2 small adenomas at that time.  His last upper  endoscopy looks to be back in 2012 on review of chart and confirming with the patient.  He had a short segment of Barrett's esophagus at that time without dysplasia.  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  Lab Results  Component Value Date   WBC 3.3 (L) 05/11/2022   HGB 13.3 05/11/2022   HCT 38.8 (L) 05/11/2022   MCV 92.4 05/11/2022   PLT 198 05/11/2022       Latest Ref Rng & Units 05/11/2022    7:47 AM 02/09/2022    8:16 AM 12/12/2021    7:43 AM  CBC  WBC 4.0 - 10.5 K/uL 3.3  3.2  3.2   Hemoglobin 13.0 - 17.0 g/dL 13.3  13.0  9.9   Hematocrit 39.0 - 52.0 % 38.8  39.0  30.9   Platelets 150 - 400 K/uL 198  179  199      Labs per Dr. [name] 04/20/22: Hgb 13.0, MCV 94.5, plt 221, WBC 4.0  Iron studies 09/08/21 Iron 88 TIBC 401 Iron sat 22% Ferritin 28  12/12/21: Ferritin 6, iron sat 12% - Hgb 9.9  07/13/21 BUN 15, Cr 0.94 AP 54, ALT 47, AST 31, T bil 0.5 Hgb 12.6, MCV 89.9, plt 229  Hgb 2020 ranged from 11s-12s  Echo 02/12/21: EF 60-65%, grade I DD  Colonoscopy 06/01/2018: 2 x 15m transverse polyps, diverticulosis -  excellent prep - Dr. Cristina Gong - path c/w adenomas   Colonoscopy 09/08/10: 3 diminutive polyps, diverticulosis - Dr. Cristina Gong - path shows at least one adenoma   EGD 09/08/10: "Short segment of Barrett's", gastric AVM, otherwise normal exam - Dr. 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 There is no evidence of thoracic aortic dissection. Coronary artery disease.   No new significant lymphadenopathy seen. No new infiltrates or nodules are seen in the lung fields.   Fatty liver. Small hiatal hernia. Bilateral renal cysts. There is 10 mm hyperdense cyst in the upper pole of right kidney.   Other findings as described in the body of the report.   MR abdomen  08/21/19: IMPRESSION: 1. 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 No suspicious renal lesions 2. Benign hepatic cyst.   Past Medical History:  Diagnosis Date   Anemia    Arthritis    BPH (benign prostatic hyperplasia)    CAD (coronary artery disease)    Collagen vascular disease ([place])    Colon polyp 2019   tubular adenomas   Diabetes mellitus without complication ([place])    Esophageal reflux    Fe deficiency anemia 2012   FHx: colonic polyps 11/04, 01/2009   Gastritis    Hearing loss    wears hearing aid   Heart attack ([place])    Hemorrhoids, internal    Hyperlipidemia    Hypertension    Hypogonadism male    Smoldering myeloma    Wears glasses      Past Surgical History:  Procedure Laterality Date   APPENDECTOMY     CORONARY ANGIOPLASTY WITH STENT PLACEMENT  2007   HAND SURGERY     left 2010, right 2012   [place]   left   left knee     x3   left shoulder     PROSTATE SURGERY     Right CTS     ?   ROTATOR CUFF REPAIR Right 12/2012   TOTAL KNEE ARTHROPLASTY Right 03/08/2015   Procedure: RIGHT TOTAL KNEE ARTHROPLASTY;  Surgeon: Sydnee Cabal, MD;  Location: [facility];  Service: Orthopedics;  Laterality: Right;   VASECTOMY     Family History  Problem Relation Age of Onset   Heart disease Mother    Cancer Father    Other Sister        brain tumor   Cancer Sister    Stroke Brother    Colon polyps Other    Liver disease Other    Colon cancer Neg Hx    Social History   Tobacco Use   Smoking status: Former    Packs/day: 3.00    Years: 15.00    Total pack years: 45.00    Types: Cigarettes    Quit date: 02/19/1984    Years since quitting: 38.4   Smokeless tobacco: Never  Vaping Use   Vaping Use: Never used  Substance Use Topics   Alcohol use: Yes    Alcohol/week: 1.0 standard drink of alcohol    Types: 1 Glasses of wine per week    Comment: occasional glass of wine    Drug use: No   Current Outpatient Medications   Medication Sig Dispense Refill   amLODipine (NORVASC) 10 MG tablet Take 10 mg by mouth every morning.  aspirin EC 81 MG tablet Take 1 tablet (81 mg total) by mouth daily. Swallow whole. 90 tablet 3   ezetimibe (ZETIA) 10 MG tablet Take 10 mg by mouth daily.     furosemide (LASIX) 20 MG tablet Take 1 tablet (20 mg total) by mouth every other day. (Patient taking differently: Take 20 mg by mouth daily.) 45 tablet 3   glipiZIDE (GLUCOTROL XL) 2.5 MG 24 hr tablet Take 2.5 mg by mouth every morning.     metFORMIN (GLUCOPHAGE-XR) 500 MG 24 hr tablet Take 4 tablets by mouth daily.     omeprazole (PRILOSEC) 20 MG capsule Take 20 mg by mouth 2 (two) times daily before a meal.      rosuvastatin (CRESTOR) 40 MG tablet Take 40 mg by mouth daily.     Sodium Sulfate-Mag Sulfate-KCl (SUTAB) [phone] MG TABS Take 1 kit by mouth once for 1 dose. 24 tablet 0   Testosterone 25 MG/2.5GM (1%) GEL Place 1 mg onto the skin every other day.     triamterene-hydrochlorothiazide (MAXZIDE-25) 37.5-25 MG tablet Take 1 tablet by mouth every morning.     TRULICITY 1.5 GB/1.5VV SOPN Inject 1.5 mg into the skin once a week.     valsartan (DIOVAN) 320 MG tablet Take 320 mg by mouth daily.     No current facility-administered medications for this visit.   Allergies  Allergen Reactions   Clindamycin Hcl Itching   Metoprolol Succinate Other (See Comments)    UNKNOWN REACTION     Review of Systems: All systems reviewed and negative except where noted in HPI.   Lab Results  Component Value Date   CREATININE 1.24 05/11/2022   BUN 19 05/11/2022   NA 134 (L) 05/11/2022   K 3.6 05/11/2022   CL 99 05/11/2022   CO2 28 05/11/2022    Lab Results  Component Value Date   ALT 21 05/11/2022   AST 17 05/11/2022   ALKPHOS 45 05/11/2022   BILITOT 0.5 05/11/2022   Other labs per HPI  Physical Exam: BP (!) 156/82 Comment: irregular  Ht '5\' 8"'$  (1.727 m)   Wt 184 lb (83.5 kg)   BMI 27.98 kg/m  Constitutional:  Pleasant,well-developed, male in no acute distress. HEENT: Normocephalic and atraumatic. Conjunctivae are normal. No scleral icterus. Neck supple.  Cardiovascular: Normal rate, regular rhythm.  Pulmonary/chest: Effort normal and breath sounds normal.  Abdominal: Soft, nondistended, nontender.  There are no masses palpable.  Extremities: no edema Lymphadenopathy: No cervical adenopathy noted. Neurological: Alert and oriented to person place and time. Skin: Skin is warm and dry. No rashes noted. Psychiatric: Normal mood and affect. Behavior is normal.   ASSESSMENT: 77 y.o. male here for assessment of the following  1. Iron deficiency anemia, unspecified iron deficiency anemia type   2. Barrett's esophagus without dysplasia   3. Long-term current use of proton pump inhibitor therapy    History of smoldering myeloma which has not required any systemic therapy, developed iron deficiency anemia last year requiring IV iron infusions.  He responded appropriately to it and appears to be holding his Hgb okay.  He is otherwise asymptomatic from GI perspective at this time.  I reviewed his last EGD and colonoscopy with him.  He has a history of short segment Barrett's, his last EGD was 2012, he is overdue for surveillance for his Barrett's esophagus.  Last colonoscopy in 2019 as outlined.  Given his interval development of iron deficiency and history of Barrett's esophagus, recommend an EGD and  colonoscopy to further evaluate.  I discussed risks / benefits of these exams and anesthesia with him and he wants to proceed.  He appears stable from a cardiopulmonary standpoint.  I discussed differential dx for iron deficiency anemia with him.  He did have a history of small gastric AVM in 2012, will see if he has this or any additional AVMs.  Otherwise I counseled him on Barrett's esophagus in general, long-term risks for esophageal cancer.  Overall risk is low, however he is again due for surveillance.   However if this exam is normal and no high risk lesions, he likely would not warrant any further surveillance given his age.  If his colonoscopy shows no high risk lesions he probably does not warrant any further surveillance colonoscopy is as well after this exam.  We otherwise discussed long-term risks of chronic PPI use.  Long-term omeprazole is recommended indefinitely if he does have a short segment of Barrett's esophagus to reduce his risk for esophageal cancer.  He understands this and agrees.   PLAN: - recommend EGD and colonoscopy at the [facility] (offered this week, he preferred to schedule in February) - continue omeprazole daily - counseled on risks / benefits of chronic PPI use, and Barrett's esophagus - if EGD and colonoscopy are negative for clear cause of IDA, can consider capsule endoscopy. Hopefully that is not needed given he responded well to iron. Last cross sectional imaging without concerning findings as above - repeat labs in March with Dr. Marin Olp  Jolly Mango, MD [facility]  CC: [name], MD

## 2022-08-18 ENCOUNTER — Ambulatory Visit (AMBULATORY_SURGERY_CENTER): Payer: Medicare Other | Admitting: Gastroenterology

## 2022-08-18 ENCOUNTER — Encounter: Payer: Self-pay | Admitting: Gastroenterology

## 2022-08-18 VITALS — BP 155/74 | HR 52 | Temp 98.4°F | Resp 11 | Ht 68.0 in | Wt 184.0 lb

## 2022-08-18 DIAGNOSIS — Z86010 Personal history of colonic polyps: Secondary | ICD-10-CM | POA: Diagnosis not present

## 2022-08-18 DIAGNOSIS — D120 Benign neoplasm of cecum: Secondary | ICD-10-CM

## 2022-08-18 DIAGNOSIS — D509 Iron deficiency anemia, unspecified: Secondary | ICD-10-CM

## 2022-08-18 DIAGNOSIS — Z09 Encounter for follow-up examination after completed treatment for conditions other than malignant neoplasm: Secondary | ICD-10-CM

## 2022-08-18 DIAGNOSIS — K319 Disease of stomach and duodenum, unspecified: Secondary | ICD-10-CM

## 2022-08-18 DIAGNOSIS — D125 Benign neoplasm of sigmoid colon: Secondary | ICD-10-CM

## 2022-08-18 DIAGNOSIS — D122 Benign neoplasm of ascending colon: Secondary | ICD-10-CM

## 2022-08-18 DIAGNOSIS — K2270 Barrett's esophagus without dysplasia: Secondary | ICD-10-CM

## 2022-08-18 MED ORDER — SODIUM CHLORIDE 0.9 % IV SOLN: 500.0000 mL | INTRAVENOUS | Status: DC

## 2022-08-18 NOTE — Progress Notes (Signed)
Report to PACU, RN, vss, BBS= Clear.  

## 2022-08-18 NOTE — Progress Notes (Signed)
Called to room to assist during endoscopic procedure.  Patient ID and intended procedure confirmed with present staff. Received instructions for my participation in the procedure from the performing physician.  

## 2022-08-18 NOTE — Progress Notes (Signed)
Pt's states no medical or surgical changes since previsit or office visit. 

## 2022-08-18 NOTE — Progress Notes (Signed)
[name] History and Physical   Primary Care Physician:  [name], MD   Reason for Procedure:   IDA, BE, history of polyps  Plan:    EGD and colonoscopy     HPI: [name] is a 77 y.o. male  here for EGD and colonoscopy.   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 History of polyps in the past, 2 adenomas removed 2019 Dr. Carma Leaven. On omeprazole.  Patient denies any bowel symptoms at this time. Otherwise feels well without any cardiopulmonary symptoms.   I have discussed risks / benefits of anesthesia and endoscopic procedure with [facility] and they wish to proceed with the exams as outlined today.    Past Medical History:  Diagnosis Date   Anemia    Arthritis    BPH (benign prostatic hyperplasia)    CAD (coronary artery disease)    Collagen vascular disease ([place])    Colon polyp 2019   tubular adenomas   Diabetes mellitus without complication ([place])    Esophageal reflux    Fe deficiency anemia 2012   FHx: colonic polyps 11/04, 01/2009   Gastritis    Hearing loss    wears hearing aid   Heart attack ([place])    Hemorrhoids, internal    Hyperlipidemia    Hypertension    Hypogonadism male    Smoldering myeloma    Wears glasses     Past Surgical History:  Procedure Laterality Date   APPENDECTOMY     CORONARY ANGIOPLASTY WITH STENT PLACEMENT  2007   HAND SURGERY     left 2010, right 2012   [place]   left   left knee     x3   left shoulder     PROSTATE SURGERY     Right CTS     ?   ROTATOR CUFF REPAIR Right 12/2012   TOTAL KNEE ARTHROPLASTY Right 03/08/2015   Procedure: RIGHT TOTAL KNEE ARTHROPLASTY;  Surgeon: Sydnee Cabal, MD;  Location: [facility];  Service: Orthopedics;  Laterality: Right;   VASECTOMY      Prior to Admission medications   Medication Sig Start Date End Date Taking? Authorizing Provider  amLODipine (NORVASC) 10 MG tablet Take 10 mg by mouth every morning. 11/10/19  Yes  [provider]  aspirin EC 81 MG tablet Take 1 tablet (81 mg total) by mouth daily. Swallow whole. 08/01/21  Yes Deboraha Sprang, MD  ezetimibe (ZETIA) 10 MG tablet Take 10 mg by mouth daily. 03/15/20  Yes [provider]  glipiZIDE (GLUCOTROL XL) 2.5 MG 24 hr tablet Take 2.5 mg by mouth every morning. 11/14/21  Yes [provider]  metFORMIN (GLUCOPHAGE-XR) 500 MG 24 hr tablet Take 4 tablets by mouth daily. 02/13/19  Yes [provider]  omeprazole (PRILOSEC) 20 MG capsule Take 20 mg by mouth 2 (two) times daily before a meal.    Yes [provider]  rosuvastatin (CRESTOR) 40 MG tablet Take 40 mg by mouth daily. 02/12/20  Yes [provider]  Testosterone 25 MG/2.5GM (1%) GEL Place 1 mg onto the skin every other day.   Yes [provider]  triamterene-hydrochlorothiazide (MAXZIDE-25) 37.5-25 MG tablet Take 1 tablet by mouth every morning. 11/13/21  Yes [provider]  TRULICITY 1.5 0000000 SOPN Inject 1.5 mg into the skin once a week. 11/14/21  Yes [provider]  valsartan (DIOVAN) 320 MG tablet Take 320 mg by mouth  daily.   Yes [provider]    Current Outpatient Medications  Medication Sig Dispense Refill   amLODipine (NORVASC) 10 MG tablet Take 10 mg by mouth every morning.     aspirin EC 81 MG tablet Take 1 tablet (81 mg total) by mouth daily. Swallow whole. 90 tablet 3   ezetimibe (ZETIA) 10 MG tablet Take 10 mg by mouth daily.     glipiZIDE (GLUCOTROL XL) 2.5 MG 24 hr tablet Take 2.5 mg by mouth every morning.     metFORMIN (GLUCOPHAGE-XR) 500 MG 24 hr tablet Take 4 tablets by mouth daily.     omeprazole (PRILOSEC) 20 MG capsule Take 20 mg by mouth 2 (two) times daily before a meal.      rosuvastatin (CRESTOR) 40 MG tablet Take 40 mg by mouth daily.     Testosterone 25 MG/2.5GM (1%) GEL Place 1 mg onto the skin every other day.     triamterene-hydrochlorothiazide (MAXZIDE-25) 37.5-25 MG tablet Take  1 tablet by mouth every morning.     TRULICITY 1.5 0000000 SOPN Inject 1.5 mg into the skin once a week.     valsartan (DIOVAN) 320 MG tablet Take 320 mg by mouth daily.     Current Facility-Administered Medications  Medication Dose Route Frequency Provider Last Rate Last Admin   0.9 %  sodium chloride infusion  500 mL Intravenous Continuous Chareese Sergent, Carlota Raspberry, MD        Allergies as of 08/18/2022 - Review Complete 08/18/2022  Allergen Reaction Noted   Clindamycin hcl Itching 02/19/2011   Metoprolol succinate Other (See Comments) 02/19/2011    Family History  Problem Relation Age of Onset   Heart disease Mother    Cancer Father    Other Sister        brain tumor   Cancer Sister    Stroke Brother    Colon polyps Other    Liver disease Other    Colon cancer Neg Hx    Esophageal cancer Neg Hx    Rectal cancer Neg Hx    Stomach cancer Neg Hx     Social History   Socioeconomic History   Marital status: Widowed    Spouse name: Not on file   Number of children: Not on file   Years of education: Not on file   Highest education level: Not on file  Occupational History   Occupation: Operations Manager--[employer]    Employer: [employer]  Tobacco Use   Smoking status: Former    Packs/day: 3.00    Years: 15.00    Total pack years: 45.00    Types: Cigarettes    Quit date: 02/19/1984    Years since quitting: 38.5   Smokeless tobacco: Never  Vaping Use   Vaping Use: Never used  Substance and Sexual Activity   Alcohol use: Yes    Alcohol/week: 1.0 standard drink of alcohol    Types: 1 Glasses of wine per week    Comment: occasional glass of wine    Drug use: No   Sexual activity: Yes  Other Topics Concern   Not on file  Social History Narrative   ** Merged History Encounter **       Social Determinants of Health   Financial Resource Strain: Not on file  Food Insecurity: Not on file  Transportation Needs: Not on file  Physical Activity: Not on file  Stress:  Not on file  Social Connections: Not on file  Intimate Partner Violence: Not on  file    Review of Systems: All other review of systems negative except as mentioned in the HPI.  Physical Exam: Vital signs BP 122/65   Pulse 63   Temp 98.4 F (36.9 C) (Skin)   Resp 13   Ht 5' 8"$  (1.727 m)   Wt 184 lb (83.5 kg)   SpO2 98%   BMI 27.98 kg/m   General:   Alert,  Well-developed, pleasant and cooperative in NAD Lungs:  Clear throughout to auscultation.   Heart:  Regular rate and rhythm Abdomen:  Soft, nontender and nondistended.   Neuro/Psych:  Alert and cooperative. Normal mood and affect. A and O x 3  Jolly Mango, MD [facility]

## 2022-08-18 NOTE — Op Note (Signed)
[name]: [name] Procedure Date: 08/18/2022 9:11 AM MRN: [id number] Endoscopist: Remo Lipps P. Havery Moros , MD, [id number] Age: 77 Referring MD:  Date of Birth: 1945/08/10 Gender: Male Account #: [id number] Procedure:                Colonoscopy Indications:              Iron deficiency anemia, history of polyps - 2                            adenomas removed 2019 (Dr. Cristina Gong) Medicines:                Monitored Anesthesia Care Procedure:                Pre-Anesthesia Assessment:                           - Prior to the procedure, a History and Physical                            was performed, and patient medications and                            allergies were reviewed. The patient's tolerance of                            previous anesthesia was also reviewed. The risks                            and benefits of the procedure and the sedation                            options and risks were discussed with the patient.                            All questions were answered, and informed consent                            was obtained. Prior Anticoagulants: The patient has                            taken no anticoagulant or antiplatelet agents. ASA                            Grade Assessment: II - A patient with mild systemic                            disease. After reviewing the risks and benefits,                            the patient was deemed in satisfactory condition to                            undergo the procedure.  After obtaining informed consent, the colonoscope                            was passed under direct vision. Throughout the                            procedure, the patient's blood pressure, pulse, and                            oxygen saturations were monitored continuously. The                            CF [id number] was introduced through the anus                            and advanced to the  the cecum, identified by                            appendiceal orifice and ileocecal valve. The                            colonoscopy was performed without difficulty. The                            patient tolerated the procedure well. The quality                            of the bowel preparation was adequate. The                            ileocecal valve, appendiceal orifice, and rectum                            were photographed. Scope In: 9:26:57 AM Scope Out: 9:50:46 AM Scope Withdrawal Time: 0 hours 18 minutes 0 seconds  Total Procedure Duration: 0 hours 23 minutes 49 seconds  Findings:                 The perianal and digital rectal examinations were                            normal.                           Two sessile polyps were found in the cecum. The                            polyps were 3 to 4 mm in size. These polyps were                            removed with a cold snare. Resection and retrieval                            were complete.  A 3 mm polyp was found in the ascending colon. The                            polyp was sessile. The polyp was removed with a                            cold snare. Resection and retrieval were complete.                           A 4 mm polyp was found in the sigmoid colon. The                            polyp was sessile. The polyp was removed with a                            cold snare. Resection and retrieval were complete.                           Multiple small-mouthed diverticula were found in                            the transverse colon and left colon.                           Internal hemorrhoids were found during retroflexion.                           The exam was otherwise without abnormality. Complications:            No immediate complications. Estimated blood loss:                            Minimal. Estimated Blood Loss:     Estimated blood loss was minimal. Impression:                - Two 3 to 4 mm polyps in the cecum, removed with a                            cold snare. Resected and retrieved.                           - One 3 mm polyp in the ascending colon, removed                            with a cold snare. Resected and retrieved.                           - One 4 mm polyp in the sigmoid colon, removed with                            a cold snare. Resected and retrieved.                           -  Diverticulosis in the transverse colon and in the                            left colon.                           - Internal hemorrhoids.                           - The examination was otherwise normal.                           No cause on colonoscopy for IDA. Recommendation:           - Patient has a contact number available for                            emergencies. The signs and symptoms of potential                            delayed complications were discussed with the                            patient. Return to normal activities tomorrow.                            Written discharge instructions were provided to the                            patient.                           - Resume previous diet.                           - Continue present medications.                           - Await pathology results. Remo Lipps P. Havery Moros, MD 08/18/2022 9:58:43 AM This report has been signed electronically.

## 2022-08-18 NOTE — Op Note (Signed)
[place] Patient Name: [name] Procedure Date: 08/18/2022 9:12 AM MRN: [id number] Endoscopist: Remo Lipps P. Havery Moros , MD, [id number] Age: 77 Referring MD:  Date of Birth: [birth year] Gender: Male Account #: [id number] Procedure:                Upper GI endoscopy Indications:              Iron deficiency anemia - resolved with IV iron,                            reported history of short segment Barrett's on EGD                            2012, on omeprazole Medicines:                Monitored Anesthesia Care Procedure:                Pre-Anesthesia Assessment:                           - Prior to the procedure, a History and Physical                            was performed, and patient medications and                            allergies were reviewed. The patient's tolerance of                            previous anesthesia was also reviewed. The risks                            and benefits of the procedure and the sedation                            options and risks were discussed with the patient.                            All questions were answered, and informed consent                            was obtained. Prior Anticoagulants: The patient has                            taken no anticoagulant or antiplatelet agents. ASA                            Grade Assessment: II - A patient with mild systemic                            disease. After reviewing the risks and benefits,                            the patient was deemed in satisfactory condition to  undergo the procedure.                           After obtaining informed consent, the endoscope was                            passed under direct vision. Throughout the                            procedure, the patient's blood pressure, pulse, and                            oxygen saturations were monitored continuously. The                            Olympus Scope [id number] was  introduced through the                            mouth, and advanced to the second part of duodenum.                            The upper GI endoscopy was accomplished without                            difficulty. The patient tolerated the procedure                            well. Scope In: Scope Out: Findings:                 Esophagogastric landmarks were identified: the                            Z-line was found at 39 cm, the gastroesophageal                            junction was found at 39 cm and the upper extent of                            the gastric folds was found at 40 cm from the                            incisors.                           A 1 cm hiatal hernia was present.                           The Z-line was just slightly irregular but did not                            meet criteria for Barrett's.                           The exam of the esophagus was otherwise normal.  Patchy mildly erythematous mucosa was found in the                            gastric fundus and in the gastric antrum.                           The exam of the stomach was otherwise normal.                           Biopsies were taken with a cold forceps for                            Helicobacter pylori testing.                           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                           The exam of the duodenum was otherwise normal. Complications:            No immediate complications. Estimated blood loss:                            Minimal. Estimated Blood Loss:     Estimated blood loss was minimal. Impression:               - Esophagogastric landmarks identified.                           - 1 cm hiatal hernia.                           - Z-line irregular but did not meet criteria for                            Barrett's                           - Normal esophagus otherwise                            - Erythematous mucosa in the gastric fundus and                            antrum.                           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                           - Normal duodenum otherwise.                           AVMs could be cause of iron deficiency, which has                            since resolved with IV iron. Recommendation:           - Patient has a contact number available for                            emergencies. The signs and symptoms of potential                            delayed complications were discussed with the                            patient. Return to normal activities tomorrow.                            Written discharge instructions were provided to the                            patient.                           - Resume previous diet.                           - Continue present medications.                           - Await pathology results.                           - Monitor Hgb, given IV iron PRN. If IDA recurs or                            persists despite iron supplementation, can consider                            ablation of AVMs, hopefully that is not needed Remo Lipps P. Havery Moros, MD 08/18/2022 10:04:08 AM This report has been signed electronically.

## 2022-08-18 NOTE — Patient Instructions (Signed)
Discharge instructions given. Handouts on polyps,diverticulosis and hemorrhoids. Resume previous medications. See recommendations. YOU HAD AN ENDOSCOPIC PROCEDURE TODAY AT [facility]:   Refer to the procedure report that was given to you for any specific questions about what was found during the examination.  If the procedure report does not answer your questions, please call your gastroenterologist to clarify.  If you requested that your care partner not be given the details of your procedure findings, then the procedure report has been included in a sealed envelope for you to review at your convenience later.  YOU SHOULD EXPECT: Some feelings of bloating in the abdomen. Passage of more gas than usual.  Walking can help get rid of the air that was put into your GI tract during the procedure and reduce the bloating. If you had a lower endoscopy (such as a colonoscopy or flexible sigmoidoscopy) you may notice spotting of blood in your stool or on the toilet paper. If you underwent a bowel prep for your procedure, you may not have a normal bowel movement for a few days.  Please Note:  You might notice some irritation and congestion in your nose or some drainage.  This is from the oxygen used during your procedure.  There is no need for concern and it should clear up in a day or so.  SYMPTOMS TO REPORT IMMEDIATELY:  Following lower endoscopy (colonoscopy or flexible sigmoidoscopy):  Excessive amounts of blood in the stool  Significant tenderness or worsening of abdominal pains  Swelling of the abdomen that is new, acute  Fever of 100F or higher  Following upper endoscopy (EGD)  Vomiting of blood or coffee ground material  New chest pain or pain under the shoulder blades  Painful or persistently difficult swallowing  New shortness of breath  Fever of 100F or higher  Black, tarry-looking stools  For urgent or emergent issues, a gastroenterologist can be reached at any hour by  calling [phone]. Do not use MyChart messaging for urgent concerns.    DIET:  We do recommend a small meal at first, but then you may proceed to your regular diet.  Drink plenty of fluids but you should avoid alcoholic beverages for 24 hours.  ACTIVITY:  You should plan to take it easy for the rest of today and you should NOT DRIVE or use heavy machinery until tomorrow (because of the sedation medicines used during the test).    FOLLOW UP: Our staff will call the number listed on your records the next business day following your procedure.  We will call around 7:15- 8:00 am to check on you and address any questions or concerns that you may have regarding the information given to you following your procedure. If we do not reach you, we will leave a message.     If any biopsies were taken you will be contacted by phone or by letter within the next 1-3 weeks.  Please call us at [phone] if you have not heard about the biopsies in 3 weeks.    SIGNATURES/CONFIDENTIALITY: You and/or your care partner have signed paperwork which will be entered into your electronic medical record.  These signatures attest to the fact that that the information above on your After Visit Summary has been reviewed and is understood.  Full responsibility of the confidentiality of this discharge information lies with you and/or your care-partner.

## 2022-08-19 ENCOUNTER — Telehealth: Payer: Self-pay | Admitting: *Deleted

## 2022-08-19 NOTE — Telephone Encounter (Signed)
  Follow up Call-     08/18/2022    8:23 AM  Call back number  Post procedure Call Back phone  # [phone]  Permission to leave phone message Yes     Patient questions:  Do you have a fever, pain , or abdominal swelling? No. Pain Score  0 *  Have you tolerated food without any problems? Yes.    Have you been able to return to your normal activities? Yes.    Do you have any questions about your discharge instructions: Diet   No. Medications  No. Follow up visit  No.  Do you have questions or concerns about your Care? No.  Actions: * If pain score is 4 or above: No action needed, pain <4.

## 2022-08-20 ENCOUNTER — Other Ambulatory Visit: Payer: Self-pay

## 2022-08-20 DIAGNOSIS — D509 Iron deficiency anemia, unspecified: Secondary | ICD-10-CM

## 2022-08-20 LAB — LAB REPORT - SCANNED: EGFR: 72

## 2022-08-25 ENCOUNTER — Encounter: Payer: Self-pay | Admitting: Gastroenterology

## 2022-08-31 ENCOUNTER — Inpatient Hospital Stay (HOSPITAL_BASED_OUTPATIENT_CLINIC_OR_DEPARTMENT_OTHER): Payer: Medicare Other | Admitting: Hematology & Oncology

## 2022-08-31 ENCOUNTER — Encounter: Payer: Self-pay | Admitting: *Deleted

## 2022-08-31 ENCOUNTER — Inpatient Hospital Stay: Payer: Medicare Other | Attending: Hematology & Oncology

## 2022-08-31 ENCOUNTER — Encounter: Payer: Self-pay | Admitting: Hematology & Oncology

## 2022-08-31 ENCOUNTER — Other Ambulatory Visit: Payer: Self-pay

## 2022-08-31 VITALS — Ht 68.0 in | Wt 188.0 lb

## 2022-08-31 DIAGNOSIS — D472 Monoclonal gammopathy: Secondary | ICD-10-CM | POA: Diagnosis not present

## 2022-08-31 DIAGNOSIS — Z7984 Long term (current) use of oral hypoglycemic drugs: Secondary | ICD-10-CM | POA: Insufficient documentation

## 2022-08-31 DIAGNOSIS — D649 Anemia, unspecified: Secondary | ICD-10-CM

## 2022-08-31 DIAGNOSIS — D509 Iron deficiency anemia, unspecified: Secondary | ICD-10-CM | POA: Diagnosis not present

## 2022-08-31 DIAGNOSIS — Z7982 Long term (current) use of aspirin: Secondary | ICD-10-CM | POA: Diagnosis not present

## 2022-08-31 DIAGNOSIS — Z79899 Other long term (current) drug therapy: Secondary | ICD-10-CM | POA: Diagnosis not present

## 2022-08-31 DIAGNOSIS — E119 Type 2 diabetes mellitus without complications: Secondary | ICD-10-CM | POA: Diagnosis not present

## 2022-08-31 DIAGNOSIS — C9000 Multiple myeloma not having achieved remission: Secondary | ICD-10-CM | POA: Insufficient documentation

## 2022-08-31 LAB — CBC WITH DIFFERENTIAL (CANCER CENTER ONLY)
Abs Immature Granulocytes: 0.01 10*3/uL (ref 0.00–0.07)
Basophils Absolute: 0 10*3/uL (ref 0.0–0.1)
Basophils Relative: 0 %
Eosinophils Absolute: 0.1 10*3/uL (ref 0.0–0.5)
Eosinophils Relative: 3 %
HCT: 35.9 % — ABNORMAL LOW (ref 39.0–52.0)
Hemoglobin: 12.5 g/dL — ABNORMAL LOW (ref 13.0–17.0)
Immature Granulocytes: 0 %
Lymphocytes Relative: 33 %
Lymphs Abs: 1.4 10*3/uL (ref 0.7–4.0)
MCH: 32.1 pg (ref 26.0–34.0)
MCHC: 34.8 g/dL (ref 30.0–36.0)
MCV: 92.1 fL (ref 80.0–100.0)
Monocytes Absolute: 0.3 10*3/uL (ref 0.1–1.0)
Monocytes Relative: 7 %
Neutro Abs: 2.3 10*3/uL (ref 1.7–7.7)
Neutrophils Relative %: 57 %
Platelet Count: 212 10*3/uL (ref 150–400)
RBC: 3.9 MIL/uL — ABNORMAL LOW (ref 4.22–5.81)
RDW: 12.8 % (ref 11.5–15.5)
WBC Count: 4.2 10*3/uL (ref 4.0–10.5)
nRBC: 0 % (ref 0.0–0.2)

## 2022-08-31 LAB — CMP (CANCER CENTER ONLY)
ALT: 26 U/L (ref 0–44)
AST: 21 U/L (ref 15–41)
Albumin: 4 g/dL (ref 3.5–5.0)
Alkaline Phosphatase: 46 U/L (ref 38–126)
Anion gap: 9 (ref 5–15)
BUN: 15 mg/dL (ref 8–23)
CO2: 26 mmol/L (ref 22–32)
Calcium: 9.3 mg/dL (ref 8.9–10.3)
Chloride: 98 mmol/L (ref 98–111)
Creatinine: 1.09 mg/dL (ref 0.61–1.24)
GFR, Estimated: >60 mL/min (ref >60)
Glucose, Bld: 239 mg/dL — ABNORMAL HIGH (ref 70–99)
Potassium: 3.7 mmol/L (ref 3.5–5.1)
Sodium: 133 mmol/L — ABNORMAL LOW (ref 135–145)
Total Bilirubin: 0.4 mg/dL (ref 0.3–1.2)
Total Protein: 8.8 g/dL — ABNORMAL HIGH (ref 6.5–8.1)

## 2022-08-31 LAB — LACTATE DEHYDROGENASE: LDH: 123 U/L (ref 98–192)

## 2022-08-31 LAB — IRON AND IRON BINDING CAPACITY (CC-WL,HP ONLY)
Iron: 86 ug/dL (ref 45–182)
Saturation Ratios: 22 % (ref 17.9–39.5)
TIBC: 395 ug/dL (ref 250–450)
UIBC: 309 ug/dL (ref 117–376)

## 2022-08-31 LAB — SAVE SMEAR(SSMR), FOR PROVIDER SLIDE REVIEW

## 2022-08-31 LAB — FERRITIN: Ferritin: 46 ng/mL (ref 24–336)

## 2022-08-31 LAB — RETICULOCYTES
Immature Retic Fract: 16 % — ABNORMAL HIGH (ref 2.3–15.9)
RBC.: 3.88 MIL/uL — ABNORMAL LOW (ref 4.22–5.81)
Retic Count, Absolute: 87.7 10*3/uL (ref 19.0–186.0)
Retic Ct Pct: 2.3 % (ref 0.4–3.1)

## 2022-08-31 NOTE — Progress Notes (Signed)
Hematology and Oncology Follow Up Visit  [name] [id number] 14-Mar-1946 77 y.o. 08/31/2022   Principle Diagnosis:  Smoldering IgG kappa multiple myeloma, high risk  Incidental exophytic lesion of R kidney Intermittent iron deficiency anemia   Current Therapy:   IV iron  - Ferahame given on 12/29/2021 On observation     Interim History:  [name] is here today for follow-up.  We saw him back in November.  Since then, he has been doing quite well.  His biggest problem is the diabetes.  He has been followed by Dr. [name] for his diabetes.  Dr. [name], as always, does a great job.  Apparently, the problem is trying to get Trulicity for him.  This is been very difficult.  As far as his smoldering IgG kappa myeloma goes, he is holding steady.  His last monoclonal spike was 2.5 g/dL.  His IgG level was 3900 mg/dL.  The Kappa light chain was 20.5 mg/dL.  He has had no problem with infections.  The been no problems with COVID.  He has had no issues with nausea or vomiting.  He has had no rashes.   His last iron studies that were done back in November showed a ferritin of 35 with an iron saturation of 31%.   Overall, I would say that his performance status right now is probably ECOG 1.    Medications:  Allergies as of 08/31/2022       Reactions   Clindamycin Hcl Itching   Metoprolol Succinate Other (See Comments)   Pt does not recall.        Medication List        Accurate as of August 31, 2022  9:10 AM. If you have any questions, ask your nurse or doctor.          amLODipine 10 MG tablet Commonly known as: NORVASC Take 10 mg by mouth every morning.   aspirin EC 81 MG tablet Take 1 tablet (81 mg total) by mouth daily. Swallow whole.   ezetimibe 10 MG tablet Commonly known as: ZETIA Take 10 mg by mouth daily.   furosemide 20 MG tablet Commonly known as: LASIX Take 20 mg by mouth daily.   glipiZIDE 2.5 MG 24 hr tablet Commonly known as: GLUCOTROL XL Take  2.5 mg by mouth every morning.   metFORMIN 500 MG 24 hr tablet Commonly known as: GLUCOPHAGE-XR Take 4 tablets by mouth daily.   omeprazole 20 MG capsule Commonly known as: PRILOSEC Take 20 mg by mouth 2 (two) times daily before a meal.   Ozempic (0.25 or 0.5 MG/DOSE) 2 MG/3ML Sopn Generic drug: Semaglutide(0.25 or 0.'5MG'$ /DOS) Inject into the skin.   rosuvastatin 40 MG tablet Commonly known as: CRESTOR Take 40 mg by mouth daily.   Testosterone 25 MG/2.5GM (1%) Gel Place 1 mg onto the skin every other day.   triamterene-hydrochlorothiazide 37.5-25 MG tablet Commonly known as: MAXZIDE-25 Take 1 tablet by mouth every morning.   Trulicity 1.5 0000000 Sopn Generic drug: Dulaglutide Inject 1.5 mg into the skin once a week.   valsartan 320 MG tablet Commonly known as: DIOVAN Take 320 mg by mouth daily.        Allergies:  Allergies  Allergen Reactions   Clindamycin Hcl Itching   Metoprolol Succinate Other (See Comments)    Pt does not recall.    Past Medical History, Surgical history, Social history, and Family History were reviewed and updated.  Review of Systems: Review of Systems  Constitutional: Negative.  HENT: Negative.    Eyes: Negative.   Respiratory: Negative.    Cardiovascular: Negative.   Gastrointestinal: Negative.   Genitourinary: Negative.   Musculoskeletal: Negative.   Skin: Negative.   Neurological: Negative.   Endo/Heme/Allergies: Negative.   Psychiatric/Behavioral: Negative.        Physical Exam:  height is '5\' 8"'$  (1.727 m) and weight is 188 lb (85.3 kg).  His vital signs are temperature of 98.  Pulse 71.  Blood pressure 139/66.  Weight is 188 pounds.  Wt Readings from Last 3 Encounters:  08/31/22 188 lb (85.3 kg)  08/18/22 184 lb (83.5 kg)  07/07/22 184 lb (83.5 kg)    Physical Exam Vitals reviewed.  HENT:     Head: Normocephalic and atraumatic.  Eyes:     Pupils: Pupils are equal, round, and reactive to light.  Cardiovascular:      Rate and Rhythm: Normal rate and regular rhythm.     Heart sounds: Normal heart sounds.  Pulmonary:     Effort: Pulmonary effort is normal.     Breath sounds: Normal breath sounds.  Abdominal:     General: Bowel sounds are normal.     Palpations: Abdomen is soft.  Musculoskeletal:        General: No tenderness or deformity. Normal range of motion.     Cervical back: Normal range of motion.  Lymphadenopathy:     Cervical: No cervical adenopathy.  Skin:    General: Skin is warm and dry.     Findings: No erythema or rash.  Neurological:     Mental Status: He is alert and oriented to person, place, and time.  Psychiatric:        Behavior: Behavior normal.        Thought Content: Thought content normal.        Judgment: Judgment normal.      Lab Results  Component Value Date   WBC 4.2 08/31/2022   HGB 12.5 (L) 08/31/2022   HCT 35.9 (L) 08/31/2022   MCV 92.1 08/31/2022   PLT 212 08/31/2022   Lab Results  Component Value Date   FERRITIN 35 05/11/2022   IRON 117 05/11/2022   TIBC 375 05/11/2022   UIBC 258 05/11/2022   IRONPCTSAT 31 05/11/2022   Lab Results  Component Value Date   RETICCTPCT 2.3 08/31/2022   RBC 3.88 (L) 08/31/2022   RBC 3.90 (L) 08/31/2022   Lab Results  Component Value Date   KPAFRELGTCHN 205.2 (H) 05/11/2022   LAMBDASER 6.0 05/11/2022   KAPLAMBRATIO 34.20 (H) 05/11/2022   Lab Results  Component Value Date   IGGSERUM 3,801 (H) 05/11/2022   IGGSERUM 3,946 (H) 05/11/2022   IGA 23 (L) 05/11/2022   IGA 27 (L) 05/11/2022   IGMSERUM 17 05/11/2022   IGMSERUM 18 05/11/2022   Lab Results  Component Value Date   TOTALPROTELP 8.1 05/11/2022   ALBUMINELP 3.7 05/11/2022   A1GS 0.2 05/11/2022   A2GS 0.7 05/11/2022   BETS 0.8 05/11/2022   GAMS 2.7 (H) 05/11/2022   MSPIKE 2.5 (H) 05/11/2022   SPEI Comment 02/06/2021     Chemistry      Component Value Date/Time   NA 133 (L) 08/31/2022 0805   NA 139 06/20/2020 0923   K 3.7 08/31/2022 0805    CL 98 08/31/2022 0805   CO2 26 08/31/2022 0805   BUN 15 08/31/2022 0805   BUN 17 06/20/2020 0923   CREATININE 1.09 08/31/2022 0805   CREATININE 1.12 09/03/2015 1201  Component Value Date/Time   CALCIUM 9.3 08/31/2022 0805   ALKPHOS 46 08/31/2022 0805   AST 21 08/31/2022 0805   ALT 26 08/31/2022 0805   BILITOT 0.4 08/31/2022 0805       Impression and Plan: Mr. Scurry is a very pleasant 77 yo caucasian gentleman with smoldering IgG kappa multiple myeloma.  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  We will see what his monoclonal studies look like.  Hopefully, there is still holding steady.  His total protein was 8.8 which is stable.  Again, I suspect that his biggest problem will be his diabetes.  Hopefully, this could get under better control.  We will still plan to get him back to see [place] in another 4 months.      Volanda Napoleon, MD 3/4/20249:10 AM

## 2022-08-31 NOTE — Telephone Encounter (Signed)
MyChart message to patient that he is due for labs for Dr. Loni Muse

## 2022-08-31 NOTE — Telephone Encounter (Signed)
-----   Message from Roetta Sessions, [place] sent at 08/20/2022  5:30 PM EST ----- Regarding: labs due around 3-7 CBC and TIBC ferritin panel due this week

## 2022-09-01 LAB — KAPPA/LAMBDA LIGHT CHAINS
Kappa free light chain: 143.5 mg/L — ABNORMAL HIGH (ref 3.3–19.4)
Kappa, lambda light chain ratio: 20.8 — ABNORMAL HIGH (ref 0.26–1.65)
Lambda free light chains: 6.9 mg/L (ref 5.7–26.3)

## 2022-09-01 LAB — IGG, IGA, IGM
IgA: 23 mg/dL — ABNORMAL LOW (ref 61–437)
IgG (Immunoglobin G), Serum: 3548 mg/dL — ABNORMAL HIGH (ref 603–1613)
IgM (Immunoglobulin M), Srm: 15 mg/dL (ref 15–143)

## 2022-09-08 LAB — PROTEIN ELECTROPHORESIS, SERUM, WITH REFLEX
A/G Ratio: 0.8 (ref 0.7–1.7)
Albumin ELP: 3.8 g/dL (ref 2.9–4.4)
Alpha-1-Globulin: 0.2 g/dL (ref 0.0–0.4)
Alpha-2-Globulin: 0.7 g/dL (ref 0.4–1.0)
Beta Globulin: 0.9 g/dL (ref 0.7–1.3)
Gamma Globulin: 3 g/dL — ABNORMAL HIGH (ref 0.4–1.8)
Globulin, Total: 4.8 g/dL — ABNORMAL HIGH (ref 2.2–3.9)
M-Spike, %: 2.8 g/dL — ABNORMAL HIGH
SPEP Interpretation: 0
Total Protein ELP: 8.6 g/dL — ABNORMAL HIGH (ref 6.0–8.5)

## 2022-09-08 LAB — IMMUNOFIXATION REFLEX, SERUM
IgA: 28 mg/dL — ABNORMAL LOW (ref 61–437)
IgG (Immunoglobin G), Serum: 4798 mg/dL — ABNORMAL HIGH (ref 603–1613)
IgM (Immunoglobulin M), Srm: 19 mg/dL (ref 15–143)

## 2022-09-14 ENCOUNTER — Other Ambulatory Visit (HOSPITAL_COMMUNITY): Payer: Self-pay | Admitting: Family Medicine

## 2022-09-14 ENCOUNTER — Ambulatory Visit (HOSPITAL_COMMUNITY)
Admission: RE | Admit: 2022-09-14 | Discharge: 2022-09-14 | Disposition: A | Discharge status: Home / Self Care | Payer: Medicare Other | Source: Ambulatory Visit | Attending: Surgery | Admitting: Surgery

## 2022-09-14 ENCOUNTER — Encounter: Payer: Self-pay | Admitting: Family

## 2022-09-14 DIAGNOSIS — R0989 Other specified symptoms and signs involving the circulatory and respiratory systems: Secondary | ICD-10-CM

## 2022-12-24 DIAGNOSIS — M25512 Pain in left shoulder: Secondary | ICD-10-CM | POA: Insufficient documentation

## 2022-12-28 ENCOUNTER — Inpatient Hospital Stay (HOSPITAL_BASED_OUTPATIENT_CLINIC_OR_DEPARTMENT_OTHER): Payer: Medicare Other | Admitting: Hematology & Oncology

## 2022-12-28 ENCOUNTER — Other Ambulatory Visit: Payer: Self-pay

## 2022-12-28 ENCOUNTER — Inpatient Hospital Stay: Payer: Medicare Other | Attending: Hematology & Oncology

## 2022-12-28 ENCOUNTER — Encounter: Payer: Self-pay | Admitting: Hematology & Oncology

## 2022-12-28 VITALS — BP 122/51 | HR 69 | Temp 98.5°F | Resp 18 | Ht 68.0 in | Wt 186.0 lb

## 2022-12-28 DIAGNOSIS — Z79899 Other long term (current) drug therapy: Secondary | ICD-10-CM | POA: Insufficient documentation

## 2022-12-28 DIAGNOSIS — Z7982 Long term (current) use of aspirin: Secondary | ICD-10-CM | POA: Insufficient documentation

## 2022-12-28 DIAGNOSIS — E119 Type 2 diabetes mellitus without complications: Secondary | ICD-10-CM | POA: Insufficient documentation

## 2022-12-28 DIAGNOSIS — D472 Monoclonal gammopathy: Secondary | ICD-10-CM

## 2022-12-28 DIAGNOSIS — Z7984 Long term (current) use of oral hypoglycemic drugs: Secondary | ICD-10-CM | POA: Diagnosis not present

## 2022-12-28 DIAGNOSIS — D509 Iron deficiency anemia, unspecified: Secondary | ICD-10-CM | POA: Diagnosis not present

## 2022-12-28 DIAGNOSIS — C9000 Multiple myeloma not having achieved remission: Secondary | ICD-10-CM | POA: Insufficient documentation

## 2022-12-28 LAB — CMP (CANCER CENTER ONLY)
ALT: 16 U/L (ref 0–44)
AST: 16 U/L (ref 15–41)
Albumin: 3.9 g/dL (ref 3.5–5.0)
Alkaline Phosphatase: 47 U/L (ref 38–126)
Anion gap: 8 (ref 5–15)
BUN: 20 mg/dL (ref 8–23)
CO2: 25 mmol/L (ref 22–32)
Calcium: 9.6 mg/dL (ref 8.9–10.3)
Chloride: 99 mmol/L (ref 98–111)
Creatinine: 1.22 mg/dL (ref 0.61–1.24)
GFR, Estimated: >60 mL/min (ref >60)
Glucose, Bld: 259 mg/dL — ABNORMAL HIGH (ref 70–99)
Potassium: 3.8 mmol/L (ref 3.5–5.1)
Sodium: 132 mmol/L — ABNORMAL LOW (ref 135–145)
Total Bilirubin: 0.5 mg/dL (ref 0.3–1.2)
Total Protein: 9.3 g/dL — ABNORMAL HIGH (ref 6.5–8.1)

## 2022-12-28 LAB — CBC WITH DIFFERENTIAL (CANCER CENTER ONLY)
Abs Immature Granulocytes: 0.01 10*3/uL (ref 0.00–0.07)
Basophils Absolute: 0 10*3/uL (ref 0.0–0.1)
Basophils Relative: 0 %
Eosinophils Absolute: 0.1 10*3/uL (ref 0.0–0.5)
Eosinophils Relative: 2 %
HCT: 40 % (ref 39.0–52.0)
Hemoglobin: 13.5 g/dL (ref 13.0–17.0)
Immature Granulocytes: 0 %
Lymphocytes Relative: 35 %
Lymphs Abs: 1.7 10*3/uL (ref 0.7–4.0)
MCH: 30.1 pg (ref 26.0–34.0)
MCHC: 33.8 g/dL (ref 30.0–36.0)
MCV: 89.3 fL (ref 80.0–100.0)
Monocytes Absolute: 0.4 10*3/uL (ref 0.1–1.0)
Monocytes Relative: 9 %
Neutro Abs: 2.6 10*3/uL (ref 1.7–7.7)
Neutrophils Relative %: 54 %
Platelet Count: 184 10*3/uL (ref 150–400)
RBC: 4.48 MIL/uL (ref 4.22–5.81)
RDW: 12.8 % (ref 11.5–15.5)
WBC Count: 4.8 10*3/uL (ref 4.0–10.5)
nRBC: 0 % (ref 0.0–0.2)

## 2022-12-28 LAB — LACTATE DEHYDROGENASE: LDH: 115 U/L (ref 98–192)

## 2022-12-28 NOTE — Progress Notes (Signed)
Hematology and Oncology Follow Up Visit  [name] [id number] 1945/08/22 77 y.o. 12/28/2022   Principle Diagnosis:  Smoldering IgG kappa multiple myeloma, high risk  Incidental exophytic lesion of R kidney Intermittent iron deficiency anemia   Current Therapy:   IV iron  - Ferahame given on 12/29/2021 On observation     Interim History:  [name] is here today for follow-up.  We saw him back in March.  At that time, his monoclonal spike was 2.8 g/dL.  The IgG level was about 4000 mg/dL.  His Kappa light chain was 14.3 mg/dL.  He still feeling well.  His diabetes is biggest problem.  This morning, his blood sugar was 259.  He now is on Jardiance.  He has had no issues with fever.  He has had no problem with infections.  He has had no change in bowel or bladder habits.  He is going go down to the [facility] for July 4 weekend.  I told make sure to wear a lot of sunscreen and to hydrate.  He has had no visual changes.  There is been no neuropathy.  Overall, I would say that his performance status is probably ECOG 1.    Medications:  Allergies as of 12/28/2022       Reactions   Clindamycin Hcl Itching   Metoprolol Succinate Other (See Comments)   Pt does not recall.        Medication List        Accurate as of December 28, 2022  8:43 AM. If you have any questions, ask your nurse or doctor.          STOP taking these medications    Trulicity 1.5 MG/0.5ML Sopn Generic drug: Dulaglutide Stopped by: Josph Macho, MD       TAKE these medications    amLODipine 10 MG tablet Commonly known as: NORVASC Take 10 mg by mouth every morning.   aspirin EC 81 MG tablet Take 1 tablet (81 mg total) by mouth daily. Swallow whole.   ezetimibe 10 MG tablet Commonly known as: ZETIA Take 10 mg by mouth daily.   furosemide 20 MG tablet Commonly known as: LASIX Take 20 mg by mouth daily.   glipiZIDE 2.5 MG 24 hr tablet Commonly known as: GLUCOTROL XL Take 2.5 mg by mouth  every morning.   metFORMIN 500 MG 24 hr tablet Commonly known as: GLUCOPHAGE-XR Take 4 tablets by mouth daily.   omeprazole 20 MG capsule Commonly known as: PRILOSEC Take 20 mg by mouth 2 (two) times daily before a meal.   Ozempic (0.25 or 0.5 MG/DOSE) 2 MG/3ML Sopn Generic drug: Semaglutide(0.25 or 0.5MG /DOS) Inject into the skin.   rosuvastatin 40 MG tablet Commonly known as: CRESTOR Take 40 mg by mouth daily.   Testosterone 25 MG/2.5GM (1%) Gel Place 1 mg onto the skin every other day.   triamterene-hydrochlorothiazide 37.5-25 MG tablet Commonly known as: MAXZIDE-25 Take 1 tablet by mouth every morning.   valsartan 320 MG tablet Commonly known as: DIOVAN Take 320 mg by mouth daily.        Allergies:  Allergies  Allergen Reactions   Clindamycin Hcl Itching   Metoprolol Succinate Other (See Comments)    Pt does not recall.    Past Medical History, Surgical history, Social history, and Family History were reviewed and updated.  Review of Systems: Review of Systems  Constitutional: Negative.   HENT: Negative.    Eyes: Negative.   Respiratory: Negative.  Cardiovascular: Negative.   Gastrointestinal: Negative.   Genitourinary: Negative.   Musculoskeletal: Negative.   Skin: Negative.   Neurological: Negative.   Endo/Heme/Allergies: Negative.   Psychiatric/Behavioral: Negative.        Physical Exam:  height is 5\' 8"  (1.727 m) and weight is 186 lb (84.4 kg). His oral temperature is 98.5 F (36.9 C). His blood pressure is 122/51 (abnormal) and his pulse is 69. His respiration is 18 and oxygen saturation is 99%.    Wt Readings from Last 3 Encounters:  12/28/22 186 lb (84.4 kg)  08/31/22 188 lb (85.3 kg)  08/18/22 184 lb (83.5 kg)    Physical Exam Vitals reviewed.  HENT:     Head: Normocephalic and atraumatic.  Eyes:     Pupils: Pupils are equal, round, and reactive to light.  Cardiovascular:     Rate and Rhythm: Normal rate and regular rhythm.      Heart sounds: Normal heart sounds.  Pulmonary:     Effort: Pulmonary effort is normal.     Breath sounds: Normal breath sounds.  Abdominal:     General: Bowel sounds are normal.     Palpations: Abdomen is soft.  Musculoskeletal:        General: No tenderness or deformity. Normal range of motion.     Cervical back: Normal range of motion.  Lymphadenopathy:     Cervical: No cervical adenopathy.  Skin:    General: Skin is warm and dry.     Findings: No erythema or rash.  Neurological:     Mental Status: He is alert and oriented to person, place, and time.  Psychiatric:        Behavior: Behavior normal.        Thought Content: Thought content normal.        Judgment: Judgment normal.      Lab Results  Component Value Date   WBC 4.8 12/28/2022   HGB 13.5 12/28/2022   HCT 40.0 12/28/2022   MCV 89.3 12/28/2022   PLT 184 12/28/2022   Lab Results  Component Value Date   FERRITIN 46 08/31/2022   IRON 86 08/31/2022   TIBC 395 08/31/2022   UIBC 309 08/31/2022   IRONPCTSAT 22 08/31/2022   Lab Results  Component Value Date   RETICCTPCT 2.3 08/31/2022   RBC 4.48 12/28/2022   Lab Results  Component Value Date   KPAFRELGTCHN 143.5 (H) 08/31/2022   LAMBDASER 6.9 08/31/2022   KAPLAMBRATIO 20.80 (H) 08/31/2022   Lab Results  Component Value Date   IGGSERUM 4,798 (H) 08/31/2022   IGA 28 (L) 08/31/2022   IGMSERUM 19 08/31/2022   Lab Results  Component Value Date   TOTALPROTELP 8.6 (H) 08/31/2022   ALBUMINELP 3.8 08/31/2022   A1GS 0.2 08/31/2022   A2GS 0.7 08/31/2022   BETS 0.9 08/31/2022   GAMS 3.0 (H) 08/31/2022   MSPIKE 2.8 (H) 08/31/2022   SPEI Comment 02/06/2021     Chemistry      Component Value Date/Time   NA 132 (L) 12/28/2022 0804   NA 139 06/20/2020 0923   K 3.8 12/28/2022 0804   CL 99 12/28/2022 0804   CO2 25 12/28/2022 0804   BUN 20 12/28/2022 0804   BUN 17 06/20/2020 0923   CREATININE 1.22 12/28/2022 0804   CREATININE 1.12 09/03/2015 1201       Component Value Date/Time   CALCIUM 9.6 12/28/2022 0804   ALKPHOS 47 12/28/2022 0804   AST 16 12/28/2022 0804   ALT 16  12/28/2022 0804   BILITOT 0.5 12/28/2022 0804       Impression and Plan: [name] is a very pleasant 77 yo caucasian gentleman with smoldering IgG kappa multiple myeloma.  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  It will be interesting to see what his monoclonal studies look like.  We are getting close to the point where we have to treat him.  It would be nice if his blood sugars were under better control.  Hopefully, having him on Jardiance will help.  I think we can still get him through the Summer.  I will plan to see him back in September.  If we find that his monoclonal studies are higher, they are going to have to consider him for therapy.     Josph Macho, MD 7/1/20248:43 AM

## 2022-12-29 LAB — IGG, IGA, IGM
IgA: 25 mg/dL — ABNORMAL LOW (ref 61–437)
IgG (Immunoglobin G), Serum: 4222 mg/dL — ABNORMAL HIGH (ref 603–1613)
IgM (Immunoglobulin M), Srm: 21 mg/dL (ref 15–143)

## 2022-12-29 LAB — KAPPA/LAMBDA LIGHT CHAINS
Kappa free light chain: 276.7 mg/L — ABNORMAL HIGH (ref 3.3–19.4)
Kappa, lambda light chain ratio: 29.13 — ABNORMAL HIGH (ref 0.26–1.65)
Lambda free light chains: 9.5 mg/L (ref 5.7–26.3)

## 2023-01-04 LAB — PROTEIN ELECTROPHORESIS, SERUM, WITH REFLEX
A/G Ratio: 0.8 (ref 0.7–1.7)
Albumin ELP: 3.7 g/dL (ref 2.9–4.4)
Alpha-1-Globulin: 0.2 g/dL (ref 0.0–0.4)
Alpha-2-Globulin: 0.7 g/dL (ref 0.4–1.0)
Beta Globulin: 0.9 g/dL (ref 0.7–1.3)
Gamma Globulin: 2.9 g/dL — ABNORMAL HIGH (ref 0.4–1.8)
Globulin, Total: 4.8 g/dL — ABNORMAL HIGH (ref 2.2–3.9)
M-Spike, %: 2.5 g/dL — ABNORMAL HIGH
SPEP Interpretation: 0
Total Protein ELP: 8.5 g/dL (ref 6.0–8.5)

## 2023-01-04 LAB — IMMUNOFIXATION REFLEX, SERUM
IgA: 25 mg/dL — ABNORMAL LOW (ref 61–437)
IgG (Immunoglobin G), Serum: 4652 mg/dL — ABNORMAL HIGH (ref 603–1613)
IgM (Immunoglobulin M), Srm: 19 mg/dL (ref 15–143)

## 2023-01-13 DIAGNOSIS — M189 Osteoarthritis of first carpometacarpal joint, unspecified: Secondary | ICD-10-CM | POA: Insufficient documentation

## 2023-01-13 DIAGNOSIS — M79641 Pain in right hand: Secondary | ICD-10-CM | POA: Insufficient documentation

## 2023-01-13 DIAGNOSIS — M654 Radial styloid tenosynovitis [de Quervain]: Secondary | ICD-10-CM | POA: Insufficient documentation

## 2023-02-03 ENCOUNTER — Other Ambulatory Visit: Payer: Self-pay | Admitting: Hematology & Oncology

## 2023-02-03 DIAGNOSIS — D472 Monoclonal gammopathy: Secondary | ICD-10-CM

## 2023-03-05 ENCOUNTER — Other Ambulatory Visit: Payer: Self-pay | Admitting: Physician Assistant

## 2023-03-05 DIAGNOSIS — Z01818 Encounter for other preprocedural examination: Secondary | ICD-10-CM

## 2023-03-07 NOTE — Consult Note (Signed)
Chief Complaint: Patient was seen in consultation today for  CT guided bone marrow biopsy  Referring Physician(s): Ennever,Peter R  Supervising Physician: Mir, Mauri Reading  Patient Status: [facility] - Out-pt  History of Present Illness: [name] is a 77 y.o. male with PMH sig for anemia, arthritis, BPH, CAD with prior MI/stenting, DM, GERD, HLD , TAA who presents now with smoldering IgG kappa multiple myeloma. He is scheduled today for staging CT guided bone marrow biopsy.   Past Medical History:  Diagnosis Date   Anemia    Arthritis    BPH (benign prostatic hyperplasia)    CAD (coronary artery disease)    Collagen vascular disease (HCC)    Colon polyp 2019   tubular adenomas   Diabetes mellitus without complication (HCC)    Esophageal reflux    Fe deficiency anemia 2012   FHx: colonic polyps 11/04, 01/2009   Gastritis    Hearing loss    wears hearing aid   Heart attack (HCC)    Hemorrhoids, internal    Hyperlipidemia    Hypertension    Hypogonadism male    Smoldering myeloma    Wears glasses     Past Surgical History:  Procedure Laterality Date   APPENDECTOMY     CORONARY ANGIOPLASTY WITH STENT PLACEMENT  2007   HAND SURGERY     left 2010, right 2012   KNEE SURGERY  1994   left   left knee     x3   left shoulder     PROSTATE SURGERY     Right CTS     ?   ROTATOR CUFF REPAIR Right 12/2012   TOTAL KNEE ARTHROPLASTY Right 03/08/2015   Procedure: RIGHT TOTAL KNEE ARTHROPLASTY;  Surgeon: Eugenia Mcalpine, MD;  Location: [facility] OR[facility];  Service: Orthopedics;  Laterality: Right;   VASECTOMY      Allergies: Clindamycin hcl and Metoprolol succinate  Medications: Prior to Admission medications   Medication Sig Start Date End Date Taking? Authorizing Provider  amLODipine (NORVASC) 10 MG tablet Take 10 mg by mouth every morning. 11/10/19   [provider]  aspirin EC 81 MG tablet Take 1 tablet (81 mg total) by mouth daily. Swallow whole. 08/01/21   Duke Salvia, MD   ezetimibe (ZETIA) 10 MG tablet Take 10 mg by mouth daily. 03/15/20   [provider]  furosemide (LASIX) 20 MG tablet Take 20 mg by mouth daily. 08/01/21   [provider]  glipiZIDE (GLUCOTROL XL) 2.5 MG 24 hr tablet Take 2.5 mg by mouth every morning. 11/14/21   [provider]  metFORMIN (GLUCOPHAGE-XR) 500 MG 24 hr tablet Take 4 tablets by mouth daily. 02/13/19   [provider]  omeprazole (PRILOSEC) 20 MG capsule Take 20 mg by mouth 2 (two) times daily before a meal.     [provider]  OZEMPIC, 0.25 OR 0.5 MG/DOSE, 2 MG/3ML SOPN Inject into the skin. 08/26/22   [provider]  rosuvastatin (CRESTOR) 40 MG tablet Take 40 mg by mouth daily. 02/12/20   [provider]  Testosterone 25 MG/2.5GM (1%) GEL Place 1 mg onto the skin every other day.    [provider]  triamterene-hydrochlorothiazide (MAXZIDE-25) 37.5-25 MG tablet Take 1 tablet by mouth every morning. 11/13/21   [provider]  valsartan (DIOVAN) 320 MG tablet Take 320 mg by mouth daily.    [provider]     Family History  Problem Relation Age of Onset   Heart disease  Mother    Cancer Father    Other Sister        brain tumor   Cancer Sister    Stroke Brother    Colon polyps Other    Liver disease Other    Colon cancer Neg Hx    Esophageal cancer Neg Hx    Rectal cancer Neg Hx    Stomach cancer Neg Hx     Social History   Socioeconomic History   Marital status: Widowed    Spouse name: Not on file   Number of children: Not on file   Years of education: Not on file   Highest education level: Not on file  Occupational History   Occupation: Operations Manager--[employer]    Employer: [employer]  Tobacco Use   Smoking status: Former    Current packs/day: 0.00    Average packs/day: 3.0 packs/day for 15.0 years (45.0 ttl pk-yrs)    Types: Cigarettes    Start date: 02/18/1969    Quit date: 02/19/1984    Years since quitting:  39.0   Smokeless tobacco: Never  Vaping Use   Vaping status: Never Used  Substance and Sexual Activity   Alcohol use: Yes    Alcohol/week: 1.0 standard drink of alcohol    Types: 1 Glasses of wine per week    Comment: occasional glass of wine    Drug use: No   Sexual activity: Yes  Other Topics Concern   Not on file  Social History Narrative   ** Merged History Encounter **       Social Determinants of Health   Financial Resource Strain: Not on file  Food Insecurity: Not on file  Transportation Needs: Not on file  Physical Activity: Not on file  Stress: Not on file  Social Connections: Not on file      Review of Systems  Vital Signs:  Code Status:   Advance Care Plan: {Advance Care WUXL:24401}    Physical Exam  Imaging: No results found.  Labs:  CBC: Recent Labs    05/11/22 0747 08/31/22 0805 12/28/22 0804  WBC 3.3* 4.2 4.8  HGB 13.3 12.5* 13.5  HCT 38.8* 35.9* 40.0  PLT 198 212 184    COAGS: No results for input(s): "INR", "APTT" in the last 8760 hours.  BMP: Recent Labs    05/11/22 0747 08/31/22 0805 12/28/22 0804  NA 134* 133* 132*  K 3.6 3.7 3.8  CL 99 98 99  CO2 28 26 25   GLUCOSE 259* 239* 259*  BUN 19 15 20   CALCIUM 9.5 9.3 9.6  CREATININE 1.24 1.09 1.22  GFRNONAA 60* >60 >60    LIVER FUNCTION TESTS: Recent Labs    05/11/22 0747 08/31/22 0805 12/28/22 0804  BILITOT 0.5 0.4 0.5  AST 17 21 16   ALT 21 26 16   ALKPHOS 45 46 47  PROT 8.9* 8.8* 9.3*  ALBUMIN 3.9 4.0 3.9    TUMOR MARKERS: No results for input(s): "AFPTM", "CEA", "CA199", "CHROMGRNA" in the last 8760 hours.  Assessment and Plan: 77 y.o. male with PMH sig for anemia, arthritis, BPH, CAD with prior MI/stenting, DM, GERD, HLD ,TAA who presents now with smoldering IgG kappa multiple myeloma. He is scheduled today for staging CT guided bone marrow biopsy.Risks and benefits of procedure was discussed with the patient and/or patient's family including, but not  limited to bleeding, infection, damage to adjacent structures or low yield requiring additional tests.  All of the questions were answered and there is agreement  to proceed.  Consent signed and in chart.    Thank you for this interesting consult.  I greatly enjoyed meeting Myaire Wing and look forward to participating in their care.  A copy of this report was sent to the requesting provider on this date.  Electronically Signed: D. Jeananne Rama, PA-C 03/07/2023, 4:59 PM   I spent a total of  20 minutes   in face to face in clinical consultation, greater than 50% of which was counseling/coordinating care for CT guided bone marrow biopsy

## 2023-03-08 ENCOUNTER — Ambulatory Visit (HOSPITAL_COMMUNITY)
Admission: RE | Admit: 2023-03-08 | Discharge: 2023-03-08 | Disposition: A | Discharge status: Home / Self Care | Payer: Medicare Other | Source: Ambulatory Visit | Attending: Hematology & Oncology | Admitting: Hematology & Oncology

## 2023-03-08 ENCOUNTER — Encounter (HOSPITAL_COMMUNITY): Payer: Self-pay

## 2023-03-08 ENCOUNTER — Other Ambulatory Visit: Payer: Self-pay

## 2023-03-08 DIAGNOSIS — D472 Monoclonal gammopathy: Secondary | ICD-10-CM | POA: Insufficient documentation

## 2023-03-08 DIAGNOSIS — C9000 Multiple myeloma not having achieved remission: Secondary | ICD-10-CM | POA: Insufficient documentation

## 2023-03-08 DIAGNOSIS — Z01818 Encounter for other preprocedural examination: Secondary | ICD-10-CM

## 2023-03-08 DIAGNOSIS — D539 Nutritional anemia, unspecified: Secondary | ICD-10-CM | POA: Insufficient documentation

## 2023-03-08 DIAGNOSIS — Z1379 Encounter for other screening for genetic and chromosomal anomalies: Secondary | ICD-10-CM | POA: Insufficient documentation

## 2023-03-08 LAB — CBC WITH DIFFERENTIAL/PLATELET
Abs Immature Granulocytes: 0.01 10*3/uL (ref 0.00–0.07)
Basophils Absolute: 0 10*3/uL (ref 0.0–0.1)
Basophils Relative: 1 %
Eosinophils Absolute: 0.1 10*3/uL (ref 0.0–0.5)
Eosinophils Relative: 3 %
HCT: 39.1 % (ref 39.0–52.0)
Hemoglobin: 13.1 g/dL (ref 13.0–17.0)
Immature Granulocytes: 0 %
Lymphocytes Relative: 35 %
Lymphs Abs: 1.3 10*3/uL (ref 0.7–4.0)
MCH: 30 pg (ref 26.0–34.0)
MCHC: 33.5 g/dL (ref 30.0–36.0)
MCV: 89.7 fL (ref 80.0–100.0)
Monocytes Absolute: 0.4 10*3/uL (ref 0.1–1.0)
Monocytes Relative: 10 %
Neutro Abs: 1.8 10*3/uL (ref 1.7–7.7)
Neutrophils Relative %: 51 %
Platelets: 211 10*3/uL (ref 150–400)
RBC: 4.36 MIL/uL (ref 4.22–5.81)
RDW: 13.4 % (ref 11.5–15.5)
WBC: 3.6 10*3/uL — ABNORMAL LOW (ref 4.0–10.5)
nRBC: 0 % (ref 0.0–0.2)

## 2023-03-08 LAB — GLUCOSE, CAPILLARY: Glucose-Capillary: 189 mg/dL — ABNORMAL HIGH (ref 70–99)

## 2023-03-08 MED ORDER — MIDAZOLAM HCL 2 MG/2ML IJ SOLN
INTRAMUSCULAR | Status: AC | PRN
  Administered 2023-03-08 (×4): 1 mg via INTRAVENOUS

## 2023-03-08 MED ORDER — SODIUM CHLORIDE 0.9 % IV SOLN: INTRAVENOUS | Status: DC

## 2023-03-08 MED ORDER — FENTANYL CITRATE (PF) 100 MCG/2ML IJ SOLN
INTRAMUSCULAR | Status: AC | PRN
  Administered 2023-03-08 (×2): 50 ug via INTRAVENOUS

## 2023-03-08 MED ORDER — FENTANYL CITRATE (PF) 100 MCG/2ML IJ SOLN
INTRAMUSCULAR | Status: AC
  Filled 2023-03-08: qty 2

## 2023-03-08 MED ORDER — MIDAZOLAM HCL 2 MG/2ML IJ SOLN
INTRAMUSCULAR | Status: AC
  Filled 2023-03-08: qty 4

## 2023-03-08 NOTE — Discharge Instructions (Signed)
Please call Interventional Radiology clinic [phone] with any questions or concerns. ? ?You may remove your dressing and shower tomorrow. ? ? ?Bone Marrow Aspiration and Bone Marrow Biopsy, Adult, Care After ?This sheet gives you information about how to care for yourself after your procedure. Your health care provider may also give you more specific instructions. If you have problems or questions, contact your health care provider. ?What can I expect after the procedure? ?After the procedure, it is common to have: ?Mild pain and tenderness. ?Swelling. ?Bruising. ?Follow these instructions at home: ?Puncture site care ?Follow instructions from your health care provider about how to take care of the puncture site. Make sure you: ?Wash your hands with soap and water before and after you change your bandage (dressing). If soap and water are not available, use hand sanitizer. ?Change your dressing as told by your health care provider. ?Check your puncture site every day for signs of infection. Check for: ?More redness, swelling, or pain. ?Fluid or blood. ?Warmth. ?Pus or a bad smell.   ?Activity ?Return to your normal activities as told by your health care provider. Ask your health care provider what activities are safe for you. ?Do not lift anything that is heavier than 10 lb (4.5 kg), or the limit that you are told, until your health care provider says that it is safe. ?Do not drive for 24 hours if you were given a sedative during your procedure. ?General instructions ?Take over-the-counter and prescription medicines only as told by your health care provider. ?Do not take baths, swim, or use a hot tub until your health care provider approves. Ask your health care provider if you may take showers. You may only be allowed to take sponge baths. ?If directed, put ice on the affected area. To do this: ?Put ice in a plastic bag. ?Place a towel between your skin and the bag. ?Leave the ice on for 20 minutes, 2-3 times a  day. ?Keep all follow-up visits as told by your health care provider. This is important.   ?Contact a health care provider if: ?Your pain is not controlled with medicine. ?You have a fever. ?You have more redness, swelling, or pain around the puncture site. ?You have fluid or blood coming from the puncture site. ?Your puncture site feels warm to the touch. ?You have pus or a bad smell coming from the puncture site. ?Summary ?After the procedure, it is common to have mild pain, tenderness, swelling, and bruising. ?Follow instructions from your health care provider about how to take care of the puncture site and what activities are safe for you. ?Take over-the-counter and prescription medicines only as told by your health care provider. ?Contact a health care provider if you have any signs of infection, such as fluid or blood coming from the puncture site. ?This information is not intended to replace advice given to you by your health care provider. Make sure you discuss any questions you have with your health care provider. ?Document Revised: 11/01/2018 Document Reviewed: 11/01/2018 ?Elsevier Patient Education ? 2021 Elsevier Inc. ? ? ?Moderate Conscious Sedation, Adult, Care After ?This sheet gives you information about how to care for yourself after your procedure. Your health care provider may also give you more specific instructions. If you have problems or questions, contact your health care provider. ?What can I expect after the procedure? ?After the procedure, it is common to have: ?Sleepiness for several hours. ?Impaired judgment for several hours. ?Difficulty with balance. ?Vomiting if you eat too   soon. ?Follow these instructions at home: ?For the time period you were told by your health care provider: ?Rest. ?Do not participate in activities where you could fall or become injured. ?Do not drive or use machinery. ?Do not drink alcohol. ?Do not take sleeping pills or medicines that cause drowsiness. ?Do not  make important decisions or sign legal documents. ?Do not take care of children on your own.  ?  ?  ?Eating and drinking ?Follow the diet recommended by your health care provider. ?Drink enough fluid to keep your urine pale yellow. ?If you vomit: ?Drink water, juice, or soup when you can drink without vomiting. ?Make sure you have little or no nausea before eating solid foods.   ?General instructions ?Take over-the-counter and prescription medicines only as told by your health care provider. ?Have a responsible adult stay with you for the time you are told. It is important to have someone help care for you until you are awake and alert. ?Do not smoke. ?Keep all follow-up visits as told by your health care provider. This is important. ?Contact a health care provider if: ?You are still sleepy or having trouble with balance after 24 hours. ?You feel light-headed. ?You keep feeling nauseous or you keep vomiting. ?You develop a rash. ?You have a fever. ?You have redness or swelling around the IV site. ?Get help right away if: ?You have trouble breathing. ?You have new-onset confusion at home. ?Summary ?After the procedure, it is common to feel sleepy, have impaired judgment, or feel nauseous if you eat too soon. ?Rest after you get home. Know the things you should not do after the procedure. ?Follow the diet recommended by your health care provider and drink enough fluid to keep your urine pale yellow. ?Get help right away if you have trouble breathing or new-onset confusion at home. ?This information is not intended to replace advice given to you by your health care provider. Make sure you discuss any questions you have with your health care provider. ?Document Revised: 10/13/2019 Document Reviewed: 05/11/2019 ?Elsevier Patient Education ? 2021 Elsevier Inc.  ?

## 2023-03-08 NOTE — Procedures (Signed)
Interventional Radiology Procedure Note  Indication: Smoldering IgG kappa multiple myeloma   Procedure: CT guided aspirate and core biopsy of right iliac bone  Complications: None  Bleeding: Minimal  Acquanetta Belling, MD [phone]

## 2023-03-10 LAB — SURGICAL PATHOLOGY

## 2023-03-15 ENCOUNTER — Encounter: Payer: Self-pay | Admitting: *Deleted

## 2023-03-15 ENCOUNTER — Encounter: Payer: Self-pay | Admitting: Hematology & Oncology

## 2023-03-15 ENCOUNTER — Inpatient Hospital Stay (HOSPITAL_BASED_OUTPATIENT_CLINIC_OR_DEPARTMENT_OTHER): Payer: Medicare Other | Admitting: Hematology & Oncology

## 2023-03-15 ENCOUNTER — Inpatient Hospital Stay: Payer: Medicare Other | Attending: Hematology & Oncology

## 2023-03-15 VITALS — BP 143/77 | HR 65 | Temp 98.0°F | Resp 17 | Ht 68.0 in | Wt 183.0 lb

## 2023-03-15 DIAGNOSIS — C9000 Multiple myeloma not having achieved remission: Secondary | ICD-10-CM | POA: Insufficient documentation

## 2023-03-15 DIAGNOSIS — D472 Monoclonal gammopathy: Secondary | ICD-10-CM

## 2023-03-15 DIAGNOSIS — Z79899 Other long term (current) drug therapy: Secondary | ICD-10-CM | POA: Diagnosis not present

## 2023-03-15 DIAGNOSIS — D509 Iron deficiency anemia, unspecified: Secondary | ICD-10-CM | POA: Diagnosis not present

## 2023-03-15 DIAGNOSIS — Z7984 Long term (current) use of oral hypoglycemic drugs: Secondary | ICD-10-CM | POA: Diagnosis not present

## 2023-03-15 DIAGNOSIS — E119 Type 2 diabetes mellitus without complications: Secondary | ICD-10-CM | POA: Insufficient documentation

## 2023-03-15 HISTORY — DX: Multiple myeloma not having achieved remission: C90.00

## 2023-03-15 LAB — CBC WITH DIFFERENTIAL (CANCER CENTER ONLY)
Abs Immature Granulocytes: 0.01 10*3/uL (ref 0.00–0.07)
Basophils Absolute: 0 10*3/uL (ref 0.0–0.1)
Basophils Relative: 0 %
Eosinophils Absolute: 0.1 10*3/uL (ref 0.0–0.5)
Eosinophils Relative: 2 %
HCT: 39.9 % (ref 39.0–52.0)
Hemoglobin: 12.9 g/dL — ABNORMAL LOW (ref 13.0–17.0)
Immature Granulocytes: 0 %
Lymphocytes Relative: 34 %
Lymphs Abs: 1.4 10*3/uL (ref 0.7–4.0)
MCH: 29.4 pg (ref 26.0–34.0)
MCHC: 32.3 g/dL (ref 30.0–36.0)
MCV: 90.9 fL (ref 80.0–100.0)
Monocytes Absolute: 0.3 10*3/uL (ref 0.1–1.0)
Monocytes Relative: 7 %
Neutro Abs: 2.3 10*3/uL (ref 1.7–7.7)
Neutrophils Relative %: 57 %
Platelet Count: 192 10*3/uL (ref 150–400)
RBC: 4.39 MIL/uL (ref 4.22–5.81)
RDW: 13.3 % (ref 11.5–15.5)
WBC Count: 4 10*3/uL (ref 4.0–10.5)
nRBC: 0 % (ref 0.0–0.2)

## 2023-03-15 LAB — CMP (CANCER CENTER ONLY)
ALT: 19 U/L (ref 0–44)
AST: 25 U/L (ref 15–41)
Albumin: 4.1 g/dL (ref 3.5–5.0)
Alkaline Phosphatase: 39 U/L (ref 38–126)
Anion gap: 7 (ref 5–15)
BUN: 21 mg/dL (ref 8–23)
CO2: 29 mmol/L (ref 22–32)
Calcium: 9.7 mg/dL (ref 8.9–10.3)
Chloride: 99 mmol/L (ref 98–111)
Creatinine: 1.16 mg/dL (ref 0.61–1.24)
GFR, Estimated: >60 mL/min (ref >60)
Glucose, Bld: 310 mg/dL — ABNORMAL HIGH (ref 70–99)
Potassium: 4.1 mmol/L (ref 3.5–5.1)
Sodium: 135 mmol/L (ref 135–145)
Total Bilirubin: 0.4 mg/dL (ref 0.3–1.2)
Total Protein: 9.2 g/dL — ABNORMAL HIGH (ref 6.5–8.1)

## 2023-03-15 LAB — LACTATE DEHYDROGENASE: LDH: 129 U/L (ref 98–192)

## 2023-03-15 NOTE — Progress Notes (Signed)
Hematology and Oncology Follow Up Visit  [name] [id number] 09/25/1945 77 y.o. 03/15/2023   Principle Diagnosis:   IgG kappa multiple myeloma,Incidental exophytic lesion of R kidney Intermittent iron deficiency anemia   Current Therapy:   DVR -- start cycle #1 on 03/29/2023 IV iron  - Ferahame given on 12/29/2021 On observation     Interim History:  [name] is here today for follow-up.  Unfortunately, I think he does qualify for myeloma now.  He did have a bone marrow biopsy done on 03/08/2023.  The pathology report ([id number]) showed a hypercellular marrow.  There is about for 30% involvement by myeloma cells.  Unfortunately, I think this is just too high of a value for [place] not to treat.  We saw him back in July, his myeloma studies showed M spike 2.5 g/dL.  His IgG level was about 4400 mg/dL.  The Kappa light chain was 27.7 mg/dL.  We are probably will have to get a PET scan on him.  I also probably would like to get a 24-hour urine on him.  He feels well.  He goes to the beach all the time.  He wants his quality life to be a priority.  I told him that I thought we could treat him easily enough.  I think that the treatment would be easier to tolerate and has about 90% of working.  I would probably use daratumumab/Velcade/Revlimid.  I think this would be a good protocol for him.  He has diabetes so I would hold off on Decadron.  He does not complain of any bony pain.  His urine is doing okay.  He has had no problems with urinary frequency.  Has been no problems with his bowels.  He has had no cough or shortness of breath.  He has had no rashes.  Is been no bleeding.  He has had no leg swelling.  Overall, I would say that his performance status is probably ECOG 1.   Medications:  Allergies as of 03/15/2023       Reactions   Clindamycin Hcl Itching   Metoprolol Succinate Other (See Comments)   Pt does not recall.        Medication List        Accurate  as of March 15, 2023 11:15 AM. If you have any questions, ask your nurse or doctor.          amLODipine 10 MG tablet Commonly known as: NORVASC Take 10 mg by mouth every morning.   aspirin EC 81 MG tablet Take 1 tablet (81 mg total) by mouth daily. Swallow whole.   ezetimibe 10 MG tablet Commonly known as: ZETIA Take 10 mg by mouth daily.   furosemide 20 MG tablet Commonly known as: LASIX Take 20 mg by mouth daily.   glipiZIDE 2.5 MG 24 hr tablet Commonly known as: GLUCOTROL XL Take 2.5 mg by mouth every morning.   metFORMIN 500 MG 24 hr tablet Commonly known as: GLUCOPHAGE-XR Take 4 tablets by mouth daily.   omeprazole 20 MG capsule Commonly known as: PRILOSEC Take 20 mg by mouth 2 (two) times daily before a meal.   Ozempic (0.25 or 0.5 MG/DOSE) 2 MG/3ML Sopn Generic drug: Semaglutide(0.25 or 0.5MG /DOS) Inject into the skin.   rosuvastatin 40 MG tablet Commonly known as: CRESTOR Take 40 mg by mouth daily.   Testosterone 25 MG/2.5GM (1%) Gel Place 1 mg onto the skin every other day.   triamterene-hydrochlorothiazide 37.5-25 MG tablet Commonly known as:  MAXZIDE-25 Take 1 tablet by mouth every morning.   valsartan 320 MG tablet Commonly known as: DIOVAN Take 320 mg by mouth daily.        Allergies:  Allergies  Allergen Reactions   Clindamycin Hcl Itching   Metoprolol Succinate Other (See Comments)    Pt does not recall.    Past Medical History, Surgical history, Social history, and Family History were reviewed and updated.  Review of Systems: Review of Systems  Constitutional: Negative.   HENT: Negative.    Eyes: Negative.   Respiratory: Negative.    Cardiovascular: Negative.   Gastrointestinal: Negative.   Genitourinary: Negative.   Musculoskeletal: Negative.   Skin: Negative.   Neurological: Negative.   Endo/Heme/Allergies: Negative.   Psychiatric/Behavioral: Negative.        Physical Exam:  height is 5\' 8"  (1.727 m) and weight  is 183 lb (83 kg). His oral temperature is 98 F (36.7 C). His blood pressure is 143/77 (abnormal) and his pulse is 65. His respiration is 17 and oxygen saturation is 99%.    Wt Readings from Last 3 Encounters:  03/15/23 183 lb (83 kg)  03/08/23 180 lb (81.6 kg)  12/28/22 186 lb (84.4 kg)    Physical Exam Vitals reviewed.  HENT:     Head: Normocephalic and atraumatic.  Eyes:     Pupils: Pupils are equal, round, and reactive to light.  Cardiovascular:     Rate and Rhythm: Normal rate and regular rhythm.     Heart sounds: Normal heart sounds.  Pulmonary:     Effort: Pulmonary effort is normal.     Breath sounds: Normal breath sounds.  Abdominal:     General: Bowel sounds are normal.     Palpations: Abdomen is soft.  Musculoskeletal:        General: No tenderness or deformity. Normal range of motion.     Cervical back: Normal range of motion.  Lymphadenopathy:     Cervical: No cervical adenopathy.  Skin:    General: Skin is warm and dry.     Findings: No erythema or rash.  Neurological:     Mental Status: He is alert and oriented to person, place, and time.  Psychiatric:        Behavior: Behavior normal.        Thought Content: Thought content normal.        Judgment: Judgment normal.     Lab Results  Component Value Date   WBC 4.0 03/15/2023   HGB 12.9 (L) 03/15/2023   HCT 39.9 03/15/2023   MCV 90.9 03/15/2023   PLT 192 03/15/2023   Lab Results  Component Value Date   FERRITIN 46 08/31/2022   IRON 86 08/31/2022   TIBC 395 08/31/2022   UIBC 309 08/31/2022   IRONPCTSAT 22 08/31/2022   Lab Results  Component Value Date   RETICCTPCT 2.3 08/31/2022   RBC 4.39 03/15/2023   Lab Results  Component Value Date   KPAFRELGTCHN 276.7 (H) 12/28/2022   LAMBDASER 9.5 12/28/2022   KAPLAMBRATIO 29.13 (H) 12/28/2022   Lab Results  Component Value Date   IGGSERUM 4,222 (H) 12/28/2022   IGGSERUM 4,652 (H) 12/28/2022   IGA 25 (L) 12/28/2022   IGA 25 (L) 12/28/2022    IGMSERUM 21 12/28/2022   IGMSERUM 19 12/28/2022   Lab Results  Component Value Date   TOTALPROTELP 8.5 12/28/2022   ALBUMINELP 3.7 12/28/2022   A1GS 0.2 12/28/2022   A2GS 0.7 12/28/2022   BETS 0.9 12/28/2022  GAMS 2.9 (H) 12/28/2022   MSPIKE 2.5 (H) 12/28/2022   SPEI Comment 02/06/2021     Chemistry      Component Value Date/Time   NA 135 03/15/2023 0948   NA 139 06/20/2020 0923   K 4.1 03/15/2023 0948   CL 99 03/15/2023 0948   CO2 29 03/15/2023 0948   BUN 21 03/15/2023 0948   BUN 17 06/20/2020 0923   CREATININE 1.16 03/15/2023 0948   CREATININE 1.12 09/03/2015 1201      Component Value Date/Time   CALCIUM 9.7 03/15/2023 0948   ALKPHOS 39 03/15/2023 0948   AST 25 03/15/2023 0948   ALT 19 03/15/2023 0948   BILITOT 0.4 03/15/2023 0948       Impression and Plan: Mr. Tantillo is a very pleasant 77 yo caucasian gentleman with smoldering IgG kappa multiple myeloma.  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  Now, he has myeloma.  He has 30% plasma cells in the bone marrow.  I really need to get the [facility] panel.  I do not think this was sent off.  I probably do need to get a PET scan on him.  Also probably need to get a 24-hour urine on him.  I talked him about the protocol.  Again I believe that the daratumumab/Velcade/Revlimid protocol would certainly be worthwhile trying.  I think he would have a high success of working.  Again our goal is to try to get his myeloma levels down so he does not have problems.  I told him that this is some that we are not going to treat.  He is not a candidate for Gonzalez cell transplant.  Again, if we can get his myeloma levels down, then we can adjust his protocol so that he will not have to be treated as often.  Again, we do have some flexibility with treatment.  We do not have to going for at least 2 or 3 weeks.  I will plan to see him back when he starts his treatment.  It will be interesting to see what his monoclonal studies  look like.  We are getting close to the point where we have to treat him.  It would be nice if his blood sugars were under better control.  Hopefully, having him on Jardiance will help.  I think we can still get him through the Summer.  I will plan to see him back in September.  If we find that his monoclonal studies are higher, they are going to have to consider him for therapy.     Josph Macho, MD 9/16/202411:15 AM

## 2023-03-15 NOTE — Progress Notes (Signed)
Patient in Dr Myna Hidalgo exam room.  Dr Myna Hidalgo wants patient to receive Revlimid, Faspro, and Velcade.  [employer] information printed out for patient and Revlimid checklist reviewed with patient.  Patient agrees to all the above checklist listed in Revlimid website.  Reviewed other signs and symptoms of Velcade and Faspro which include but are not limited to myelosuppression, decreased appetite, fatigue, fever, allergic or infusional reaction, altered taste, nausea and vomiting, diarrhea, constipation, elevated LFTs myalgia and arthralgias, hair loss or thinning, rash, skin dryness, peripheral neuropathy, increased risk of infections, weight loss.  Reviewed infusion room and office policy and procedure and phone numbers 24 hours x 7 days a week.  Reviewed when to call the office with any concerns or problems.  Transport planner given.  Antiemetic protocol and chemotherapy schedule reviewed. Patient verbalized understanding of chemotherapy indications and possible side effects.  Teachback done

## 2023-03-16 ENCOUNTER — Other Ambulatory Visit: Payer: Self-pay

## 2023-03-16 ENCOUNTER — Encounter (HOSPITAL_COMMUNITY): Payer: Self-pay | Admitting: Hematology & Oncology

## 2023-03-16 LAB — KAPPA/LAMBDA LIGHT CHAINS
Kappa free light chain: 269.1 mg/L — ABNORMAL HIGH (ref 3.3–19.4)
Kappa, lambda light chain ratio: 32.82 — ABNORMAL HIGH (ref 0.26–1.65)
Lambda free light chains: 8.2 mg/L (ref 5.7–26.3)

## 2023-03-17 NOTE — Addendum Note (Signed)
Addended by: Arlan Organ R on: 03/17/2023 06:06 AM   Modules accepted: Orders

## 2023-03-18 LAB — IGG, IGA, IGM
IgA: 22 mg/dL — ABNORMAL LOW (ref 61–437)
IgG (Immunoglobin G), Serum: 4455 mg/dL — ABNORMAL HIGH (ref 603–1613)
IgM (Immunoglobulin M), Srm: 16 mg/dL (ref 15–143)

## 2023-03-23 LAB — PROTEIN ELECTROPHORESIS, SERUM, WITH REFLEX
A/G Ratio: 0.7 (ref 0.7–1.7)
Albumin ELP: 3.6 g/dL (ref 2.9–4.4)
Alpha-1-Globulin: 0.3 g/dL (ref 0.0–0.4)
Alpha-2-Globulin: 0.9 g/dL (ref 0.4–1.0)
Beta Globulin: 1 g/dL (ref 0.7–1.3)
Gamma Globulin: 3 g/dL — ABNORMAL HIGH (ref 0.4–1.8)
Globulin, Total: 5.1 g/dL — ABNORMAL HIGH (ref 2.2–3.9)
M-Spike, %: 2.8 g/dL — ABNORMAL HIGH
SPEP Interpretation: 0
Total Protein ELP: 8.7 g/dL — ABNORMAL HIGH (ref 6.0–8.5)

## 2023-03-23 LAB — IMMUNOFIXATION REFLEX, SERUM
IgA: 23 mg/dL — ABNORMAL LOW (ref 61–437)
IgG (Immunoglobin G), Serum: 4114 mg/dL — ABNORMAL HIGH (ref 603–1613)
IgM (Immunoglobulin M), Srm: 17 mg/dL (ref 15–143)

## 2023-03-24 ENCOUNTER — Other Ambulatory Visit: Payer: Self-pay | Admitting: *Deleted

## 2023-03-24 DIAGNOSIS — C9000 Multiple myeloma not having achieved remission: Secondary | ICD-10-CM

## 2023-03-24 NOTE — Progress Notes (Signed)
Pharmacist Chemotherapy Monitoring - Initial Assessment    Anticipated start date: 03/31/23   The following has been reviewed per standard work regarding the patient's treatment regimen: The patient's diagnosis, treatment plan and drug doses, and organ/hematologic function Lab orders and baseline tests specific to treatment regimen  The treatment plan start date, drug sequencing, and pre-medications Prior authorization status  Patient's documented medication list, including drug-drug interaction screen and prescriptions for anti-emetics and supportive care specific to the treatment regimen The drug concentrations, fluid compatibility, administration routes, and timing of the medications to be used The patient's access for treatment and lifetime cumulative dose history, if applicable  The patient's medication allergies and previous infusion related reactions, if applicable   Changes made to treatment plan:  treatment plan date  Follow up needed:  N/A   [name], [name], [name], 03/24/2023  8:03 AM

## 2023-03-25 ENCOUNTER — Other Ambulatory Visit: Payer: Self-pay

## 2023-03-26 ENCOUNTER — Encounter (HOSPITAL_COMMUNITY): Payer: Self-pay

## 2023-03-30 ENCOUNTER — Other Ambulatory Visit: Payer: Self-pay | Admitting: Family Medicine

## 2023-03-30 DIAGNOSIS — I712 Thoracic aortic aneurysm, without rupture, unspecified: Secondary | ICD-10-CM

## 2023-03-31 ENCOUNTER — Encounter: Payer: Self-pay | Admitting: Hematology & Oncology

## 2023-03-31 ENCOUNTER — Inpatient Hospital Stay: Payer: Medicare Other | Attending: Hematology & Oncology

## 2023-03-31 ENCOUNTER — Telehealth: Payer: Self-pay

## 2023-03-31 ENCOUNTER — Other Ambulatory Visit: Payer: Self-pay

## 2023-03-31 ENCOUNTER — Other Ambulatory Visit (HOSPITAL_COMMUNITY): Payer: Self-pay

## 2023-03-31 ENCOUNTER — Inpatient Hospital Stay (HOSPITAL_BASED_OUTPATIENT_CLINIC_OR_DEPARTMENT_OTHER): Payer: Medicare Other | Admitting: Hematology & Oncology

## 2023-03-31 ENCOUNTER — Inpatient Hospital Stay: Payer: Medicare Other

## 2023-03-31 ENCOUNTER — Telehealth: Payer: Self-pay | Admitting: Pharmacist

## 2023-03-31 ENCOUNTER — Encounter: Payer: Self-pay | Admitting: Family

## 2023-03-31 VITALS — BP 146/76 | HR 66

## 2023-03-31 VITALS — BP 156/74 | HR 68 | Temp 98.1°F | Resp 20 | Ht 68.0 in | Wt 185.0 lb

## 2023-03-31 DIAGNOSIS — Z7961 Long term (current) use of immunomodulator: Secondary | ICD-10-CM | POA: Diagnosis not present

## 2023-03-31 DIAGNOSIS — C9000 Multiple myeloma not having achieved remission: Secondary | ICD-10-CM | POA: Insufficient documentation

## 2023-03-31 DIAGNOSIS — Z7969 Long term (current) use of other immunomodulators and immunosuppressants: Secondary | ICD-10-CM | POA: Insufficient documentation

## 2023-03-31 DIAGNOSIS — Z5112 Encounter for antineoplastic immunotherapy: Secondary | ICD-10-CM | POA: Diagnosis present

## 2023-03-31 DIAGNOSIS — Z7982 Long term (current) use of aspirin: Secondary | ICD-10-CM | POA: Diagnosis not present

## 2023-03-31 DIAGNOSIS — Z7985 Long-term (current) use of injectable non-insulin antidiabetic drugs: Secondary | ICD-10-CM | POA: Diagnosis not present

## 2023-03-31 DIAGNOSIS — Q999 Chromosomal abnormality, unspecified: Secondary | ICD-10-CM | POA: Diagnosis not present

## 2023-03-31 DIAGNOSIS — E119 Type 2 diabetes mellitus without complications: Secondary | ICD-10-CM | POA: Insufficient documentation

## 2023-03-31 DIAGNOSIS — D509 Iron deficiency anemia, unspecified: Secondary | ICD-10-CM | POA: Diagnosis not present

## 2023-03-31 DIAGNOSIS — Z7984 Long term (current) use of oral hypoglycemic drugs: Secondary | ICD-10-CM | POA: Diagnosis not present

## 2023-03-31 LAB — CBC WITH DIFFERENTIAL (CANCER CENTER ONLY)
Abs Immature Granulocytes: 0.01 10*3/uL (ref 0.00–0.07)
Basophils Absolute: 0 10*3/uL (ref 0.0–0.1)
Basophils Relative: 0 %
Eosinophils Absolute: 0.2 10*3/uL (ref 0.0–0.5)
Eosinophils Relative: 4 %
HCT: 36.9 % — ABNORMAL LOW (ref 39.0–52.0)
Hemoglobin: 12.5 g/dL — ABNORMAL LOW (ref 13.0–17.0)
Immature Granulocytes: 0 %
Lymphocytes Relative: 32 %
Lymphs Abs: 1.5 10*3/uL (ref 0.7–4.0)
MCH: 29.7 pg (ref 26.0–34.0)
MCHC: 33.9 g/dL (ref 30.0–36.0)
MCV: 87.6 fL (ref 80.0–100.0)
Monocytes Absolute: 0.3 10*3/uL (ref 0.1–1.0)
Monocytes Relative: 7 %
Neutro Abs: 2.6 10*3/uL (ref 1.7–7.7)
Neutrophils Relative %: 57 %
Platelet Count: 175 10*3/uL (ref 150–400)
RBC: 4.21 MIL/uL — ABNORMAL LOW (ref 4.22–5.81)
RDW: 13 % (ref 11.5–15.5)
WBC Count: 4.6 10*3/uL (ref 4.0–10.5)
nRBC: 0 % (ref 0.0–0.2)

## 2023-03-31 LAB — TYPE AND SCREEN
ABO/RH(D): A POS
Antibody Screen: NEGATIVE

## 2023-03-31 LAB — CMP (CANCER CENTER ONLY)
ALT: 12 U/L (ref 0–44)
AST: 14 U/L — ABNORMAL LOW (ref 15–41)
Albumin: 3.8 g/dL (ref 3.5–5.0)
Alkaline Phosphatase: 44 U/L (ref 38–126)
Anion gap: 8 (ref 5–15)
BUN: 17 mg/dL (ref 8–23)
CO2: 28 mmol/L (ref 22–32)
Calcium: 9.4 mg/dL (ref 8.9–10.3)
Chloride: 96 mmol/L — ABNORMAL LOW (ref 98–111)
Creatinine: 1.05 mg/dL (ref 0.61–1.24)
GFR, Estimated: >60 mL/min (ref >60)
Glucose, Bld: 247 mg/dL — ABNORMAL HIGH (ref 70–99)
Potassium: 3.6 mmol/L (ref 3.5–5.1)
Sodium: 132 mmol/L — ABNORMAL LOW (ref 135–145)
Total Bilirubin: 0.4 mg/dL (ref 0.3–1.2)
Total Protein: 9.1 g/dL — ABNORMAL HIGH (ref 6.5–8.1)

## 2023-03-31 LAB — LACTATE DEHYDROGENASE: LDH: 109 U/L (ref 98–192)

## 2023-03-31 MED ORDER — BORTEZOMIB CHEMO SQ INJECTION 3.5 MG (2.5MG/ML)
1.3000 mg/m2 | Freq: Once | INTRAMUSCULAR | Status: AC
  Administered 2023-03-31: 2.5 mg via SUBCUTANEOUS
  Filled 2023-03-31: qty 1

## 2023-03-31 MED ORDER — LENALIDOMIDE 15 MG PO CAPS
15.0000 mg | ORAL_CAPSULE | Freq: Every day | ORAL | 0 refills | Status: DC
Start: 2023-03-31 — End: 2023-03-31
  Filled 2023-03-31: qty 21, 21d supply, fill #0

## 2023-03-31 MED ORDER — FAMCICLOVIR 250 MG PO TABS: 250.0000 mg | ORAL_TABLET | Freq: Every day | ORAL | 6 refills | Status: DC

## 2023-03-31 MED ORDER — MONTELUKAST SODIUM 10 MG PO TABS
10.0000 mg | ORAL_TABLET | Freq: Once | ORAL | Status: AC
  Administered 2023-03-31: 10 mg via ORAL
  Filled 2023-03-31: qty 1

## 2023-03-31 MED ORDER — LENALIDOMIDE 15 MG PO CAPS
15.0000 mg | ORAL_CAPSULE | Freq: Every day | ORAL | 0 refills | Status: DC
Start: 2023-03-31 — End: 2023-04-26

## 2023-03-31 MED ORDER — DARATUMUMAB-HYALURONIDASE-FIHJ 1800-30000 MG-UT/15ML ~~LOC~~ SOLN
1800.0000 mg | Freq: Once | SUBCUTANEOUS | Status: AC
  Administered 2023-03-31: 1800 mg via SUBCUTANEOUS
  Filled 2023-03-31: qty 15

## 2023-03-31 MED ORDER — ONDANSETRON HCL 8 MG PO TABS: 8.0000 mg | ORAL_TABLET | Freq: Three times a day (TID) | ORAL | 1 refills | Status: AC | PRN

## 2023-03-31 MED ORDER — ACETAMINOPHEN 325 MG PO TABS
650.0000 mg | ORAL_TABLET | Freq: Once | ORAL | Status: AC
  Administered 2023-03-31: 650 mg via ORAL
  Filled 2023-03-31: qty 2

## 2023-03-31 MED ORDER — LIDOCAINE-PRILOCAINE 2.5-2.5 % EX CREA: TOPICAL_CREAM | CUTANEOUS | 3 refills | Status: DC

## 2023-03-31 MED ORDER — DIPHENHYDRAMINE HCL 25 MG PO CAPS
50.0000 mg | ORAL_CAPSULE | Freq: Once | ORAL | Status: AC
  Administered 2023-03-31: 50 mg via ORAL
  Filled 2023-03-31: qty 2

## 2023-03-31 MED ORDER — PROCHLORPERAZINE MALEATE 10 MG PO TABS: 10.0000 mg | ORAL_TABLET | Freq: Four times a day (QID) | ORAL | 1 refills | Status: DC | PRN

## 2023-03-31 MED ORDER — DEXAMETHASONE 4 MG PO TABS
20.0000 mg | ORAL_TABLET | Freq: Once | ORAL | Status: AC
  Administered 2023-03-31: 20 mg via ORAL
  Filled 2023-03-31: qty 5

## 2023-03-31 NOTE — Telephone Encounter (Addendum)
Oral Oncology Pharmacist Encounter  Received new prescription for Revlimid (lenalidomide) for the treatment of multiple myeloma in conjunction with dartumumab and bortezomib - patient not doing dexamethasone per MD due to diabetes planned duration until disease progression or unacceptable drug toxicity.  CBC w/ Diff and CMP from 03/31/23 assessed, noted patient with blood glucose of 247 mg/dL - per MD note, patient not starting on dexamethasone due to PMH of diabetes. Patient Scr of 1.05 mg/dL (CrCl ~08.6 mL/min). Dosing per MD. Prescription dose and frequency assessed for appropriateness.  Current medication list in Epic reviewed, no relevant/significant DDIs with Revlimid identified.  Evaluated chart and no patient barriers to medication adherence noted.   Patient agreement for treatment documented in treatment plan on 03/31/23.  Patient required to fill through [facility] due to medication being limited distribution - will redirect for dispensing.   Oral Oncology Clinic will continue to follow for insurance authorization, copayment issues, initial counseling and start date.  Lenord Carbo, PharmD, BCPS, BCOP Hematology/Oncology Clinical Pharmacist [facility] Oral Chemotherapy Navigation Clinics [phone] 03/31/2023 10:18 AM

## 2023-03-31 NOTE — Telephone Encounter (Signed)
Oral Oncology Patient Advocate Encounter  New authorization   Received notification that prior authorization for Lenalidomide is required.   PA submitted on 03/31/23  Key [id number]  Status is pending     Ardeen Fillers, CPhT Oncology Pharmacy Patient Advocate  [facility]  [phone] (phone) [phone] (fax) 03/31/2023 10:34 AM

## 2023-03-31 NOTE — Telephone Encounter (Signed)
Oral Oncology Patient Advocate Encounter  Was successful in securing patient a $12,000.00 grant from [facility] to provide copayment coverage for Lenalidomide.  This will keep the out of pocket expense at $0.     [employer] ID: [id number]   The billing information is as follows and has been shared with [facility].    RxBin: [id number] PCN: [id number] Member ID: [id number] Group ID: [id number] Dates of Eligibility: 03/01/23 through 02/28/24  Fund:  Multiple Myeloma - Medicare Access   Ardeen Fillers, CPhT Oncology Pharmacy Patient Advocate  [facility]  [phone] (phone) [phone] (fax) 03/31/2023 11:14 AM

## 2023-03-31 NOTE — Progress Notes (Signed)
Hematology and Oncology Follow Up Visit  [name] [id number] 12-Mar-1946 77 y.o. 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  Unfortunately, I think he does qualify for myeloma now.  He did have a bone marrow biopsy done on 03/08/2023.  The pathology report ([id number]) showed a hypercellular marrow.  There is about for 30% involvement by myeloma cells.  Unfortunately, I think this is just too high of involvement for [place] not to treat.  We saw him back in July, his myeloma studies showed M spike 2.8 g/dL.  His IgG level was about 4400 mg/dL.  The Kappa light chain was 27.0 mg/dL.    I  would like to get a 24-hour urine on him.  He feels well.  He goes to the beach all the time.  He wants his quality life to be a priority.  I told him that I thought we could treat him easily enough.  I think that the treatment would be easier to tolerate and has about 90% of working.  I would probably use daratumumab/Velcade/Revlimid.  I think this would be a good protocol for him.  He has diabetes so I would hold off on Decadron.  He does not complain of any bony pain.  His urine is doing okay.  He has had no problems with urinary frequency.  Has been no problems with his bowels.  He has had no cough or shortness of breath.  He has had no rashes.  Is been no bleeding.  He has had no leg swelling.  Overall, I would say that his performance status is probably ECOG 1.   Medications:  Allergies as of 03/31/2023       Reactions   Clindamycin Hcl Itching   Metoprolol Succinate Other (See Comments)   Pt does not recall.        Medication List        Accurate as of March 31, 2023  9:33 AM. If  you have any questions, ask your nurse or doctor.          STOP taking these medications    glipiZIDE 2.5 MG 24 hr tablet Commonly known as: GLUCOTROL XL Stopped by: Josph Macho   Ozempic (0.25 or 0.5 MG/DOSE) 2 MG/3ML Sopn Generic drug: Semaglutide(0.25 or 0.5MG /DOS) Stopped by: Josph Macho       TAKE these medications    amLODipine 10 MG tablet Commonly known as: NORVASC Take 10 mg by mouth every morning.   aspirin EC 81 MG tablet Take 1 tablet (81 mg total) by mouth daily. Swallow whole.   ezetimibe 10 MG tablet Commonly known as: ZETIA Take 10 mg by mouth daily.   furosemide 20 MG tablet Commonly known as: LASIX Take 20 mg by mouth daily.   metFORMIN 500 MG 24 hr tablet Commonly known as: GLUCOPHAGE-XR Take 4 tablets by mouth daily.   Mounjaro 2.5 MG/0.5ML Pen Generic drug: tirzepatide Inject 2.5 mg into the skin once a week.   omeprazole 20 MG capsule Commonly known as: PRILOSEC Take 20 mg by mouth 2 (two) times daily before a meal.   rosuvastatin 40 MG tablet Commonly known as: CRESTOR Take 40 mg  by mouth daily.   Testosterone 25 MG/2.5GM (1%) Gel Place 1 mg onto the skin every other day.   triamterene-hydrochlorothiazide 37.5-25 MG tablet Commonly known as: MAXZIDE-25 Take 1 tablet by mouth every morning.   valsartan 320 MG tablet Commonly known as: DIOVAN Take 320 mg by mouth daily.        Allergies:  Allergies  Allergen Reactions   Clindamycin Hcl Itching   Metoprolol Succinate Other (See Comments)    Pt does not recall.    Past Medical History, Surgical history, Social history, and Family History were reviewed and updated.  Review of Systems: Review of Systems  Constitutional: Negative.   HENT: Negative.    Eyes: Negative.   Respiratory: Negative.    Cardiovascular: Negative.   Gastrointestinal: Negative.   Genitourinary: Negative.   Musculoskeletal: Negative.   Skin: Negative.   Neurological: Negative.    Endo/Heme/Allergies: Negative.   Psychiatric/Behavioral: Negative.        Physical Exam:  height is 5\' 8"  (1.727 m) and weight is 185 lb (83.9 kg). His oral temperature is 98.1 F (36.7 C). His blood pressure is 156/74 (abnormal) and his pulse is 68. His respiration is 20 and oxygen saturation is 98%.    Wt Readings from Last 3 Encounters:  03/31/23 185 lb (83.9 kg)  03/15/23 183 lb (83 kg)  03/08/23 180 lb (81.6 kg)    Physical Exam Vitals reviewed.  HENT:     Head: Normocephalic and atraumatic.  Eyes:     Pupils: Pupils are equal, round, and reactive to light.  Cardiovascular:     Rate and Rhythm: Normal rate and regular rhythm.     Heart sounds: Normal heart sounds.  Pulmonary:     Effort: Pulmonary effort is normal.     Breath sounds: Normal breath sounds.  Abdominal:     General: Bowel sounds are normal.     Palpations: Abdomen is soft.  Musculoskeletal:        General: No tenderness or deformity. Normal range of motion.     Cervical back: Normal range of motion.  Lymphadenopathy:     Cervical: No cervical adenopathy.  Skin:    General: Skin is warm and dry.     Findings: No erythema or rash.  Neurological:     Mental Status: He is alert and oriented to person, place, and time.  Psychiatric:        Behavior: Behavior normal.        Thought Content: Thought content normal.        Judgment: Judgment normal.      Lab Results  Component Value Date   WBC 4.6 03/31/2023   HGB 12.5 (L) 03/31/2023   HCT 36.9 (L) 03/31/2023   MCV 87.6 03/31/2023   PLT 175 03/31/2023   Lab Results  Component Value Date   FERRITIN 46 08/31/2022   IRON 86 08/31/2022   TIBC 395 08/31/2022   UIBC 309 08/31/2022   IRONPCTSAT 22 08/31/2022   Lab Results  Component Value Date   RETICCTPCT 2.3 08/31/2022   RBC 4.21 (L) 03/31/2023   Lab Results  Component Value Date   KPAFRELGTCHN 269.1 (H) 03/15/2023   LAMBDASER 8.2 03/15/2023   KAPLAMBRATIO 32.82 (H) 03/15/2023   Lab  Results  Component Value Date   IGGSERUM 4,455 (H) 03/15/2023   IGGSERUM 4,114 (H) 03/15/2023   IGA 22 (L) 03/15/2023   IGA 23 (L) 03/15/2023   IGMSERUM 16 03/15/2023   IGMSERUM 17 03/15/2023  Lab Results  Component Value Date   TOTALPROTELP 8.7 (H) 03/15/2023   ALBUMINELP 3.6 03/15/2023   A1GS 0.3 03/15/2023   A2GS 0.9 03/15/2023   BETS 1.0 03/15/2023   GAMS 3.0 (H) 03/15/2023   MSPIKE 2.8 (H) 03/15/2023   SPEI Comment 02/06/2021     Chemistry      Component Value Date/Time   NA 135 03/15/2023 0948   NA 139 06/20/2020 0923   K 4.1 03/15/2023 0948   CL 99 03/15/2023 0948   CO2 29 03/15/2023 0948   BUN 21 03/15/2023 0948   BUN 17 06/20/2020 0923   CREATININE 1.16 03/15/2023 0948   CREATININE 1.12 09/03/2015 1201      Component Value Date/Time   CALCIUM 9.7 03/15/2023 0948   ALKPHOS 39 03/15/2023 0948   AST 25 03/15/2023 0948   ALT 19 03/15/2023 0948   BILITOT 0.4 03/15/2023 0948       Impression and Plan: Mr. Utrera is a very pleasant 77 yo caucasian gentleman with smoldering IgG kappa multiple myeloma.  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  His FISH panel was negative.  Will get the 24-hour urine on him.  He got the container today.  We do need to get a PET scan on him.  His last PET scan was about 4 or 5 years ago..  Will go ahead and get started today.  We will make sure he gets his Revlimid.  He will start taking Revlimid 15 mg p.o. daily for 21 days on and 7 days off.  I told him to start taking 1 full aspirin daily.  He will take this in the morning with food.  Again, I really think we can get his myeloma not back down.  I told him that as we go along, we will start decreasing the intensity of his treatments.  I would like to see him back when he starts his second cycle.     Josph Macho, MD 10/2/20249:33 AM

## 2023-03-31 NOTE — Telephone Encounter (Addendum)
Oral Oncology Patient Advocate Encounter  Prior Authorization for Lenalidomide has been approved.    PA# [id number]  Effective dates: 03/31/23 through 06/28/98  Patients co-pay is $2,144.35.   [employer] obtained to make co-pay $0.00.  Lenalidomide will be filled through [facility].   Script and all supporting documents sent to [facility] for processing and fulfillment.     Ardeen Fillers, CPhT Oncology Pharmacy Patient Advocate  [facility]  [phone] (phone) [phone] (fax) 03/31/2023 11:07 AM

## 2023-03-31 NOTE — Progress Notes (Signed)
Md Reviewed pt labs, " ok to treat despite counts"

## 2023-03-31 NOTE — Addendum Note (Signed)
Addended by: Arlan Organ R on: 03/31/2023 10:34 AM   Modules accepted: Orders

## 2023-04-01 LAB — PRETREATMENT RBC PHENOTYPE: DAT, IgG: NEGATIVE

## 2023-04-02 LAB — IGG, IGA, IGM
IgA: 23 mg/dL — ABNORMAL LOW (ref 61–437)
IgG (Immunoglobin G), Serum: 4829 mg/dL — ABNORMAL HIGH (ref 603–1613)
IgM (Immunoglobulin M), Srm: 16 mg/dL (ref 15–143)

## 2023-04-02 LAB — KAPPA/LAMBDA LIGHT CHAINS
Kappa free light chain: 195.8 mg/L — ABNORMAL HIGH (ref 3.3–19.4)
Kappa, lambda light chain ratio: 21.05 — ABNORMAL HIGH (ref 0.26–1.65)
Lambda free light chains: 9.3 mg/L (ref 5.7–26.3)

## 2023-04-02 LAB — BETA 2 MICROGLOBULIN, SERUM: Beta-2 Microglobulin: 2.7 mg/L — ABNORMAL HIGH (ref 0.6–2.4)

## 2023-04-02 NOTE — Telephone Encounter (Signed)
Received notification that patient received medication from [facility] today, 04/02/23 and started therapy. Patient has also received drug education from pharmacist.    Ardeen Fillers, CPhT Oncology Pharmacy Patient Advocate  [facility]  [phone] (phone) [phone] (fax) 04/02/2023 2:09 PM

## 2023-04-02 NOTE — Telephone Encounter (Signed)
Oral Chemotherapy Pharmacist Encounter  Patient Education I spoke with patient for overview of new oral chemotherapy medication: Revlimid (lenalidomide) for the treatment of multiple myeloma in conjunction with dartumumab and bortezomib, planned duration until disease progression or unacceptable drug toxicity.   Counseled patient on administration, dosing, side effects, monitoring, drug-food interactions, safe handling, storage, and disposal.  Patient will take 1 capsule (15 mg total) by mouth daily. Take for 21 days, then 7 days off. Repeat every 28 days.  Start date: 04/02/23 (took first dose this morning when he received his shipment)  Side effects include but not limited to: diarrhea or constipation, fatigue, rash, nausea/vomiting, increase risk of blood clots.   Diarrhea: can take loperamide (up to 8 tablets per day). Patient is aware to call the office if he has 4 or more loose stools above his baseline in a day. Constipation: can take colace or senokot. Fatigue: advised to take medicine at night. Rash: Patient is aware to call the office if rash occurs so that he can get a prescription to treat it. Nausea/vomiting: Can use prn ondansetron or prochlorperazine, which were both already sent in by MD. Increased risk of clots: Patient is taking aspirin 325mg  daily as recommended by MD.  Reviewed with patient importance of keeping a medication schedule and plan for any missed doses.  After discussion with patient, no patient barriers to medication adherence identified.   Patient voiced understanding and appreciation. All questions answered. Medication handout and dosing calendar to be provided in the mail.  Provided patient with Oral Chemotherapy Navigation Clinic phone number. Patient knows to call the office with questions or concerns.  Nicole Kindred, PharmD PGY1 Pharmacy Resident 04/02/2023 1:28 PM

## 2023-04-05 ENCOUNTER — Inpatient Hospital Stay: Payer: Medicare Other

## 2023-04-05 ENCOUNTER — Telehealth: Payer: Self-pay

## 2023-04-05 DIAGNOSIS — C9000 Multiple myeloma not having achieved remission: Secondary | ICD-10-CM

## 2023-04-05 DIAGNOSIS — Z5112 Encounter for antineoplastic immunotherapy: Secondary | ICD-10-CM | POA: Diagnosis not present

## 2023-04-05 MED ORDER — ACYCLOVIR 200 MG PO CAPS
200.0000 mg | ORAL_CAPSULE | Freq: Two times a day (BID) | ORAL | 0 refills | Status: DC
Start: 2023-04-05 — End: 2023-08-04

## 2023-04-05 NOTE — Telephone Encounter (Signed)
Pt presented in the waiting room requesting to speak to a nurse. Pt stated that when he takes his famciclovir he starts to itch all over his body worse on his head and behind his ears. Pt states he does not want to take the medication anymore as the itching is unbearable. Pt stated his last dose was 04/04/2023. Dr. Myna Hidalgo aware and stated to d/c famciclovir. New prescription sent to patient preferred pharmacy. Pt aware of new prescription. Pt verbalized understanding and had no further questions.

## 2023-04-06 LAB — UPEP/UIFE/LIGHT CHAINS/TP, 24-HR UR
% BETA, Urine: 70.7 %
ALPHA 1 URINE: 2.8 %
Albumin, U: 14.1 %
Alpha 2, Urine: 6.7 %
Free Kappa Lt Chains,Ur: 833.08 mg/L — ABNORMAL HIGH (ref 1.17–86.46)
Free Kappa/Lambda Ratio: 86.33 — ABNORMAL HIGH (ref 1.83–14.26)
Free Lambda Lt Chains,Ur: 9.65 mg/L (ref 0.27–15.21)
GAMMA GLOBULIN URINE: 5.6 %
M-SPIKE %, Urine: 50.3 % — ABNORMAL HIGH
M-Spike, Mg/24 Hr: 329 mg/(24.h) — ABNORMAL HIGH
Total Protein, Urine-Ur/day: 655 mg/(24.h) — ABNORMAL HIGH (ref 30–150)
Total Protein, Urine: 37.4 mg/dL
Total Volume: 1750

## 2023-04-07 LAB — IMMUNOFIXATION REFLEX, SERUM
IgA: 22 mg/dL — ABNORMAL LOW (ref 61–437)
IgG (Immunoglobin G), Serum: 4225 mg/dL — ABNORMAL HIGH (ref 603–1613)
IgM (Immunoglobulin M), Srm: 15 mg/dL (ref 15–143)

## 2023-04-07 LAB — PROTEIN ELECTROPHORESIS, SERUM, WITH REFLEX
A/G Ratio: 0.8 (ref 0.7–1.7)
Albumin ELP: 3.9 g/dL (ref 2.9–4.4)
Alpha-1-Globulin: 0.3 g/dL (ref 0.0–0.4)
Alpha-2-Globulin: 0.8 g/dL (ref 0.4–1.0)
Beta Globulin: 0.9 g/dL (ref 0.7–1.3)
Gamma Globulin: 3.1 g/dL — ABNORMAL HIGH (ref 0.4–1.8)
Globulin, Total: 5.1 g/dL — ABNORMAL HIGH (ref 2.2–3.9)
M-Spike, %: 3 g/dL — ABNORMAL HIGH
SPEP Interpretation: 0
Total Protein ELP: 9 g/dL — ABNORMAL HIGH (ref 6.0–8.5)

## 2023-04-08 ENCOUNTER — Inpatient Hospital Stay: Payer: Medicare Other

## 2023-04-08 VITALS — BP 131/49 | HR 73 | Temp 97.9°F | Resp 18

## 2023-04-08 DIAGNOSIS — C9000 Multiple myeloma not having achieved remission: Secondary | ICD-10-CM

## 2023-04-08 DIAGNOSIS — Z5112 Encounter for antineoplastic immunotherapy: Secondary | ICD-10-CM | POA: Diagnosis not present

## 2023-04-08 LAB — CBC WITH DIFFERENTIAL (CANCER CENTER ONLY)
Abs Immature Granulocytes: 0.01 10*3/uL (ref 0.00–0.07)
Basophils Absolute: 0 10*3/uL (ref 0.0–0.1)
Basophils Relative: 0 %
Eosinophils Absolute: 0.1 10*3/uL (ref 0.0–0.5)
Eosinophils Relative: 5 %
HCT: 36.8 % — ABNORMAL LOW (ref 39.0–52.0)
Hemoglobin: 12.5 g/dL — ABNORMAL LOW (ref 13.0–17.0)
Immature Granulocytes: 0 %
Lymphocytes Relative: 24 %
Lymphs Abs: 0.7 10*3/uL (ref 0.7–4.0)
MCH: 29.7 pg (ref 26.0–34.0)
MCHC: 34 g/dL (ref 30.0–36.0)
MCV: 87.4 fL (ref 80.0–100.0)
Monocytes Absolute: 0.2 10*3/uL (ref 0.1–1.0)
Monocytes Relative: 6 %
Neutro Abs: 1.8 10*3/uL (ref 1.7–7.7)
Neutrophils Relative %: 65 %
Platelet Count: 138 10*3/uL — ABNORMAL LOW (ref 150–400)
RBC: 4.21 MIL/uL — ABNORMAL LOW (ref 4.22–5.81)
RDW: 12.9 % (ref 11.5–15.5)
WBC Count: 2.8 10*3/uL — ABNORMAL LOW (ref 4.0–10.5)
nRBC: 0 % (ref 0.0–0.2)

## 2023-04-08 LAB — CMP (CANCER CENTER ONLY)
ALT: 21 U/L (ref 0–44)
AST: 15 U/L (ref 15–41)
Albumin: 3.5 g/dL (ref 3.5–5.0)
Alkaline Phosphatase: 44 U/L (ref 38–126)
Anion gap: 8 (ref 5–15)
BUN: 22 mg/dL (ref 8–23)
CO2: 27 mmol/L (ref 22–32)
Calcium: 9.1 mg/dL (ref 8.9–10.3)
Chloride: 96 mmol/L — ABNORMAL LOW (ref 98–111)
Creatinine: 1.25 mg/dL — ABNORMAL HIGH (ref 0.61–1.24)
GFR, Estimated: 59 mL/min — ABNORMAL LOW (ref >60)
Glucose, Bld: 348 mg/dL — ABNORMAL HIGH (ref 70–99)
Potassium: 5.1 mmol/L (ref 3.5–5.1)
Sodium: 131 mmol/L — ABNORMAL LOW (ref 135–145)
Total Bilirubin: 0.4 mg/dL (ref 0.3–1.2)
Total Protein: 7.8 g/dL (ref 6.5–8.1)

## 2023-04-08 MED ORDER — DEXAMETHASONE 4 MG PO TABS
20.0000 mg | ORAL_TABLET | Freq: Once | ORAL | Status: AC
  Administered 2023-04-08: 20 mg via ORAL
  Filled 2023-04-08: qty 5

## 2023-04-08 MED ORDER — DIPHENHYDRAMINE HCL 25 MG PO CAPS
50.0000 mg | ORAL_CAPSULE | Freq: Once | ORAL | Status: AC
  Administered 2023-04-08: 50 mg via ORAL
  Filled 2023-04-08: qty 2

## 2023-04-08 MED ORDER — ACETAMINOPHEN 325 MG PO TABS
650.0000 mg | ORAL_TABLET | Freq: Once | ORAL | Status: AC
  Administered 2023-04-08: 650 mg via ORAL
  Filled 2023-04-08: qty 2

## 2023-04-08 MED ORDER — DARATUMUMAB-HYALURONIDASE-FIHJ 1800-30000 MG-UT/15ML ~~LOC~~ SOLN
1800.0000 mg | Freq: Once | SUBCUTANEOUS | Status: AC
  Administered 2023-04-08: 1800 mg via SUBCUTANEOUS
  Filled 2023-04-08: qty 15

## 2023-04-08 MED ORDER — MONTELUKAST SODIUM 10 MG PO TABS
10.0000 mg | ORAL_TABLET | Freq: Once | ORAL | Status: AC
  Administered 2023-04-08: 10 mg via ORAL
  Filled 2023-04-08: qty 1

## 2023-04-08 MED ORDER — BORTEZOMIB CHEMO SQ INJECTION 3.5 MG (2.5MG/ML)
1.3000 mg/m2 | Freq: Once | INTRAMUSCULAR | Status: AC
  Administered 2023-04-08: 2.5 mg via SUBCUTANEOUS
  Filled 2023-04-08: qty 1

## 2023-04-08 NOTE — Patient Instructions (Signed)
[facility]  Discharge Instructions: Thank you for choosing [facility] to provide your oncology and hematology care.   If you have a lab appointment with the Cancer Center, please go directly to the Cancer Center and check in at the registration area.  Wear comfortable clothing and clothing appropriate for easy access to any Portacath or PICC line.   We strive to give you quality time with your provider. You may need to reschedule your appointment if you arrive late (15 or more minutes).  Arriving late affects you and other patients whose appointments are after yours.  Also, if you miss three or more appointments without notifying the office, you may be dismissed from the clinic at the provider's discretion.      For prescription refill requests, have your pharmacy contact our office and allow 72 hours for refills to be completed.    Today you received the following chemotherapy and/or immunotherapy agents Velcade/Darzalex      To help prevent nausea and vomiting after your treatment, we encourage you to take your nausea medication as directed.  BELOW ARE SYMPTOMS THAT SHOULD BE REPORTED IMMEDIATELY: *FEVER GREATER THAN 100.4 F (38 C) OR HIGHER *CHILLS OR SWEATING *NAUSEA AND VOMITING THAT IS NOT CONTROLLED WITH YOUR NAUSEA MEDICATION *UNUSUAL SHORTNESS OF BREATH *UNUSUAL BRUISING OR BLEEDING *URINARY PROBLEMS (pain or burning when urinating, or frequent urination) *BOWEL PROBLEMS (unusual diarrhea, constipation, pain near the anus) TENDERNESS IN MOUTH AND THROAT WITH OR WITHOUT PRESENCE OF ULCERS (sore throat, sores in mouth, or a toothache) UNUSUAL RASH, SWELLING OR PAIN  UNUSUAL VAGINAL DISCHARGE OR ITCHING   Items with * indicate a potential emergency and should be followed up as soon as possible or go to the Emergency Department if any problems should occur.  Please show the CHEMOTHERAPY ALERT CARD or IMMUNOTHERAPY ALERT CARD at  check-in to the Emergency Department and triage nurse. Should you have questions after your visit or need to cancel or reschedule your appointment, please contact [place] [facility] [place]  [phone] and follow the prompts.  Office hours are 8:00 a.m. to 4:30 p.m. Monday - Friday. Please note that voicemails left after 4:00 p.m. may not be returned until the following business day.  We are closed weekends and major holidays. You have access to a nurse at all times for urgent questions. Please call the main number to the clinic [phone] and follow the prompts.  For any non-urgent questions, you may also contact your provider using MyChart. We now offer e-Visits for anyone 2 and older to request care online for non-urgent symptoms. For details visit [email or web address].   Also download the MyChart app! Go to the app store, search "MyChart", open the app, select [place], and log in with your MyChart username and password.

## 2023-04-08 NOTE — Progress Notes (Signed)
Dexamethasone removed from careplan for all future dates per Dr. Gustavo Lah instructions.

## 2023-04-12 ENCOUNTER — Encounter (HOSPITAL_COMMUNITY)
Admission: RE | Admit: 2023-04-12 | Discharge: 2023-04-12 | Disposition: A | Discharge status: Home / Self Care | Payer: Medicare Other | Source: Ambulatory Visit | Attending: Hematology & Oncology | Admitting: Hematology & Oncology

## 2023-04-12 DIAGNOSIS — C9000 Multiple myeloma not having achieved remission: Secondary | ICD-10-CM | POA: Insufficient documentation

## 2023-04-12 LAB — GLUCOSE, CAPILLARY
Glucose-Capillary: 241 mg/dL — ABNORMAL HIGH (ref 70–99)
Glucose-Capillary: 260 mg/dL — ABNORMAL HIGH (ref 70–99)
Glucose-Capillary: 261 mg/dL — ABNORMAL HIGH (ref 70–99)
Glucose-Capillary: 262 mg/dL — ABNORMAL HIGH (ref 70–99)
Glucose-Capillary: 290 mg/dL — ABNORMAL HIGH (ref 70–99)

## 2023-04-12 MED ORDER — FLUDEOXYGLUCOSE F - 18 (FDG) INJECTION
9.2000 | Freq: Once | INTRAVENOUS | Status: AC
  Administered 2023-04-12: 9.15 via INTRAVENOUS

## 2023-04-14 ENCOUNTER — Other Ambulatory Visit: Payer: Medicare Other

## 2023-04-15 ENCOUNTER — Inpatient Hospital Stay: Payer: Medicare Other | Admitting: Medical Oncology

## 2023-04-15 ENCOUNTER — Inpatient Hospital Stay: Payer: Medicare Other

## 2023-04-15 ENCOUNTER — Encounter: Payer: Self-pay | Admitting: Medical Oncology

## 2023-04-15 ENCOUNTER — Encounter: Payer: Self-pay | Admitting: Hematology & Oncology

## 2023-04-15 VITALS — BP 140/63 | HR 63 | Temp 97.8°F | Resp 18

## 2023-04-15 DIAGNOSIS — Z5112 Encounter for antineoplastic immunotherapy: Secondary | ICD-10-CM | POA: Diagnosis not present

## 2023-04-15 DIAGNOSIS — C9000 Multiple myeloma not having achieved remission: Secondary | ICD-10-CM

## 2023-04-15 LAB — CBC WITH DIFFERENTIAL (CANCER CENTER ONLY)
Abs Immature Granulocytes: 0.01 10*3/uL (ref 0.00–0.07)
Basophils Absolute: 0 10*3/uL (ref 0.0–0.1)
Basophils Relative: 0 %
Eosinophils Absolute: 0.3 10*3/uL (ref 0.0–0.5)
Eosinophils Relative: 13 %
HCT: 37.7 % — ABNORMAL LOW (ref 39.0–52.0)
Hemoglobin: 12.7 g/dL — ABNORMAL LOW (ref 13.0–17.0)
Immature Granulocytes: 0 %
Lymphocytes Relative: 21 %
Lymphs Abs: 0.5 10*3/uL — ABNORMAL LOW (ref 0.7–4.0)
MCH: 29.4 pg (ref 26.0–34.0)
MCHC: 33.7 g/dL (ref 30.0–36.0)
MCV: 87.3 fL (ref 80.0–100.0)
Monocytes Absolute: 0.2 10*3/uL (ref 0.1–1.0)
Monocytes Relative: 9 %
Neutro Abs: 1.4 10*3/uL — ABNORMAL LOW (ref 1.7–7.7)
Neutrophils Relative %: 57 %
Platelet Count: 143 10*3/uL — ABNORMAL LOW (ref 150–400)
RBC: 4.32 MIL/uL (ref 4.22–5.81)
RDW: 12.8 % (ref 11.5–15.5)
WBC Count: 2.5 10*3/uL — ABNORMAL LOW (ref 4.0–10.5)
nRBC: 0 % (ref 0.0–0.2)

## 2023-04-15 LAB — CMP (CANCER CENTER ONLY)
ALT: 20 U/L (ref 0–44)
AST: 15 U/L (ref 15–41)
Albumin: 3.8 g/dL (ref 3.5–5.0)
Alkaline Phosphatase: 47 U/L (ref 38–126)
Anion gap: 11 (ref 5–15)
BUN: 18 mg/dL (ref 8–23)
CO2: 27 mmol/L (ref 22–32)
Calcium: 9.3 mg/dL (ref 8.9–10.3)
Chloride: 97 mmol/L — ABNORMAL LOW (ref 98–111)
Creatinine: 1.22 mg/dL (ref 0.61–1.24)
GFR, Estimated: >60 mL/min (ref >60)
Glucose, Bld: 267 mg/dL — ABNORMAL HIGH (ref 70–99)
Potassium: 3.7 mmol/L (ref 3.5–5.1)
Sodium: 135 mmol/L (ref 135–145)
Total Bilirubin: 0.5 mg/dL (ref 0.3–1.2)
Total Protein: 7.9 g/dL (ref 6.5–8.1)

## 2023-04-15 LAB — LACTATE DEHYDROGENASE: LDH: 123 U/L (ref 98–192)

## 2023-04-15 MED ORDER — MONTELUKAST SODIUM 10 MG PO TABS
10.0000 mg | ORAL_TABLET | Freq: Once | ORAL | Status: AC
  Administered 2023-04-15: 10 mg via ORAL
  Filled 2023-04-15: qty 1

## 2023-04-15 MED ORDER — DIPHENHYDRAMINE HCL 25 MG PO CAPS
50.0000 mg | ORAL_CAPSULE | Freq: Once | ORAL | Status: AC
  Administered 2023-04-15: 50 mg via ORAL
  Filled 2023-04-15: qty 2

## 2023-04-15 MED ORDER — ACETAMINOPHEN 325 MG PO TABS
650.0000 mg | ORAL_TABLET | Freq: Once | ORAL | Status: AC
  Administered 2023-04-15: 650 mg via ORAL
  Filled 2023-04-15: qty 2

## 2023-04-15 MED ORDER — DARATUMUMAB-HYALURONIDASE-FIHJ 1800-30000 MG-UT/15ML ~~LOC~~ SOLN
1800.0000 mg | Freq: Once | SUBCUTANEOUS | Status: AC
  Administered 2023-04-15: 1800 mg via SUBCUTANEOUS
  Filled 2023-04-15: qty 15

## 2023-04-15 NOTE — Patient Instructions (Addendum)
[place] [facility]  Discharge Instructions: Thank you for choosing [facility] to provide your oncology and hematology care.   If you have a lab appointment with the Cancer Center, please go directly to the Cancer Center and check in at the registration area.  Wear comfortable clothing and clothing appropriate for easy access to any Portacath or PICC line.   We strive to give you quality time with your provider. You may need to reschedule your appointment if you arrive late (15 or more minutes).  Arriving late affects you and other patients whose appointments are after yours.  Also, if you miss three or more appointments without notifying the office, you may be dismissed from the clinic at the provider's discretion.      For prescription refill requests, have your pharmacy contact our office and allow 72 hours for refills to be completed.    Today you received the following chemotherapy and/or immunotherapy agents Darzalex      To help prevent nausea and vomiting after your treatment, we encourage you to take your nausea medication as directed.  BELOW ARE SYMPTOMS THAT SHOULD BE REPORTED IMMEDIATELY: *FEVER GREATER THAN 100.4 F (38 C) OR HIGHER *CHILLS OR SWEATING *NAUSEA AND VOMITING THAT IS NOT CONTROLLED WITH YOUR NAUSEA MEDICATION *UNUSUAL SHORTNESS OF BREATH *UNUSUAL BRUISING OR BLEEDING *URINARY PROBLEMS (pain or burning when urinating, or frequent urination) *BOWEL PROBLEMS (unusual diarrhea, constipation, pain near the anus) TENDERNESS IN MOUTH AND THROAT WITH OR WITHOUT PRESENCE OF ULCERS (sore throat, sores in mouth, or a toothache) UNUSUAL RASH, SWELLING OR PAIN  UNUSUAL VAGINAL DISCHARGE OR ITCHING   Items with * indicate a potential emergency and should be followed up as soon as possible or go to the Emergency Department if any problems should occur.  Please show the CHEMOTHERAPY ALERT CARD or IMMUNOTHERAPY ALERT CARD at check-in  to the Emergency Department and triage nurse. Should you have questions after your visit or need to cancel or reschedule your appointment, please contact [place] [facility] [place]  [phone] and follow the prompts.  Office hours are 8:00 a.m. to 4:30 p.m. Monday - Friday. Please note that voicemails left after 4:00 p.m. may not be returned until the following business day.  We are closed weekends and major holidays. You have access to a nurse at all times for urgent questions. Please call the main number to the clinic [phone] and follow the prompts.  For any non-urgent questions, you may also contact your provider using MyChart. We now offer e-Visits for anyone 65 and older to request care online for non-urgent symptoms. For details visit [email or web address].   Also download the MyChart app! Go to the app store, search "MyChart", open the app, select [place], and log in with your MyChart username and password.

## 2023-04-15 NOTE — Progress Notes (Signed)
No Velcade today per MD.  Richardean Sale, RPH, BCPS, BCOP 04/15/2023 10:53 AM

## 2023-04-15 NOTE — Progress Notes (Addendum)
Hematology and Oncology Follow Up Visit  [name] [id number] 1945-11-04 77 y.o. 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  He had a bone marrow biopsy done on 03/08/2023.  The pathology report ([id number]) showed a hypercellular marrow.  There is about for 30% involvement by myeloma cells.  We saw him back in July, his myeloma studies showed M spike 3 g/dL.  His IgG level was about 4829 mg/dL.  The Kappa light chain was 195.8 mg/L.  His goal is quality of life   Currently on daratumumab/Velcade/Revlimid without Decadron given his diabetes. He is due for consideration of Day 15 cycle 1 today. He is tolerating well.   He does not complain of any bony pain.  His urine is doing okay.  He has had no problems with urinary frequency.  Has been no problems with his bowels.  He has had no cough or shortness of breath.  He has had no rashes.  Is been no bleeding.  He has had no leg swelling.  Overall, I would say that his performance status is probably ECOG 1. Wt Readings from Last 3 Encounters:  04/15/23 180 lb (81.6 kg)  03/31/23 185 lb (83.9 kg)  03/15/23 183 lb (83 kg)    Medications:  Allergies as of 04/15/2023       Reactions   Clindamycin Hcl Itching   Famvir [famciclovir] Itching   Metoprolol Succinate Other (See Comments)   Pt does not recall.        Medication List        Accurate as of April 15, 2023  9:34 AM. If you have any questions, ask your nurse or doctor.          acyclovir 200 MG capsule Commonly known as: ZOVIRAX Take 1 capsule (200 mg total) by mouth 2 (two) times daily.   amLODipine 10 MG tablet Commonly known as: NORVASC Take 10 mg by mouth every  morning.   aspirin EC 325 MG tablet Take 325 mg by mouth daily.   ezetimibe 10 MG tablet Commonly known as: ZETIA Take 10 mg by mouth daily.   famciclovir 250 MG tablet Commonly known as: FAMVIR Take 1 tablet (250 mg total) by mouth daily.   furosemide 20 MG tablet Commonly known as: LASIX Take 20 mg by mouth daily.   lenalidomide 15 MG capsule Commonly known as: REVLIMID Take 1 capsule (15 mg total) by mouth daily. Take for 21 days, then 7 days off. Repeat every 28 days. [employer] Auth # [id number]; Date Obtained 03/31/2023   lidocaine-prilocaine cream Commonly known as: EMLA Apply to affected area once   metFORMIN 500 MG 24 hr tablet Commonly known as: GLUCOPHAGE-XR Take 4 tablets by mouth daily.   Mounjaro 2.5 MG/0.5ML Pen Generic drug: tirzepatide Inject 5 mg into the skin once a week.   omeprazole 20 MG capsule Commonly known as: PRILOSEC Take 20 mg by mouth 2 (two) times daily before a meal.   ondansetron 8 MG tablet Commonly known as: Zofran Take 1 tablet (8 mg total) by mouth every 8 (eight) hours as needed for nausea or vomiting.   prochlorperazine 10 MG tablet Commonly known as: COMPAZINE Take 1 tablet (  10 mg total) by mouth every 6 (six) hours as needed for nausea or vomiting.   rosuvastatin 40 MG tablet Commonly known as: CRESTOR Take 40 mg by mouth daily.   Testosterone 25 MG/2.5GM (1%) Gel Place 1 mg onto the skin every other day.   triamterene-hydrochlorothiazide 37.5-25 MG tablet Commonly known as: MAXZIDE-25 Take 1 tablet by mouth every morning.   valsartan 320 MG tablet Commonly known as: DIOVAN Take 320 mg by mouth daily.        Allergies:  Allergies  Allergen Reactions   Clindamycin Hcl Itching   Famvir [Famciclovir] Itching   Metoprolol Succinate Other (See Comments)    Pt does not recall.    Past Medical History, Surgical history, Social history, and Family History were reviewed and updated.  Review of Systems: Review of  Systems  Constitutional: Negative.   HENT: Negative.    Eyes: Negative.   Respiratory: Negative.    Cardiovascular: Negative.   Gastrointestinal: Negative.   Genitourinary: Negative.   Musculoskeletal: Negative.   Skin: Negative.   Neurological: Negative.   Endo/Heme/Allergies: Negative.   Psychiatric/Behavioral: Negative.        Physical Exam:  weight is 180 lb (81.6 kg). His oral temperature is 97.7 F (36.5 C). His blood pressure is 136/66 and his pulse is 78. His respiration is 18 and oxygen saturation is 99%.    Wt Readings from Last 3 Encounters:  04/15/23 180 lb (81.6 kg)  03/31/23 185 lb (83.9 kg)  03/15/23 183 lb (83 kg)    Physical Exam Vitals reviewed.  HENT:     Head: Normocephalic and atraumatic.  Eyes:     Pupils: Pupils are equal, round, and reactive to light.  Cardiovascular:     Rate and Rhythm: Normal rate and regular rhythm.     Heart sounds: Normal heart sounds.  Pulmonary:     Effort: Pulmonary effort is normal.     Breath sounds: Normal breath sounds.  Abdominal:     General: Bowel sounds are normal.     Palpations: Abdomen is soft.  Musculoskeletal:        General: No tenderness or deformity. Normal range of motion.     Cervical back: Normal range of motion.  Lymphadenopathy:     Cervical: No cervical adenopathy.  Skin:    General: Skin is warm and dry.     Findings: No erythema or rash.  Neurological:     Mental Status: He is alert and oriented to person, place, and time.  Psychiatric:        Behavior: Behavior normal.        Thought Content: Thought content normal.        Judgment: Judgment normal.      Lab Results  Component Value Date   WBC 2.5 (L) 04/15/2023   HGB 12.7 (L) 04/15/2023   HCT 37.7 (L) 04/15/2023   MCV 87.3 04/15/2023   PLT 143 (L) 04/15/2023   Lab Results  Component Value Date   FERRITIN 46 08/31/2022   IRON 86 08/31/2022   TIBC 395 08/31/2022   UIBC 309 08/31/2022   IRONPCTSAT 22 08/31/2022   Lab  Results  Component Value Date   RETICCTPCT 2.3 08/31/2022   RBC 4.32 04/15/2023   Lab Results  Component Value Date   KPAFRELGTCHN 195.8 (H) 03/31/2023   LAMBDASER 9.3 03/31/2023   KAPLAMBRATIO 86.33 (H) 04/05/2023   Lab Results  Component Value Date   IGGSERUM 4,829 (H) 03/31/2023   IGGSERUM 4,225 (  H) 03/31/2023   IGA 23 (L) 03/31/2023   IGA 22 (L) 03/31/2023   IGMSERUM 16 03/31/2023   IGMSERUM 15 03/31/2023   Lab Results  Component Value Date   TOTALPROTELP 9.0 (H) 03/31/2023   ALBUMINELP 3.9 03/31/2023   A1GS 0.3 03/31/2023   A2GS 0.8 03/31/2023   BETS 0.9 03/31/2023   GAMS 3.1 (H) 03/31/2023   MSPIKE 3.0 (H) 03/31/2023   SPEI Comment 02/06/2021     Chemistry      Component Value Date/Time   NA 131 (L) 04/08/2023 0846   NA 139 06/20/2020 0923   K 5.1 04/08/2023 0846   CL 96 (L) 04/08/2023 0846   CO2 27 04/08/2023 0846   BUN 22 04/08/2023 0846   BUN 17 06/20/2020 0923   CREATININE 1.25 (H) 04/08/2023 0846   CREATININE 1.12 09/03/2015 1201      Component Value Date/Time   CALCIUM 9.1 04/08/2023 0846   ALKPHOS 44 04/08/2023 0846   AST 15 04/08/2023 0846   ALT 21 04/08/2023 0846   BILITOT 0.4 04/08/2023 0846       Impression and Plan: [name] is a very pleasant 77 yo caucasian gentleman with IgG kappa multiple myeloma.  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  His FISH panel was negative. Currently he is on daratumumab/Velcade/Revlimid without Decadron. He is here for consideration of Day 15 cycle 1.   Patient is off schedule with provider visits. I have discussed and reviewed with Dr. Myna Hidalgo.   RTC 1 month MD only, labs(CBC w/, CMP), Cycle 3 Day 1 DVR-Brandon   Rushie Chestnut, PA-C 10/17/20249:34 AM

## 2023-04-15 NOTE — Progress Notes (Signed)
Ok to treat with creatinine 1.4 per Clent Jacks, PA.

## 2023-04-15 NOTE — Addendum Note (Signed)
Addended by: Clent Jacks on: 04/15/2023 11:40 AM   Modules accepted: Orders

## 2023-04-16 LAB — KAPPA/LAMBDA LIGHT CHAINS
Kappa free light chain: 119.7 mg/L — ABNORMAL HIGH (ref 3.3–19.4)
Kappa, lambda light chain ratio: 13.76 — ABNORMAL HIGH (ref 0.26–1.65)
Lambda free light chains: 8.7 mg/L (ref 5.7–26.3)

## 2023-04-17 LAB — IGG, IGA, IGM
IgA: 23 mg/dL — ABNORMAL LOW (ref 61–437)
IgG (Immunoglobin G), Serum: 2574 mg/dL — ABNORMAL HIGH (ref 603–1613)
IgM (Immunoglobulin M), Srm: 16 mg/dL (ref 15–143)

## 2023-04-19 ENCOUNTER — Telehealth: Payer: Self-pay

## 2023-04-19 NOTE — Telephone Encounter (Signed)
Advised via MyChart.

## 2023-04-19 NOTE — Telephone Encounter (Signed)
-----   Message from Josph Macho sent at 04/17/2023 10:51 AM EDT ----- Please call and let him know that his protein levels have come down by almost 50% already.  Great job.  [name]

## 2023-04-22 ENCOUNTER — Inpatient Hospital Stay: Payer: Medicare Other

## 2023-04-22 ENCOUNTER — Other Ambulatory Visit: Payer: Self-pay | Admitting: Medical Oncology

## 2023-04-22 DIAGNOSIS — C9000 Multiple myeloma not having achieved remission: Secondary | ICD-10-CM

## 2023-04-22 DIAGNOSIS — Z5112 Encounter for antineoplastic immunotherapy: Secondary | ICD-10-CM | POA: Diagnosis not present

## 2023-04-22 LAB — CBC WITH DIFFERENTIAL (CANCER CENTER ONLY)
Abs Immature Granulocytes: 0 10*3/uL (ref 0.00–0.07)
Basophils Absolute: 0 10*3/uL (ref 0.0–0.1)
Basophils Relative: 1 %
Eosinophils Absolute: 0.3 10*3/uL (ref 0.0–0.5)
Eosinophils Relative: 22 %
HCT: 31.6 % — ABNORMAL LOW (ref 39.0–52.0)
Hemoglobin: 11.1 g/dL — ABNORMAL LOW (ref 13.0–17.0)
Immature Granulocytes: 0 %
Lymphocytes Relative: 36 %
Lymphs Abs: 0.5 10*3/uL — ABNORMAL LOW (ref 0.7–4.0)
MCH: 29.8 pg (ref 26.0–34.0)
MCHC: 35.1 g/dL (ref 30.0–36.0)
MCV: 84.9 fL (ref 80.0–100.0)
Monocytes Absolute: 0.1 10*3/uL (ref 0.1–1.0)
Monocytes Relative: 9 %
Neutro Abs: 0.5 10*3/uL — ABNORMAL LOW (ref 1.7–7.7)
Neutrophils Relative %: 32 %
Platelet Count: 110 10*3/uL — ABNORMAL LOW (ref 150–400)
RBC: 3.72 MIL/uL — ABNORMAL LOW (ref 4.22–5.81)
RDW: 12.7 % (ref 11.5–15.5)
WBC Count: 1.5 10*3/uL — ABNORMAL LOW (ref 4.0–10.5)
nRBC: 0 % (ref 0.0–0.2)

## 2023-04-22 LAB — CMP (CANCER CENTER ONLY)
ALT: 20 U/L (ref 0–44)
AST: 13 U/L — ABNORMAL LOW (ref 15–41)
Albumin: 3.6 g/dL (ref 3.5–5.0)
Alkaline Phosphatase: 45 U/L (ref 38–126)
Anion gap: 10 (ref 5–15)
BUN: 19 mg/dL (ref 8–23)
CO2: 25 mmol/L (ref 22–32)
Calcium: 8.5 mg/dL — ABNORMAL LOW (ref 8.9–10.3)
Chloride: 99 mmol/L (ref 98–111)
Creatinine: 1.21 mg/dL (ref 0.61–1.24)
GFR, Estimated: >60 mL/min (ref >60)
Glucose, Bld: 250 mg/dL — ABNORMAL HIGH (ref 70–99)
Potassium: 3.8 mmol/L (ref 3.5–5.1)
Sodium: 134 mmol/L — ABNORMAL LOW (ref 135–145)
Total Bilirubin: 0.6 mg/dL (ref 0.3–1.2)
Total Protein: 6.6 g/dL (ref 6.5–8.1)

## 2023-04-22 MED ORDER — LEVOFLOXACIN 500 MG PO TABS: 500.0000 mg | ORAL_TABLET | Freq: Every day | ORAL | 0 refills | Status: DC

## 2023-04-22 NOTE — Progress Notes (Signed)
Pt entered center today for treatment, Abs Neutrofil noted at 0.5. Results reviewed by S. Willow, [place] Treatment held per PA Antibiotics to be  started today. Pt is to turn to center next week for labs and possible treatment.

## 2023-04-22 NOTE — Patient Instructions (Signed)
Neutropenia Neutropenia is a condition that occurs when you have low levels of neutrophils. Neutrophils are a type of white blood cells. They are made in the spongy center of bones (bone marrow). They fight infections. Neutrophils are your body's main defense against infections. The fewer neutrophils you have and the longer your body remains without them, the greater your risk of getting a severe infection. What are the causes? This condition can occur if your body uses up or destroys neutrophils faster than your bone marrow can make them. Neutropenia may be caused by: A bacterial or fungal infection. Allergic disorders. Reactions to some medicines. An autoimmune disease. An enlarged spleen. This condition can also occur if your bone marrow does not produce enough neutrophils. This problem may be caused by: Cancer. Cancer treatments, such as radiation or chemotherapy. Viral infections. Medicines, such as phenytoin. Vitamin B12 deficiency. Diseases of the bone marrow. Environmental toxins, such as insecticides. What are the signs or symptoms? This condition does not usually cause symptoms. If symptoms are present, they are usually caused by an underlying infection. Symptoms of an infection may include: Fever. Chills. Swollen glands. Mouth ulcers. Cough. Rash or skin infection. Skin may be red, swollen, or painful. Abdominal or rectal pain. Frequent urination or pain or burning with urination. Because neutropenia weakens the immune system, symptoms of infection may be reduced. It is important to be aware of any changes in your body and talk to your health care provider. How is this diagnosed? This condition is diagnosed based on your medical history and a physical exam. Tests will also be done, such as: A complete blood count (CBC). Bone marrow biopsy. This is collecting a sample of bone marrow for testing. A chest X-ray. A urine culture. A blood culture. How is this  treated? Treatment depends on the underlying cause and severity of your condition. Mild neutropenia may not require treatment. Treatment may include medicines, such as: Antibiotic medicine given through an IV. Antiviral medicines. Antifungal medicines. A medicine to increase production of neutrophils (colony-stimulating factor). You may get this medicine through an IV or by injection. Steroids given through an IV. If an underlying condition is causing neutropenia, you may need treatment for that condition. If medicines or cancer treatments are causing neutropenia, your health care provider may have you stop the medicines or treatment. Follow these instructions at home: Medicines  Take over-the-counter and prescription medicines only as told by your health care provider. Get an annual flu shot. Ask your health care provider whether you or anyone you live with needs any other vaccines. Eating and drinking Do not share food utensils. Do not eat unpasteurized foods. Do not eat raw or undercooked meat, eggs, or seafood. Do not eat unwashed, raw fruits or vegetables. Lifestyle Avoid exposure to groups of people or children. Avoid being around people who are sick. Avoid being around live plants or fresh flowers. Avoid being around dirt or dust, such as in construction areas or gardens. Wear gloves if you are going to do yard work or gardening. Do not provide direct care for pets. Avoid animal droppings. Do not clean litter boxes and bird cages. Do not have sex unless your health care provider has approved. Hygiene  Bathe daily. Clean the area between the genitals and the anus (perineal area) after you urinate or have a bowel movement. If you are male, wipe from front to back. Get regular dental care and brush your teeth with a soft toothbrush before and after meals. Do not use   a regular razor. Use an electric razor to remove hair. Dapper your hands often with soap and water for at least 20  seconds. Make sure others who come in contact with you also Naples their hands. If soap and water are not available, use hand sanitizer. General instructions Take steps to reduce your risk of injury or infection. Follow any precautions as told by your health care provider. Take actions to avoid cuts and burns. For example: Be cautious when you use knives. Always cut away from yourself. Keep knives in protective sheaths or guards when not in use. Use oven mitts when you cook with a hot stove, oven, or grill. Stand a safe distance away from open fires. Do not use tampons, enemas, or rectal suppositories unless your health care provider has approved. Keep all follow-up visits. This is important. Contact a health care provider if: You have a cough. You have a sore throat. You develop sores in your mouth or anus. You have a warm, red, or tender area on your skin. You have red streaks on the skin. You develop a rash. You have swollen lymph nodes. You have frequent or painful urination. You have vaginal discharge or itching. Get help right away if: You have a fever. You have chills or shaking. You have nausea or vomiting. You have a lot of fatigue. You have shortness of breath. Summary Neutropenia is a condition that occurs when you have a lower-than-normal level of a type of white blood cell (neutrophils) in your body. This condition can occur if your body uses up or destroys neutrophils faster than your bone marrow can make them. Treatment depends on the underlying cause and severity of your condition. Mild neutropenia may not require treatment. Follow any precautions as told by your health care provider to reduce your risk for injury or infection. This information is not intended to replace advice given to you by your health care provider. Make sure you discuss any questions you have with your health care provider. Document Revised: 12/11/2020 Document Reviewed: 12/11/2020 Elsevier Patient  Education  2024 [employer]  

## 2023-04-23 LAB — PROTEIN ELECTROPHORESIS, SERUM, WITH REFLEX
A/G Ratio: 0.8 (ref 0.7–1.7)
Albumin ELP: 3.5 g/dL (ref 2.9–4.4)
Alpha-1-Globulin: 0.3 g/dL (ref 0.0–0.4)
Alpha-2-Globulin: 0.8 g/dL (ref 0.4–1.0)
Beta Globulin: 0.9 g/dL (ref 0.7–1.3)
Gamma Globulin: 2.2 g/dL — ABNORMAL HIGH (ref 0.4–1.8)
Globulin, Total: 4.2 g/dL — ABNORMAL HIGH (ref 2.2–3.9)
M-Spike, %: 2 g/dL — ABNORMAL HIGH
SPEP Interpretation: 0
Total Protein ELP: 7.7 g/dL (ref 6.0–8.5)

## 2023-04-23 LAB — IMMUNOFIXATION REFLEX, SERUM
IgA: 23 mg/dL — ABNORMAL LOW (ref 61–437)
IgG (Immunoglobin G), Serum: 2669 mg/dL — ABNORMAL HIGH (ref 603–1613)
IgM (Immunoglobulin M), Srm: 17 mg/dL (ref 15–143)

## 2023-04-26 ENCOUNTER — Other Ambulatory Visit: Payer: Self-pay

## 2023-04-26 DIAGNOSIS — C9000 Multiple myeloma not having achieved remission: Secondary | ICD-10-CM

## 2023-04-26 MED ORDER — LENALIDOMIDE 15 MG PO CAPS: 15.0000 mg | ORAL_CAPSULE | Freq: Every day | ORAL | 0 refills | Status: DC

## 2023-04-29 ENCOUNTER — Inpatient Hospital Stay: Payer: Medicare Other | Admitting: Hematology & Oncology

## 2023-04-29 ENCOUNTER — Inpatient Hospital Stay: Payer: Medicare Other

## 2023-04-29 ENCOUNTER — Encounter: Payer: Self-pay | Admitting: Hematology & Oncology

## 2023-04-29 VITALS — BP 120/55 | HR 67 | Temp 97.9°F | Resp 20 | Ht 68.0 in | Wt 178.0 lb

## 2023-04-29 DIAGNOSIS — C9000 Multiple myeloma not having achieved remission: Secondary | ICD-10-CM | POA: Diagnosis not present

## 2023-04-29 DIAGNOSIS — Z5112 Encounter for antineoplastic immunotherapy: Secondary | ICD-10-CM | POA: Diagnosis not present

## 2023-04-29 LAB — CMP (CANCER CENTER ONLY)
ALT: 29 U/L (ref 0–44)
AST: 20 U/L (ref 15–41)
Albumin: 3.8 g/dL (ref 3.5–5.0)
Alkaline Phosphatase: 56 U/L (ref 38–126)
Anion gap: 9 (ref 5–15)
BUN: 25 mg/dL — ABNORMAL HIGH (ref 8–23)
CO2: 25 mmol/L (ref 22–32)
Calcium: 8.9 mg/dL (ref 8.9–10.3)
Chloride: 99 mmol/L (ref 98–111)
Creatinine: 1.16 mg/dL (ref 0.61–1.24)
GFR, Estimated: >60 mL/min (ref >60)
Glucose, Bld: 321 mg/dL — ABNORMAL HIGH (ref 70–99)
Potassium: 3.8 mmol/L (ref 3.5–5.1)
Sodium: 133 mmol/L — ABNORMAL LOW (ref 135–145)
Total Bilirubin: 0.4 mg/dL (ref 0.3–1.2)
Total Protein: 7.4 g/dL (ref 6.5–8.1)

## 2023-04-29 LAB — LACTATE DEHYDROGENASE: LDH: 122 U/L (ref 98–192)

## 2023-04-29 LAB — CBC WITH DIFFERENTIAL (CANCER CENTER ONLY)
Abs Immature Granulocytes: 0.01 10*3/uL (ref 0.00–0.07)
Basophils Absolute: 0 10*3/uL (ref 0.0–0.1)
Basophils Relative: 1 %
Eosinophils Absolute: 0.3 10*3/uL (ref 0.0–0.5)
Eosinophils Relative: 9 %
HCT: 33.5 % — ABNORMAL LOW (ref 39.0–52.0)
Hemoglobin: 11.4 g/dL — ABNORMAL LOW (ref 13.0–17.0)
Immature Granulocytes: 0 %
Lymphocytes Relative: 42 %
Lymphs Abs: 1.2 10*3/uL (ref 0.7–4.0)
MCH: 29.3 pg (ref 26.0–34.0)
MCHC: 34 g/dL (ref 30.0–36.0)
MCV: 86.1 fL (ref 80.0–100.0)
Monocytes Absolute: 0.3 10*3/uL (ref 0.1–1.0)
Monocytes Relative: 10 %
Neutro Abs: 1.1 10*3/uL — ABNORMAL LOW (ref 1.7–7.7)
Neutrophils Relative %: 38 %
Platelet Count: 170 10*3/uL (ref 150–400)
RBC: 3.89 MIL/uL — ABNORMAL LOW (ref 4.22–5.81)
RDW: 13.1 % (ref 11.5–15.5)
WBC Count: 2.8 10*3/uL — ABNORMAL LOW (ref 4.0–10.5)
nRBC: 0 % (ref 0.0–0.2)

## 2023-04-29 NOTE — Progress Notes (Signed)
Hematology and Oncology Follow Up Visit  [name] [id number] 1945-12-22 77 y.o. 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  He started his protocol.  Unfortunately, he has had some fatigue with this.  We can have to hold his treatment today.  He has responded nicely however.  His last monoclonal spike was down to 2 g/dL.  The IgG level was down to 2600 mg/dL.  The Kappa light chain was down to 12 mg/dL.   We will have to make an adjustment with his protocol.  We will give him a week off right now.  I suspect that the Revlimid might be the problem with his blood counts.  He has had no diarrhea.  He has had no rash.  He has had no leg swelling.  He has had no nausea or vomiting.  He has had no cough or shortness of breath.  There has been no chest wall pain.  Overall, I would say that his performance status prior ECOG 1.    Wt Readings from Last 3 Encounters:  04/29/23 178 lb (80.7 kg)  04/15/23 180 lb (81.6 kg)  03/31/23 185 lb (83.9 kg)    Medications:  Allergies as of 04/29/2023       Reactions   Clindamycin Hcl Itching   Famvir [famciclovir] Itching   Metoprolol Succinate Other (See Comments)   Pt does not recall.        Medication List        Accurate as of April 29, 2023 10:58 AM. If you have any questions, ask your nurse or doctor.          STOP taking these medications    famciclovir 250 MG tablet Commonly known as: FAMVIR Stopped by: Josph Macho       TAKE these medications    acyclovir 200 MG capsule Commonly known as: ZOVIRAX Take 1 capsule (200 mg total) by mouth 2 (two) times daily.   amLODipine 10 MG tablet Commonly known as:  NORVASC Take 10 mg by mouth every morning.   aspirin EC 325 MG tablet Take 325 mg by mouth daily.   ezetimibe 10 MG tablet Commonly known as: ZETIA Take 10 mg by mouth daily.   furosemide 20 MG tablet Commonly known as: LASIX Take 20 mg by mouth daily.   lenalidomide 15 MG capsule Commonly known as: REVLIMID Take 1 capsule (15 mg total) by mouth daily. Take for 21 days, then 7 days off. Repeat every 28 days. [employer] Auth # [id number]; Date Obtained 04/26/2023   levofloxacin 500 MG tablet Commonly known as: LEVAQUIN Take 1 tablet (500 mg total) by mouth daily.   lidocaine-prilocaine cream Commonly known as: EMLA Apply to affected area once   metFORMIN 500 MG 24 hr tablet Commonly known as: GLUCOPHAGE-XR Take 4 tablets by mouth daily.   Mounjaro 2.5 MG/0.5ML Pen Generic drug: tirzepatide Inject 2.5 mg into the skin once a week.   omeprazole 20 MG capsule Commonly known as: PRILOSEC Take 20 mg by mouth 2 (two) times daily before a meal.   ondansetron 8 MG tablet Commonly known as: Zofran Take 1 tablet (8 mg total) by mouth every  8 (eight) hours as needed for nausea or vomiting.   prochlorperazine 10 MG tablet Commonly known as: COMPAZINE Take 1 tablet (10 mg total) by mouth every 6 (six) hours as needed for nausea or vomiting.   rosuvastatin 40 MG tablet Commonly known as: CRESTOR Take 40 mg by mouth daily.   Testosterone 25 MG/2.5GM (1%) Gel Place 1 mg onto the skin every other day.   triamterene-hydrochlorothiazide 37.5-25 MG tablet Commonly known as: MAXZIDE-25 Take 1 tablet by mouth every morning.   valsartan 320 MG tablet Commonly known as: DIOVAN Take 320 mg by mouth daily.        Allergies:  Allergies  Allergen Reactions   Clindamycin Hcl Itching   Famvir [Famciclovir] Itching   Metoprolol Succinate Other (See Comments)    Pt does not recall.    Past Medical History, Surgical history, Social history, and Family History were reviewed and  updated.  Review of Systems: Review of Systems  Constitutional: Negative.   HENT: Negative.    Eyes: Negative.   Respiratory: Negative.    Cardiovascular: Negative.   Gastrointestinal: Negative.   Genitourinary: Negative.   Musculoskeletal: Negative.   Skin: Negative.   Neurological: Negative.   Endo/Heme/Allergies: Negative.   Psychiatric/Behavioral: Negative.        Physical Exam:  height is 5\' 8"  (1.727 m) and weight is 178 lb (80.7 kg). His oral temperature is 97.9 F (36.6 C). His blood pressure is 120/55 (abnormal) and his pulse is 67. His respiration is 20 and oxygen saturation is 99%.    Wt Readings from Last 3 Encounters:  04/29/23 178 lb (80.7 kg)  04/15/23 180 lb (81.6 kg)  03/31/23 185 lb (83.9 kg)    Physical Exam Vitals reviewed.  HENT:     Head: Normocephalic and atraumatic.  Eyes:     Pupils: Pupils are equal, round, and reactive to light.  Cardiovascular:     Rate and Rhythm: Normal rate and regular rhythm.     Heart sounds: Normal heart sounds.  Pulmonary:     Effort: Pulmonary effort is normal.     Breath sounds: Normal breath sounds.  Abdominal:     General: Bowel sounds are normal.     Palpations: Abdomen is soft.  Musculoskeletal:        General: No tenderness or deformity. Normal range of motion.     Cervical back: Normal range of motion.  Lymphadenopathy:     Cervical: No cervical adenopathy.  Skin:    General: Skin is warm and dry.     Findings: No erythema or rash.  Neurological:     Mental Status: He is alert and oriented to person, place, and time.  Psychiatric:        Behavior: Behavior normal.        Thought Content: Thought content normal.        Judgment: Judgment normal.      Lab Results  Component Value Date   WBC 2.8 (L) 04/29/2023   HGB 11.4 (L) 04/29/2023   HCT 33.5 (L) 04/29/2023   MCV 86.1 04/29/2023   PLT 170 04/29/2023   Lab Results  Component Value Date   FERRITIN 46 08/31/2022   IRON 86 08/31/2022    TIBC 395 08/31/2022   UIBC 309 08/31/2022   IRONPCTSAT 22 08/31/2022   Lab Results  Component Value Date   RETICCTPCT 2.3 08/31/2022   RBC 3.89 (L) 04/29/2023   Lab Results  Component Value Date   KPAFRELGTCHN 119.7 (H)  04/15/2023   LAMBDASER 8.7 04/15/2023   KAPLAMBRATIO 13.76 (H) 04/15/2023   Lab Results  Component Value Date   IGGSERUM 2,574 (H) 04/15/2023   IGGSERUM 2,669 (H) 04/15/2023   IGA 23 (L) 04/15/2023   IGA 23 (L) 04/15/2023   IGMSERUM 16 04/15/2023   IGMSERUM 17 04/15/2023   Lab Results  Component Value Date   TOTALPROTELP 7.7 04/15/2023   ALBUMINELP 3.5 04/15/2023   A1GS 0.3 04/15/2023   A2GS 0.8 04/15/2023   BETS 0.9 04/15/2023   GAMS 2.2 (H) 04/15/2023   MSPIKE 2.0 (H) 04/15/2023   SPEI Comment 02/06/2021     Chemistry      Component Value Date/Time   NA 133 (L) 04/29/2023 0938   NA 139 06/20/2020 0923   K 3.8 04/29/2023 0938   CL 99 04/29/2023 0938   CO2 25 04/29/2023 0938   BUN 25 (H) 04/29/2023 0938   BUN 17 06/20/2020 0923   CREATININE 1.16 04/29/2023 0938   CREATININE 1.12 09/03/2015 1201      Component Value Date/Time   CALCIUM 8.9 04/29/2023 0938   ALKPHOS 56 04/29/2023 0938   AST 20 04/29/2023 0938   ALT 29 04/29/2023 0938   BILITOT 0.4 04/29/2023 0938       Impression and Plan: Mr. Munro is a very pleasant 77 yo caucasian gentleman with IgG kappa multiple myeloma.  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  His FISH panel was negative. Currently he is on daratumumab/Velcade/Revlimid without Decadron.   Again, we will not treat him today.  Will give an extra week off.  We may have to make some adjustments with his protocol along see him back.     Josph Macho, MD 10/31/202410:58 AM

## 2023-05-01 LAB — IGG, IGA, IGM
IgA: 25 mg/dL — ABNORMAL LOW (ref 61–437)
IgG (Immunoglobin G), Serum: 2120 mg/dL — ABNORMAL HIGH (ref 603–1613)
IgM (Immunoglobulin M), Srm: 16 mg/dL (ref 15–143)

## 2023-05-02 LAB — PROTEIN ELECTROPHORESIS, SERUM
A/G Ratio: 0.8 (ref 0.7–1.7)
Albumin ELP: 3.1 g/dL (ref 2.9–4.4)
Alpha-1-Globulin: 0.3 g/dL (ref 0.0–0.4)
Alpha-2-Globulin: 0.8 g/dL (ref 0.4–1.0)
Beta Globulin: 0.8 g/dL (ref 0.7–1.3)
Gamma Globulin: 1.8 g/dL (ref 0.4–1.8)
Globulin, Total: 3.7 g/dL (ref 2.2–3.9)
M-Spike, %: 1.6 g/dL — ABNORMAL HIGH
Total Protein ELP: 6.8 g/dL (ref 6.0–8.5)

## 2023-05-03 LAB — KAPPA/LAMBDA LIGHT CHAINS
Kappa free light chain: 124.1 mg/L — ABNORMAL HIGH (ref 3.3–19.4)
Kappa, lambda light chain ratio: 13.94 — ABNORMAL HIGH (ref 0.26–1.65)
Lambda free light chains: 8.9 mg/L (ref 5.7–26.3)

## 2023-05-06 ENCOUNTER — Inpatient Hospital Stay: Payer: Medicare Other | Admitting: Hematology & Oncology

## 2023-05-06 ENCOUNTER — Inpatient Hospital Stay: Payer: Medicare Other

## 2023-05-06 ENCOUNTER — Inpatient Hospital Stay: Payer: Medicare Other | Attending: Hematology & Oncology

## 2023-05-06 ENCOUNTER — Encounter: Payer: Self-pay | Admitting: Hematology & Oncology

## 2023-05-06 ENCOUNTER — Other Ambulatory Visit: Payer: Self-pay

## 2023-05-06 VITALS — BP 119/58 | HR 68 | Temp 97.9°F | Resp 18

## 2023-05-06 VITALS — Ht 68.0 in | Wt 180.0 lb

## 2023-05-06 DIAGNOSIS — Z79899 Other long term (current) drug therapy: Secondary | ICD-10-CM | POA: Insufficient documentation

## 2023-05-06 DIAGNOSIS — C9000 Multiple myeloma not having achieved remission: Secondary | ICD-10-CM | POA: Diagnosis present

## 2023-05-06 DIAGNOSIS — Z5112 Encounter for antineoplastic immunotherapy: Secondary | ICD-10-CM | POA: Insufficient documentation

## 2023-05-06 DIAGNOSIS — D500 Iron deficiency anemia secondary to blood loss (chronic): Secondary | ICD-10-CM | POA: Diagnosis not present

## 2023-05-06 DIAGNOSIS — R739 Hyperglycemia, unspecified: Secondary | ICD-10-CM

## 2023-05-06 DIAGNOSIS — Z7961 Long term (current) use of immunomodulator: Secondary | ICD-10-CM | POA: Insufficient documentation

## 2023-05-06 DIAGNOSIS — T50905A Adverse effect of unspecified drugs, medicaments and biological substances, initial encounter: Secondary | ICD-10-CM

## 2023-05-06 DIAGNOSIS — Q999 Chromosomal abnormality, unspecified: Secondary | ICD-10-CM | POA: Insufficient documentation

## 2023-05-06 LAB — CBC WITH DIFFERENTIAL (CANCER CENTER ONLY)
Abs Immature Granulocytes: 0.01 10*3/uL (ref 0.00–0.07)
Basophils Absolute: 0 10*3/uL (ref 0.0–0.1)
Basophils Relative: 1 %
Eosinophils Absolute: 0.3 10*3/uL (ref 0.0–0.5)
Eosinophils Relative: 9 %
HCT: 31.8 % — ABNORMAL LOW (ref 39.0–52.0)
Hemoglobin: 10.9 g/dL — ABNORMAL LOW (ref 13.0–17.0)
Immature Granulocytes: 0 %
Lymphocytes Relative: 46 %
Lymphs Abs: 1.5 10*3/uL (ref 0.7–4.0)
MCH: 29.8 pg (ref 26.0–34.0)
MCHC: 34.3 g/dL (ref 30.0–36.0)
MCV: 86.9 fL (ref 80.0–100.0)
Monocytes Absolute: 0.2 10*3/uL (ref 0.1–1.0)
Monocytes Relative: 7 %
Neutro Abs: 1.2 10*3/uL — ABNORMAL LOW (ref 1.7–7.7)
Neutrophils Relative %: 37 %
Platelet Count: 176 10*3/uL (ref 150–400)
RBC: 3.66 MIL/uL — ABNORMAL LOW (ref 4.22–5.81)
RDW: 13.8 % (ref 11.5–15.5)
WBC Count: 3.3 10*3/uL — ABNORMAL LOW (ref 4.0–10.5)
nRBC: 0 % (ref 0.0–0.2)

## 2023-05-06 LAB — CMP (CANCER CENTER ONLY)
ALT: 17 U/L (ref 0–44)
AST: 15 U/L (ref 15–41)
Albumin: 3.8 g/dL (ref 3.5–5.0)
Alkaline Phosphatase: 46 U/L (ref 38–126)
Anion gap: 7 (ref 5–15)
BUN: 15 mg/dL (ref 8–23)
CO2: 28 mmol/L (ref 22–32)
Calcium: 9 mg/dL (ref 8.9–10.3)
Chloride: 99 mmol/L (ref 98–111)
Creatinine: 1 mg/dL (ref 0.61–1.24)
GFR, Estimated: >60 mL/min (ref >60)
Glucose, Bld: 272 mg/dL — ABNORMAL HIGH (ref 70–99)
Potassium: 3.7 mmol/L (ref 3.5–5.1)
Sodium: 134 mmol/L — ABNORMAL LOW (ref 135–145)
Total Bilirubin: 0.4 mg/dL (ref <1.2)
Total Protein: 7.1 g/dL (ref 6.5–8.1)

## 2023-05-06 MED ORDER — DIPHENHYDRAMINE HCL 25 MG PO CAPS
50.0000 mg | ORAL_CAPSULE | Freq: Once | ORAL | Status: AC
  Administered 2023-05-06: 50 mg via ORAL
  Filled 2023-05-06: qty 2

## 2023-05-06 MED ORDER — ACETAMINOPHEN 325 MG PO TABS
650.0000 mg | ORAL_TABLET | Freq: Once | ORAL | Status: AC
  Administered 2023-05-06: 650 mg via ORAL
  Filled 2023-05-06: qty 2

## 2023-05-06 MED ORDER — INSULIN ASPART 100 UNIT/ML IJ SOLN
12.0000 [IU] | Freq: Once | INTRAMUSCULAR | Status: DC
  Filled 2023-05-06: qty 0.12

## 2023-05-06 MED ORDER — INSULIN ASPART 100 UNIT/ML IJ SOLN: 12.0000 [IU] | Freq: Once | INTRAMUSCULAR | Status: DC

## 2023-05-06 MED ORDER — DARATUMUMAB-HYALURONIDASE-FIHJ 1800-30000 MG-UT/15ML ~~LOC~~ SOLN
1800.0000 mg | Freq: Once | SUBCUTANEOUS | Status: AC
  Administered 2023-05-06: 1800 mg via SUBCUTANEOUS
  Filled 2023-05-06: qty 15

## 2023-05-06 MED ORDER — BORTEZOMIB CHEMO SQ INJECTION 3.5 MG (2.5MG/ML)
1.3000 mg/m2 | Freq: Once | INTRAMUSCULAR | Status: AC
Start: 2023-05-06 — End: 2023-05-06
  Administered 2023-05-06: 2.5 mg via SUBCUTANEOUS
  Filled 2023-05-06: qty 1

## 2023-05-06 NOTE — Progress Notes (Signed)
Hematology and Oncology Follow Up Visit  [name] [id number] 11/07/1945 77 y.o. 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  His white cell count is coming up slowly.  His ANC is 1.2.  I think that the rabbi is clearly doing this.  I will have him take Revlimid 2 weeks on and 2 weeks off.  I think this will help.  Otherwise, he is doing okay.  He is responding to treatment.  His last monoclonal spike was not a 1.6 g/dL.  The IgG level was 2100 mg/dL.  The Kappa light chain was 12.4 mg/dL.  He feels okay.  His blood sugars are quite high.  I think this can be his biggest problem in the long-term.  He will get some insulin today.  He has had no problems with bowels or bladder.  He does have some urinary frequency because of the hyperglycemia.  He has had no neuropathy.  He has had no rashes.  There is been no bleeding.  There is no cough or shortness of breath.  Overall, his performance status is ECOG 1.     Wt Readings from Last 3 Encounters:  05/06/23 180 lb (81.6 kg)  04/29/23 178 lb (80.7 kg)  04/15/23 180 lb (81.6 kg)    Medications:  Allergies as of 05/06/2023       Reactions   Clindamycin Hcl Itching   Famvir [famciclovir] Itching   Metoprolol Succinate Other (See Comments)   Pt does not recall.        Medication List        Accurate as of May 06, 2023 10:02 AM. If you have any questions, ask your nurse or doctor.          STOP taking these medications    levofloxacin 500 MG tablet Commonly known as: LEVAQUIN Stopped by: Josph Macho   lidocaine-prilocaine cream Commonly  known as: EMLA Stopped by: Josph Macho       TAKE these medications    acyclovir 200 MG capsule Commonly known as: ZOVIRAX Take 1 capsule (200 mg total) by mouth 2 (two) times daily.   amLODipine 10 MG tablet Commonly known as: NORVASC Take 10 mg by mouth every morning.   aspirin EC 325 MG tablet Take 325 mg by mouth daily.   ezetimibe 10 MG tablet Commonly known as: ZETIA Take 10 mg by mouth daily.   furosemide 20 MG tablet Commonly known as: LASIX Take 20 mg by mouth daily.   lenalidomide 15 MG capsule Commonly known as: REVLIMID Take 1 capsule (15 mg total) by mouth daily. Take for 21 days, then 7 days off. Repeat every 28 days. [employer] Auth # [id number]; Date Obtained 04/26/2023   metFORMIN 500 MG 24 hr tablet Commonly known as: GLUCOPHAGE-XR Take 4 tablets by mouth daily.   Mounjaro 2.5 MG/0.5ML Pen Generic drug: tirzepatide Inject 2.5 mg into the skin once a week.   omeprazole 20 MG capsule Commonly known as: PRILOSEC Take 20 mg by mouth 2 (two) times daily before a meal.   ondansetron 8 MG tablet Commonly  known as: Zofran Take 1 tablet (8 mg total) by mouth every 8 (eight) hours as needed for nausea or vomiting.   prochlorperazine 10 MG tablet Commonly known as: COMPAZINE Take 1 tablet (10 mg total) by mouth every 6 (six) hours as needed for nausea or vomiting.   rosuvastatin 40 MG tablet Commonly known as: CRESTOR Take 40 mg by mouth daily.   Testosterone 25 MG/2.5GM (1%) Gel Place 1 mg onto the skin every other day.   triamterene-hydrochlorothiazide 37.5-25 MG tablet Commonly known as: MAXZIDE-25 Take 1 tablet by mouth every morning.   valsartan 320 MG tablet Commonly known as: DIOVAN Take 320 mg by mouth daily.        Allergies:  Allergies  Allergen Reactions   Clindamycin Hcl Itching   Famvir [Famciclovir] Itching   Metoprolol Succinate Other (See Comments)    Pt does not recall.    Past Medical History, Surgical history,  Social history, and Family History were reviewed and updated.  Review of Systems: Review of Systems  Constitutional: Negative.   HENT: Negative.    Eyes: Negative.   Respiratory: Negative.    Cardiovascular: Negative.   Gastrointestinal: Negative.   Genitourinary: Negative.   Musculoskeletal: Negative.   Skin: Negative.   Neurological: Negative.   Endo/Heme/Allergies: Negative.   Psychiatric/Behavioral: Negative.        Physical Exam:  height is 5\' 8"  (1.727 m) and weight is 180 lb (81.6 kg).    Wt Readings from Last 3 Encounters:  05/06/23 180 lb (81.6 kg)  04/29/23 178 lb (80.7 kg)  04/15/23 180 lb (81.6 kg)    Physical Exam Vitals reviewed.  HENT:     Head: Normocephalic and atraumatic.  Eyes:     Pupils: Pupils are equal, round, and reactive to light.  Cardiovascular:     Rate and Rhythm: Normal rate and regular rhythm.     Heart sounds: Normal heart sounds.  Pulmonary:     Effort: Pulmonary effort is normal.     Breath sounds: Normal breath sounds.  Abdominal:     General: Bowel sounds are normal.     Palpations: Abdomen is soft.  Musculoskeletal:        General: No tenderness or deformity. Normal range of motion.     Cervical back: Normal range of motion.  Lymphadenopathy:     Cervical: No cervical adenopathy.  Skin:    General: Skin is warm and dry.     Findings: No erythema or rash.  Neurological:     Mental Status: He is alert and oriented to person, place, and time.  Psychiatric:        Behavior: Behavior normal.        Thought Content: Thought content normal.        Judgment: Judgment normal.      Lab Results  Component Value Date   WBC 3.3 (L) 05/06/2023   HGB 10.9 (L) 05/06/2023   HCT 31.8 (L) 05/06/2023   MCV 86.9 05/06/2023   PLT 176 05/06/2023   Lab Results  Component Value Date   FERRITIN 46 08/31/2022   IRON 86 08/31/2022   TIBC 395 08/31/2022   UIBC 309 08/31/2022   IRONPCTSAT 22 08/31/2022   Lab Results  Component Value  Date   RETICCTPCT 2.3 08/31/2022   RBC 3.66 (L) 05/06/2023   Lab Results  Component Value Date   KPAFRELGTCHN 124.1 (H) 04/29/2023   LAMBDASER 8.9 04/29/2023   KAPLAMBRATIO 13.94 (H) 04/29/2023   Lab Results  Component Value Date   IGGSERUM 2,120 (H) 04/29/2023   IGA 25 (L) 04/29/2023   IGMSERUM 16 04/29/2023   Lab Results  Component Value Date   TOTALPROTELP 6.8 04/29/2023   ALBUMINELP 3.1 04/29/2023   A1GS 0.3 04/29/2023   A2GS 0.8 04/29/2023   BETS 0.8 04/29/2023   GAMS 1.8 04/29/2023   MSPIKE 1.6 (H) 04/29/2023   SPEI Comment 04/29/2023     Chemistry      Component Value Date/Time   NA 134 (L) 05/06/2023 0849   NA 139 06/20/2020 0923   K 3.7 05/06/2023 0849   CL 99 05/06/2023 0849   CO2 28 05/06/2023 0849   BUN 15 05/06/2023 0849   BUN 17 06/20/2020 0923   CREATININE 1.00 05/06/2023 0849   CREATININE 1.12 09/03/2015 1201      Component Value Date/Time   CALCIUM 9.0 05/06/2023 0849   ALKPHOS 46 05/06/2023 0849   AST 15 05/06/2023 0849   ALT 17 05/06/2023 0849   BILITOT 0.4 05/06/2023 0849       Impression and Plan: Mr. Oran is a very pleasant 77 yo caucasian gentleman with IgG kappa multiple myeloma.  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  His FISH panel was negative. Currently he is on daratumumab/Velcade/Revlimid without Decadron.   Again, I will decrease his Revlimid to  2 weeks on and 2 weeks off.  I think this will help.    His myeloma levels are clearly are improving.  I told him that we will be able to pull back on his treatment in the future.  I probably would keep him on this protocol right now through the end of the year.  We will then be able to start making some adjustments.  I will plan to see him back in a month.    Josph Macho, MD 11/7/202410:02 AM

## 2023-05-06 NOTE — Progress Notes (Signed)
OK to treat with ANC-1.2 per order of Dr. Marin Olp.

## 2023-05-06 NOTE — Patient Instructions (Signed)
[facility] - A DEPT OF [facility]  Discharge Instructions: Thank you for choosing [facility] to provide your oncology and hematology care.   If you have a lab appointment with the Cancer Center, please go directly to the Cancer Center and check in at the registration area.  Wear comfortable clothing and clothing appropriate for easy access to any Portacath or PICC line.   We strive to give you quality time with your provider. You may need to reschedule your appointment if you arrive late (15 or more minutes).  Arriving late affects you and other patients whose appointments are after yours.  Also, if you miss three or more appointments without notifying the office, you may be dismissed from the clinic at the provider's discretion.      For prescription refill requests, have your pharmacy contact our office and allow 72 hours for refills to be completed.    Today you received the following chemotherapy and/or immunotherapy agents:  Faspro and Velcade      To help prevent nausea and vomiting after your treatment, we encourage you to take your nausea medication as directed.  BELOW ARE SYMPTOMS THAT SHOULD BE REPORTED IMMEDIATELY: *FEVER GREATER THAN 100.4 F (38 C) OR HIGHER *CHILLS OR SWEATING *NAUSEA AND VOMITING THAT IS NOT CONTROLLED WITH YOUR NAUSEA MEDICATION *UNUSUAL SHORTNESS OF BREATH *UNUSUAL BRUISING OR BLEEDING *URINARY PROBLEMS (pain or burning when urinating, or frequent urination) *BOWEL PROBLEMS (unusual diarrhea, constipation, pain near the anus) TENDERNESS IN MOUTH AND THROAT WITH OR WITHOUT PRESENCE OF ULCERS (sore throat, sores in mouth, or a toothache) UNUSUAL RASH, SWELLING OR PAIN  UNUSUAL VAGINAL DISCHARGE OR ITCHING   Items with * indicate a potential emergency and should be followed up as soon as possible or go to the Emergency Department if any problems should occur.  Please show the CHEMOTHERAPY ALERT CARD or  IMMUNOTHERAPY ALERT CARD at check-in to the Emergency Department and triage nurse. Should you have questions after your visit or need to cancel or reschedule your appointment, please contact [facility] - A DEPT OF [facility]  [phone] and follow the prompts.  Office hours are 8:00 a.m. to 4:30 p.m. Monday - Friday. Please note that voicemails left after 4:00 p.m. may not be returned until the following business day.  We are closed weekends and major holidays. You have access to a nurse at all times for urgent questions. Please call the main number to the clinic [phone] and follow the prompts.  For any non-urgent questions, you may also contact your provider using MyChart. We now offer e-Visits for anyone 80 and older to request care online for non-urgent symptoms. For details visit [email or web address].   Also download the MyChart app! Go to the app store, search "MyChart", open the app, select [place], and log in with your MyChart username and password.

## 2023-05-13 ENCOUNTER — Inpatient Hospital Stay: Payer: Medicare Other

## 2023-05-13 ENCOUNTER — Encounter: Payer: Self-pay | Admitting: Medical Oncology

## 2023-05-13 ENCOUNTER — Inpatient Hospital Stay: Payer: Medicare Other | Admitting: Medical Oncology

## 2023-05-13 VITALS — BP 121/56 | HR 67 | Temp 98.1°F | Resp 17 | Wt 179.1 lb

## 2023-05-13 DIAGNOSIS — D500 Iron deficiency anemia secondary to blood loss (chronic): Secondary | ICD-10-CM | POA: Diagnosis not present

## 2023-05-13 DIAGNOSIS — R739 Hyperglycemia, unspecified: Secondary | ICD-10-CM

## 2023-05-13 DIAGNOSIS — C9000 Multiple myeloma not having achieved remission: Secondary | ICD-10-CM

## 2023-05-13 DIAGNOSIS — Z5112 Encounter for antineoplastic immunotherapy: Secondary | ICD-10-CM | POA: Diagnosis not present

## 2023-05-13 DIAGNOSIS — D649 Anemia, unspecified: Secondary | ICD-10-CM

## 2023-05-13 LAB — CBC WITH DIFFERENTIAL (CANCER CENTER ONLY)
Abs Immature Granulocytes: 0.01 10*3/uL (ref 0.00–0.07)
Basophils Absolute: 0 10*3/uL (ref 0.0–0.1)
Basophils Relative: 1 %
Eosinophils Absolute: 0.3 10*3/uL (ref 0.0–0.5)
Eosinophils Relative: 6 %
HCT: 33.1 % — ABNORMAL LOW (ref 39.0–52.0)
Hemoglobin: 11.2 g/dL — ABNORMAL LOW (ref 13.0–17.0)
Immature Granulocytes: 0 %
Lymphocytes Relative: 35 %
Lymphs Abs: 1.7 10*3/uL (ref 0.7–4.0)
MCH: 29.6 pg (ref 26.0–34.0)
MCHC: 33.8 g/dL (ref 30.0–36.0)
MCV: 87.6 fL (ref 80.0–100.0)
Monocytes Absolute: 0.5 10*3/uL (ref 0.1–1.0)
Monocytes Relative: 10 %
Neutro Abs: 2.4 10*3/uL (ref 1.7–7.7)
Neutrophils Relative %: 48 %
Platelet Count: 116 10*3/uL — ABNORMAL LOW (ref 150–400)
RBC: 3.78 MIL/uL — ABNORMAL LOW (ref 4.22–5.81)
RDW: 13.6 % (ref 11.5–15.5)
WBC Count: 5 10*3/uL (ref 4.0–10.5)
nRBC: 0 % (ref 0.0–0.2)

## 2023-05-13 LAB — LACTATE DEHYDROGENASE: LDH: 130 U/L (ref 98–192)

## 2023-05-13 LAB — CMP (CANCER CENTER ONLY)
ALT: 17 U/L (ref 0–44)
AST: 15 U/L (ref 15–41)
Albumin: 4.1 g/dL (ref 3.5–5.0)
Alkaline Phosphatase: 49 U/L (ref 38–126)
Anion gap: 9 (ref 5–15)
BUN: 19 mg/dL (ref 8–23)
CO2: 27 mmol/L (ref 22–32)
Calcium: 9.4 mg/dL (ref 8.9–10.3)
Chloride: 99 mmol/L (ref 98–111)
Creatinine: 1.35 mg/dL — ABNORMAL HIGH (ref 0.61–1.24)
GFR, Estimated: 53 mL/min — ABNORMAL LOW (ref >60)
Glucose, Bld: 263 mg/dL — ABNORMAL HIGH (ref 70–99)
Potassium: 3.9 mmol/L (ref 3.5–5.1)
Sodium: 135 mmol/L (ref 135–145)
Total Bilirubin: 0.4 mg/dL (ref <1.2)
Total Protein: 7.3 g/dL (ref 6.5–8.1)

## 2023-05-13 MED ORDER — BORTEZOMIB CHEMO SQ INJECTION 3.5 MG (2.5MG/ML)
1.3000 mg/m2 | Freq: Once | INTRAMUSCULAR | Status: AC
  Administered 2023-05-13: 2.5 mg via SUBCUTANEOUS
  Filled 2023-05-13: qty 1

## 2023-05-13 MED ORDER — DARATUMUMAB-HYALURONIDASE-FIHJ 1800-30000 MG-UT/15ML ~~LOC~~ SOLN
1800.0000 mg | Freq: Once | SUBCUTANEOUS | Status: AC
  Administered 2023-05-13: 1800 mg via SUBCUTANEOUS
  Filled 2023-05-13: qty 15

## 2023-05-13 MED ORDER — ACETAMINOPHEN 325 MG PO TABS
650.0000 mg | ORAL_TABLET | Freq: Once | ORAL | Status: AC
  Administered 2023-05-13: 650 mg via ORAL
  Filled 2023-05-13: qty 2

## 2023-05-13 MED ORDER — DIPHENHYDRAMINE HCL 25 MG PO CAPS
50.0000 mg | ORAL_CAPSULE | Freq: Once | ORAL | Status: AC
  Administered 2023-05-13: 50 mg via ORAL
  Filled 2023-05-13: qty 2

## 2023-05-13 NOTE — Patient Instructions (Signed)
[facility] - [facility]  Discharge Instructions: Thank you for choosing [facility] to provide your oncology and hematology care.   If you have a lab appointment with the Cancer Center, please go directly to the Cancer Center and check in at the registration area.  Wear comfortable clothing and clothing appropriate for easy access to any Portacath or PICC line.   We strive to give you quality time with your provider. You may need to reschedule your appointment if you arrive late (15 or more minutes).  Arriving late affects you and other patients whose appointments are after yours.  Also, if you miss three or more appointments without notifying the office, you may be dismissed from the clinic at the provider's discretion.      For prescription refill requests, have your pharmacy contact our office and allow 72 hours for refills to be completed.    Today you received the following chemotherapy and/or immunotherapy agents Velcade/Darzalex      To help prevent nausea and vomiting after your treatment, we encourage you to take your nausea medication as directed.  BELOW ARE SYMPTOMS THAT SHOULD BE REPORTED IMMEDIATELY: *FEVER GREATER THAN 100.4 F (38 C) OR HIGHER *CHILLS OR SWEATING *NAUSEA AND VOMITING THAT IS NOT CONTROLLED WITH YOUR NAUSEA MEDICATION *UNUSUAL SHORTNESS OF BREATH *UNUSUAL BRUISING OR BLEEDING *URINARY PROBLEMS (pain or burning when urinating, or frequent urination) *BOWEL PROBLEMS (unusual diarrhea, constipation, pain near the anus) TENDERNESS IN MOUTH AND THROAT WITH OR WITHOUT PRESENCE OF ULCERS (sore throat, sores in mouth, or a toothache) UNUSUAL RASH, SWELLING OR PAIN  UNUSUAL VAGINAL DISCHARGE OR ITCHING   Items with * indicate a potential emergency and should be followed up as soon as possible or go to the Emergency Department if any problems should occur.  Please show the CHEMOTHERAPY ALERT CARD or  IMMUNOTHERAPY ALERT CARD at check-in to the Emergency Department and triage nurse. Should you have questions after your visit or need to cancel or reschedule your appointment, please contact [facility] - A DEPT OF [facility]  [phone] and follow the prompts.  Office hours are 8:00 a.m. to 4:30 p.m. Monday - Friday. Please note that voicemails left after 4:00 p.m. may not be returned until the following business day.  We are closed weekends and major holidays. You have access to a nurse at all times for urgent questions. Please call the main number to the clinic [phone] and follow the prompts.  For any non-urgent questions, you may also contact your provider using MyChart. We now offer e-Visits for anyone 64 and older to request care online for non-urgent symptoms. For details visit [email or web address].   Also download the MyChart app! Go to the app store, search "MyChart", open the app, select , and log in with your MyChart username and password.

## 2023-05-13 NOTE — Progress Notes (Signed)
Hematology and Oncology Follow Up Visit  [name] [id number] 1945-10-14 77 y.o. 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    Current regimen is Revlimid 2 weeks on and 2 weeks off to help combat pancytopenia.   Today he reports that he is feeling well. He is responding to treatment.  His last monoclonal spike was 1.6 g/dL.  The IgG level was 2120 mg/dL.  The Kappa light chain was 12.4 mg/dL.  He feels okay.  Blood sugars run high.  He will get some insulin today.  He has had no problems with bowels or bladder.  He does have some urinary frequency because of the hyperglycemia.  He has had no neuropathy of hands. Has neuropathy of his feet secondary to his DM2.   He has had no rashes.  There is been no bleeding.  There is no cough or shortness of breath.  Appetite is low to fair.   Overall, his performance status is ECOG 1.     Wt Readings from Last 3 Encounters:  05/13/23 179 lb 1.3 oz (81.2 kg)  05/06/23 180 lb (81.6 kg)  04/29/23 178 lb (80.7 kg)    Medications:  Allergies as of 05/13/2023       Reactions   Clindamycin Hcl Itching   Famvir [famciclovir] Itching   Metoprolol Succinate Other (See Comments)   Pt does not recall.        Medication List        Accurate as of May 13, 2023 10:16 AM. If you have any questions, ask your nurse or doctor.          acyclovir 200 MG capsule Commonly known as: ZOVIRAX Take 1 capsule (200 mg total) by mouth 2 (two) times daily.   amLODipine 10 MG tablet Commonly known as: NORVASC Take 10 mg by mouth every morning.   aspirin EC 325 MG  tablet Take 325 mg by mouth daily.   ezetimibe 10 MG tablet Commonly known as: ZETIA Take 10 mg by mouth daily.   furosemide 20 MG tablet Commonly known as: LASIX Take 20 mg by mouth daily.   lenalidomide 15 MG capsule Commonly known as: REVLIMID Take 1 capsule (15 mg total) by mouth daily. Take for 21 days, then 7 days off. Repeat every 28 days. [employer] Auth # [id number]; Date Obtained 04/26/2023   metFORMIN 500 MG 24 hr tablet Commonly known as: GLUCOPHAGE-XR Take 4 tablets by mouth daily.   Mounjaro 2.5 MG/0.5ML Pen Generic drug: tirzepatide Inject 2.5 mg into the skin once a week.   omeprazole 20 MG capsule Commonly known as: PRILOSEC Take 20 mg by mouth 2 (two) times daily before a meal.   ondansetron 8 MG tablet Commonly known as: Zofran Take 1 tablet (8 mg total) by mouth every 8 (eight) hours as needed for nausea or vomiting.   prochlorperazine 10 MG tablet Commonly known as: COMPAZINE Take 1 tablet (10 mg total) by mouth every 6 (six) hours as needed for nausea or vomiting.   rosuvastatin 40 MG tablet Commonly known as: CRESTOR Take 40 mg by mouth  daily.   Testosterone 25 MG/2.5GM (1%) Gel Place 1 mg onto the skin every other day.   triamterene-hydrochlorothiazide 37.5-25 MG tablet Commonly known as: MAXZIDE-25 Take 1 tablet by mouth every morning.   valsartan 320 MG tablet Commonly known as: DIOVAN Take 320 mg by mouth daily.        Allergies:  Allergies  Allergen Reactions   Clindamycin Hcl Itching   Famvir [Famciclovir] Itching   Metoprolol Succinate Other (See Comments)    Pt does not recall.    Past Medical History, Surgical history, Social history, and Family History were reviewed and updated.  Review of Systems: Review of Systems  Constitutional: Negative.   HENT: Negative.    Eyes: Negative.   Respiratory: Negative.    Cardiovascular: Negative.   Gastrointestinal: Negative.   Genitourinary: Negative.   Musculoskeletal: Negative.    Skin: Negative.   Neurological: Negative.   Endo/Heme/Allergies: Negative.   Psychiatric/Behavioral: Negative.        Physical Exam:  weight is 179 lb 1.3 oz (81.2 kg). His oral temperature is 98.1 F (36.7 C). His blood pressure is 121/56 (abnormal) and his pulse is 67. His respiration is 17 and oxygen saturation is 99%.    Wt Readings from Last 3 Encounters:  05/13/23 179 lb 1.3 oz (81.2 kg)  05/06/23 180 lb (81.6 kg)  04/29/23 178 lb (80.7 kg)    Physical Exam Vitals reviewed.  HENT:     Head: Normocephalic and atraumatic.  Eyes:     Pupils: Pupils are equal, round, and reactive to light.  Cardiovascular:     Rate and Rhythm: Normal rate and regular rhythm.     Heart sounds: Normal heart sounds.  Pulmonary:     Effort: Pulmonary effort is normal.     Breath sounds: Normal breath sounds.  Abdominal:     General: Bowel sounds are normal.     Palpations: Abdomen is soft.  Musculoskeletal:        General: No tenderness or deformity. Normal range of motion.     Cervical back: Normal range of motion.  Lymphadenopathy:     Cervical: No cervical adenopathy.  Skin:    General: Skin is warm and dry.     Findings: No erythema or rash.  Neurological:     Mental Status: He is alert and oriented to person, place, and time.  Psychiatric:        Behavior: Behavior normal.        Thought Content: Thought content normal.        Judgment: Judgment normal.      Lab Results  Component Value Date   WBC 5.0 05/13/2023   HGB 11.2 (L) 05/13/2023   HCT 33.1 (L) 05/13/2023   MCV 87.6 05/13/2023   PLT 116 (L) 05/13/2023   Lab Results  Component Value Date   FERRITIN 46 08/31/2022   IRON 86 08/31/2022   TIBC 395 08/31/2022   UIBC 309 08/31/2022   IRONPCTSAT 22 08/31/2022   Lab Results  Component Value Date   RETICCTPCT 2.3 08/31/2022   RBC 3.78 (L) 05/13/2023   Lab Results  Component Value Date   KPAFRELGTCHN 124.1 (H) 04/29/2023   LAMBDASER 8.9 04/29/2023    KAPLAMBRATIO 13.94 (H) 04/29/2023   Lab Results  Component Value Date   IGGSERUM 2,120 (H) 04/29/2023   IGA 25 (L) 04/29/2023   IGMSERUM 16 04/29/2023   Lab Results  Component Value Date   TOTALPROTELP 6.8 04/29/2023   ALBUMINELP 3.1 04/29/2023  A1GS 0.3 04/29/2023   A2GS 0.8 04/29/2023   BETS 0.8 04/29/2023   GAMS 1.8 04/29/2023   MSPIKE 1.6 (H) 04/29/2023   SPEI Comment 04/29/2023     Chemistry      Component Value Date/Time   NA 135 05/13/2023 0927   NA 139 06/20/2020 0923   K 3.9 05/13/2023 0927   CL 99 05/13/2023 0927   CO2 27 05/13/2023 0927   BUN 19 05/13/2023 0927   BUN 17 06/20/2020 0923   CREATININE 1.35 (H) 05/13/2023 0927   CREATININE 1.12 09/03/2015 1201      Component Value Date/Time   CALCIUM 9.4 05/13/2023 0927   ALKPHOS 49 05/13/2023 0927   AST 15 05/13/2023 0927   ALT 17 05/13/2023 0927   BILITOT 0.4 05/13/2023 1610     Encounter Diagnoses  Name Primary?   Multiple myeloma not having achieved remission (HCC) Yes   Drug-induced hyperglycemia    Normocytic anemia    Iron deficiency anemia due to chronic blood loss     Impression and Plan: [name] is a very pleasant 77 yo caucasian gentleman with IgG kappa multiple myeloma.  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  His FISH panel was negative. Currently he is on daratumumab/Velcade/Revlimid without Decadron.   He is due for treatment today. CBC looks good with much improved Hgb and WBC values. Platelets are acceptable for treatment today.    RTC 1 month APP, labs (CBC w/, CMP, LDH, SPEP, IgG, light chains, immunofixation)   Rushie Chestnut, PA-C 11/14/202410:17 AM

## 2023-05-14 LAB — IGG, IGA, IGM
IgA: 24 mg/dL — ABNORMAL LOW (ref 61–437)
IgG (Immunoglobin G), Serum: 1991 mg/dL — ABNORMAL HIGH (ref 603–1613)
IgM (Immunoglobulin M), Srm: 16 mg/dL (ref 15–143)

## 2023-05-14 LAB — KAPPA/LAMBDA LIGHT CHAINS
Kappa free light chain: 138.7 mg/L — ABNORMAL HIGH (ref 3.3–19.4)
Kappa, lambda light chain ratio: 13.21 — ABNORMAL HIGH (ref 0.26–1.65)
Lambda free light chains: 10.5 mg/L (ref 5.7–26.3)

## 2023-05-20 ENCOUNTER — Encounter: Payer: Self-pay | Admitting: Hematology & Oncology

## 2023-05-20 ENCOUNTER — Inpatient Hospital Stay: Payer: Medicare Other

## 2023-05-20 ENCOUNTER — Inpatient Hospital Stay: Payer: Medicare Other | Admitting: Hematology & Oncology

## 2023-05-20 VITALS — BP 123/47 | HR 55 | Temp 97.7°F | Resp 20 | Ht 68.0 in | Wt 181.0 lb

## 2023-05-20 DIAGNOSIS — C9000 Multiple myeloma not having achieved remission: Secondary | ICD-10-CM

## 2023-05-20 DIAGNOSIS — T50905A Adverse effect of unspecified drugs, medicaments and biological substances, initial encounter: Secondary | ICD-10-CM

## 2023-05-20 DIAGNOSIS — D500 Iron deficiency anemia secondary to blood loss (chronic): Secondary | ICD-10-CM

## 2023-05-20 DIAGNOSIS — C9001 Multiple myeloma in remission: Secondary | ICD-10-CM

## 2023-05-20 DIAGNOSIS — Z5112 Encounter for antineoplastic immunotherapy: Secondary | ICD-10-CM | POA: Diagnosis not present

## 2023-05-20 LAB — CBC WITH DIFFERENTIAL (CANCER CENTER ONLY)
Abs Immature Granulocytes: 0 10*3/uL (ref 0.00–0.07)
Basophils Absolute: 0 10*3/uL (ref 0.0–0.1)
Basophils Relative: 1 %
Eosinophils Absolute: 0.2 10*3/uL (ref 0.0–0.5)
Eosinophils Relative: 5 %
HCT: 30.6 % — ABNORMAL LOW (ref 39.0–52.0)
Hemoglobin: 10.5 g/dL — ABNORMAL LOW (ref 13.0–17.0)
Immature Granulocytes: 0 %
Lymphocytes Relative: 50 %
Lymphs Abs: 1.6 10*3/uL (ref 0.7–4.0)
MCH: 30.1 pg (ref 26.0–34.0)
MCHC: 34.3 g/dL (ref 30.0–36.0)
MCV: 87.7 fL (ref 80.0–100.0)
Monocytes Absolute: 0.4 10*3/uL (ref 0.1–1.0)
Monocytes Relative: 11 %
Neutro Abs: 1.1 10*3/uL — ABNORMAL LOW (ref 1.7–7.7)
Neutrophils Relative %: 33 %
Platelet Count: 89 10*3/uL — ABNORMAL LOW (ref 150–400)
RBC: 3.49 MIL/uL — ABNORMAL LOW (ref 4.22–5.81)
RDW: 13.3 % (ref 11.5–15.5)
WBC Count: 3.2 10*3/uL — ABNORMAL LOW (ref 4.0–10.5)
nRBC: 0 % (ref 0.0–0.2)

## 2023-05-20 LAB — PROTEIN ELECTROPHORESIS, SERUM, WITH REFLEX
A/G Ratio: 1 (ref 0.7–1.7)
Albumin ELP: 3.3 g/dL (ref 2.9–4.4)
Alpha-1-Globulin: 0.3 g/dL (ref 0.0–0.4)
Alpha-2-Globulin: 0.7 g/dL (ref 0.4–1.0)
Beta Globulin: 0.8 g/dL (ref 0.7–1.3)
Gamma Globulin: 1.6 g/dL (ref 0.4–1.8)
Globulin, Total: 3.4 g/dL (ref 2.2–3.9)
M-Spike, %: 1.3 g/dL — ABNORMAL HIGH
SPEP Interpretation: 0
Total Protein ELP: 6.7 g/dL (ref 6.0–8.5)

## 2023-05-20 LAB — CMP (CANCER CENTER ONLY)
ALT: 16 U/L (ref 0–44)
AST: 16 U/L (ref 15–41)
Albumin: 3.8 g/dL (ref 3.5–5.0)
Alkaline Phosphatase: 43 U/L (ref 38–126)
Anion gap: 10 (ref 5–15)
BUN: 22 mg/dL (ref 8–23)
CO2: 25 mmol/L (ref 22–32)
Calcium: 9.1 mg/dL (ref 8.9–10.3)
Chloride: 99 mmol/L (ref 98–111)
Creatinine: 1.05 mg/dL (ref 0.61–1.24)
GFR, Estimated: >60 mL/min (ref >60)
Glucose, Bld: 263 mg/dL — ABNORMAL HIGH (ref 70–99)
Potassium: 3.4 mmol/L — ABNORMAL LOW (ref 3.5–5.1)
Sodium: 134 mmol/L — ABNORMAL LOW (ref 135–145)
Total Bilirubin: 0.5 mg/dL (ref <1.2)
Total Protein: 7.1 g/dL (ref 6.5–8.1)

## 2023-05-20 LAB — IMMUNOFIXATION REFLEX, SERUM
IgA: 24 mg/dL — ABNORMAL LOW (ref 61–437)
IgG (Immunoglobin G), Serum: 2135 mg/dL — ABNORMAL HIGH (ref 603–1613)
IgM (Immunoglobulin M), Srm: 17 mg/dL (ref 15–143)

## 2023-05-20 LAB — IRON AND IRON BINDING CAPACITY (CC-WL,HP ONLY)
Iron: 112 ug/dL (ref 45–182)
Saturation Ratios: 26 % (ref 17.9–39.5)
TIBC: 430 ug/dL (ref 250–450)
UIBC: 318 ug/dL (ref 117–376)

## 2023-05-20 LAB — FERRITIN: Ferritin: 42 ng/mL (ref 24–336)

## 2023-05-20 LAB — LACTATE DEHYDROGENASE: LDH: 128 U/L (ref 98–192)

## 2023-05-20 MED ORDER — ACETAMINOPHEN 325 MG PO TABS
650.0000 mg | ORAL_TABLET | Freq: Once | ORAL | Status: AC
  Administered 2023-05-20: 650 mg via ORAL
  Filled 2023-05-20: qty 2

## 2023-05-20 MED ORDER — INSULIN ASPART 100 UNIT/ML IJ SOLN
10.0000 [IU] | Freq: Once | INTRAMUSCULAR | Status: AC
  Administered 2023-05-20: 10 [IU] via SUBCUTANEOUS
  Filled 2023-05-20: qty 0.1

## 2023-05-20 MED ORDER — DARATUMUMAB-HYALURONIDASE-FIHJ 1800-30000 MG-UT/15ML ~~LOC~~ SOLN
1800.0000 mg | Freq: Once | SUBCUTANEOUS | Status: AC
  Administered 2023-05-20: 1800 mg via SUBCUTANEOUS
  Filled 2023-05-20: qty 15

## 2023-05-20 MED ORDER — DIPHENHYDRAMINE HCL 25 MG PO CAPS
50.0000 mg | ORAL_CAPSULE | Freq: Once | ORAL | Status: AC
  Administered 2023-05-20: 50 mg via ORAL
  Filled 2023-05-20: qty 2

## 2023-05-20 NOTE — Patient Instructions (Signed)
Daratumumab; Hyaluronidase Injection What is this medication? DARATUMUMAB; HYALURONIDASE (dar a toom ue mab; hye al ur ON i dase) treats multiple myeloma, a type of bone marrow cancer. Daratumumab works by blocking a protein that causes cancer cells to grow and multiply. This helps to slow or stop the spread of cancer cells. Hyaluronidase works by increasing the absorption of other medications in the body to help them work better. This medication may also be used treat amyloidosis, a condition that causes the buildup of a protein (amyloid) in your body. It works by reducing the buildup of this protein, which decreases symptoms. It is a combination medication that contains a monoclonal antibody. This medicine may be used for other purposes; ask your health care provider or pharmacist if you have questions. COMMON BRAND NAME(S): DARZALEX FASPRO What should I tell my care team before I take this medication? They need to know if you have any of these conditions: Heart disease Infection, such as chickenpox, cold sores, herpes, hepatitis B Lung or breathing disease An unusual or allergic reaction to daratumumab, hyaluronidase, other medications, foods, dyes, or preservatives Pregnant or trying to get pregnant Breast-feeding How should I use this medication? This medication is injected under the skin. It is given by your care team in a hospital or clinic setting. Talk to your care team about the use of this medication in children. Special care may be needed. Overdosage: If you think you have taken too much of this medicine contact a poison control center or emergency room at once. NOTE: This medicine is only for you. Do not share this medicine with others. What if I miss a dose? Keep appointments for follow-up doses. It is important not to miss your dose. Call your care team if you are unable to keep an appointment. What may interact with this medication? Interactions have not been studied. This list  may not describe all possible interactions. Give your health care provider a list of all the medicines, herbs, non-prescription drugs, or dietary supplements you use. Also tell them if you smoke, drink alcohol, or use illegal drugs. Some items may interact with your medicine. What should I watch for while using this medication? Your condition will be monitored carefully while you are receiving this medication. This medication can cause serious allergic reactions. To reduce your risk, your care team may give you other medication to take before receiving this one. Be sure to follow the directions from your care team. This medication can affect the results of blood tests to match your blood type. These changes can last for up to 6 months after the final dose. Your care team will do blood tests to match your blood type before you start treatment. Tell all of your care team that you are being treated with this medication before receiving a blood transfusion. This medication can affect the results of some tests used to determine treatment response; extra tests may be needed to evaluate response. Talk to your care team if you wish to become pregnant or think you are pregnant. This medication can cause serious birth defects if taken during pregnancy and for 3 months after the last dose. A reliable form of contraception is recommended while taking this medication and for 3 months after the last dose. Talk to your care team about effective forms of contraception. Do not breast-feed while taking this medication. What side effects may I notice from receiving this medication? Side effects that you should report to your care team as soon as   possible: Allergic reactions--skin rash, itching, hives, swelling of the face, lips, tongue, or throat Heart rhythm changes--fast or irregular heartbeat, dizziness, feeling faint or lightheaded, chest pain, trouble breathing Infection--fever, chills, cough, sore throat, wounds that  don't heal, pain or trouble when passing urine, general feeling of discomfort or being unwell Infusion reactions--chest pain, shortness of breath or trouble breathing, feeling faint or lightheaded Sudden eye pain or change in vision such as blurry vision, seeing halos around lights, vision loss Unusual bruising or bleeding Side effects that usually do not require medical attention (report to your care team if they continue or are bothersome): Constipation Diarrhea Fatigue Nausea Pain, tingling, or numbness in the hands or feet Swelling of the ankles, hands, or feet This list may not describe all possible side effects. Call your doctor for medical advice about side effects. You may report side effects to FDA at 1-800-FDA-1088. Where should I keep my medication? This medication is given in a hospital or clinic. It will not be stored at home. NOTE: This sheet is a summary. It may not cover all possible information. If you have questions about this medicine, talk to your doctor, pharmacist, or health care provider.  2024 Elsevier/Gold Standard (2021-10-21 00:00:00)  

## 2023-05-20 NOTE — Progress Notes (Signed)
Hematology and Oncology Follow Up Visit  Arvill Libengood [id number] 22-Sep-1945 77 y.o. 05/20/2023   Principle Diagnosis:   IgG kappa multiple myeloma - FISH (-)/ Trisomy 11 Incidental exophytic lesion of R kidney Intermittent iron deficiency anemia   Current Therapy:   DVR -- s/p cycle #1 -- start on 03/29/2023 -will need to be taking 2 weeks on and 2 weeks off starting 05/06/2023 IV iron  - Ferahame given on 12/29/2021 325 mg EC ASA   Interim History:  Mr. Trone is here today for follow-up.  He is feeling better since we adjusted the dose of Revlimid.  He is on Revlimid 2 weeks on and 2 weeks off.  He feels okay.  He really has had no specific complaints.  There is no problems with rashes.  He has had no cough or shortness of breath.  He has had no diarrhea.  There has been no leg swelling.  He has had no bleeding.  My only concern is that his light chains are creeping up.  More last checked his Kappa light chain, it was up to 13.9 mg/dL.  This might be an indicator that he could be developing some resistance.  His blood sugars are still on the high side.  Today, his blood sugars 263.  We will give him 10 units of insulin.    Overall, his performance status is ECOG 1.     Wt Readings from Last 3 Encounters:  05/20/23 181 lb (82.1 kg)  05/13/23 179 lb 1.3 oz (81.2 kg)  05/06/23 180 lb (81.6 kg)    Medications:  Allergies as of 05/20/2023       Reactions   Clindamycin Hcl Itching   Famvir [famciclovir] Itching   Metoprolol Succinate Other (See Comments)   Pt does not recall.        Medication List        Accurate as of May 20, 2023  9:54 AM. If you have any questions, ask your nurse or doctor.          acyclovir 200 MG capsule Commonly known as: ZOVIRAX Take 1 capsule (200 mg total) by mouth 2 (two) times daily.   amLODipine 10 MG tablet Commonly known as: NORVASC Take 10 mg by mouth every morning.   aspirin EC 325 MG tablet Take 325 mg by mouth  daily.   ezetimibe 10 MG tablet Commonly known as: ZETIA Take 10 mg by mouth daily.   furosemide 20 MG tablet Commonly known as: LASIX Take 20 mg by mouth daily.   lenalidomide 15 MG capsule Commonly known as: REVLIMID Take 1 capsule (15 mg total) by mouth daily. Take for 21 days, then 7 days off. Repeat every 28 days. [employer] Auth # [id number]; Date Obtained 04/26/2023   metFORMIN 500 MG 24 hr tablet Commonly known as: GLUCOPHAGE-XR Take 4 tablets by mouth daily.   Mounjaro 2.5 MG/0.5ML Pen Generic drug: tirzepatide Inject 2.5 mg into the skin once a week.   omeprazole 20 MG capsule Commonly known as: PRILOSEC Take 20 mg by mouth 2 (two) times daily before a meal.   ondansetron 8 MG tablet Commonly known as: Zofran Take 1 tablet (8 mg total) by mouth every 8 (eight) hours as needed for nausea or vomiting.   prochlorperazine 10 MG tablet Commonly known as: COMPAZINE Take 1 tablet (10 mg total) by mouth every 6 (six) hours as needed for nausea or vomiting.   rosuvastatin 40 MG tablet Commonly known as: CRESTOR Take 40 mg by  mouth daily.   Testosterone 25 MG/2.5GM (1%) Gel Place 1 mg onto the skin every other day.   triamterene-hydrochlorothiazide 37.5-25 MG tablet Commonly known as: MAXZIDE-25 Take 1 tablet by mouth every morning.   valsartan 320 MG tablet Commonly known as: DIOVAN Take 320 mg by mouth daily.        Allergies:  Allergies  Allergen Reactions   Clindamycin Hcl Itching   Famvir [Famciclovir] Itching   Metoprolol Succinate Other (See Comments)    Pt does not recall.    Past Medical History, Surgical history, Social history, and Family History were reviewed and updated.  Review of Systems: Review of Systems  Constitutional: Negative.   HENT: Negative.    Eyes: Negative.   Respiratory: Negative.    Cardiovascular: Negative.   Gastrointestinal: Negative.   Genitourinary: Negative.   Musculoskeletal: Negative.   Skin: Negative.    Neurological: Negative.   Endo/Heme/Allergies: Negative.   Psychiatric/Behavioral: Negative.        Physical Exam:  height is 5\' 8"  (1.727 m) and weight is 181 lb (82.1 kg). His oral temperature is 97.7 F (36.5 C). His blood pressure is 123/47 (abnormal) and his pulse is 55 (abnormal). His respiration is 20 and oxygen saturation is 98%.    Wt Readings from Last 3 Encounters:  05/20/23 181 lb (82.1 kg)  05/13/23 179 lb 1.3 oz (81.2 kg)  05/06/23 180 lb (81.6 kg)    Physical Exam Vitals reviewed.  HENT:     Head: Normocephalic and atraumatic.  Eyes:     Pupils: Pupils are equal, round, and reactive to light.  Cardiovascular:     Rate and Rhythm: Normal rate and regular rhythm.     Heart sounds: Normal heart sounds.  Pulmonary:     Effort: Pulmonary effort is normal.     Breath sounds: Normal breath sounds.  Abdominal:     General: Bowel sounds are normal.     Palpations: Abdomen is soft.  Musculoskeletal:        General: No tenderness or deformity. Normal range of motion.     Cervical back: Normal range of motion.  Lymphadenopathy:     Cervical: No cervical adenopathy.  Skin:    General: Skin is warm and dry.     Findings: No erythema or rash.  Neurological:     Mental Status: He is alert and oriented to person, place, and time.  Psychiatric:        Behavior: Behavior normal.        Thought Content: Thought content normal.        Judgment: Judgment normal.     Lab Results  Component Value Date   WBC 3.2 (L) 05/20/2023   HGB 10.5 (L) 05/20/2023   HCT 30.6 (L) 05/20/2023   MCV 87.7 05/20/2023   PLT 89 (L) 05/20/2023   Lab Results  Component Value Date   FERRITIN 46 08/31/2022   IRON 86 08/31/2022   TIBC 395 08/31/2022   UIBC 309 08/31/2022   IRONPCTSAT 22 08/31/2022   Lab Results  Component Value Date   RETICCTPCT 2.3 08/31/2022   RBC 3.49 (L) 05/20/2023   Lab Results  Component Value Date   KPAFRELGTCHN 138.7 (H) 05/13/2023   LAMBDASER 10.5  05/13/2023   KAPLAMBRATIO 13.21 (H) 05/13/2023   Lab Results  Component Value Date   IGGSERUM 1,991 (H) 05/13/2023   IGA 24 (L) 05/13/2023   IGMSERUM 16 05/13/2023   Lab Results  Component Value Date   TOTALPROTELP  6.8 04/29/2023   ALBUMINELP 3.1 04/29/2023   A1GS 0.3 04/29/2023   A2GS 0.8 04/29/2023   BETS 0.8 04/29/2023   GAMS 1.8 04/29/2023   MSPIKE 1.6 (H) 04/29/2023   SPEI Comment 04/29/2023     Chemistry      Component Value Date/Time   NA 134 (L) 05/20/2023 0912   NA 139 06/20/2020 0923   K 3.4 (L) 05/20/2023 0912   CL 99 05/20/2023 0912   CO2 25 05/20/2023 0912   BUN 22 05/20/2023 0912   BUN 17 06/20/2020 0923   CREATININE 1.05 05/20/2023 0912   CREATININE 1.12 09/03/2015 1201      Component Value Date/Time   CALCIUM 9.1 05/20/2023 0912   ALKPHOS 43 05/20/2023 0912   AST 16 05/20/2023 0912   ALT 16 05/20/2023 0912   BILITOT 0.5 05/20/2023 0912       Impression and Plan: Mr. Higgs is a very pleasant 77 yo caucasian gentleman with IgG kappa multiple myeloma.  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  His FISH panel was negative. Currently he is on daratumumab/Velcade/Revlimid without Decadron.   Again, I will decrease his Revlimid to  2 weeks on and 2 weeks off.  I think this will help.    I would be quite surprised if he is developing some resistance.  He is on a fairly active regimen.  We will go ahead with his treatment today.  We will plan to get him back in a couple weeks for his third cycle of treatment.     Josph Macho, MD 11/21/20249:54 AM

## 2023-05-20 NOTE — Progress Notes (Signed)
Current labs reviewed with Dr. Myna Hidalgo , Verbal consent given to proceed with treatment today.

## 2023-05-21 LAB — KAPPA/LAMBDA LIGHT CHAINS
Kappa free light chain: 100.2 mg/L — ABNORMAL HIGH (ref 3.3–19.4)
Kappa, lambda light chain ratio: 9.92 — ABNORMAL HIGH (ref 0.26–1.65)
Lambda free light chains: 10.1 mg/L (ref 5.7–26.3)

## 2023-05-24 ENCOUNTER — Encounter: Payer: Self-pay | Admitting: *Deleted

## 2023-05-24 ENCOUNTER — Other Ambulatory Visit: Payer: Self-pay

## 2023-05-24 DIAGNOSIS — C9000 Multiple myeloma not having achieved remission: Secondary | ICD-10-CM

## 2023-05-24 MED ORDER — LENALIDOMIDE 15 MG PO CAPS: 15.0000 mg | ORAL_CAPSULE | Freq: Every day | ORAL | 0 refills | Status: DC

## 2023-05-26 ENCOUNTER — Inpatient Hospital Stay: Payer: Medicare Other

## 2023-05-26 ENCOUNTER — Inpatient Hospital Stay: Payer: Medicare Other | Admitting: Hematology & Oncology

## 2023-05-26 LAB — PROTEIN ELECTROPHORESIS, SERUM
A/G Ratio: 0.9 (ref 0.7–1.7)
Albumin ELP: 3.3 g/dL (ref 2.9–4.4)
Alpha-1-Globulin: 0.2 g/dL (ref 0.0–0.4)
Alpha-2-Globulin: 0.8 g/dL (ref 0.4–1.0)
Beta Globulin: 0.9 g/dL (ref 0.7–1.3)
Gamma Globulin: 1.7 g/dL (ref 0.4–1.8)
Globulin, Total: 3.5 g/dL (ref 2.2–3.9)
M-Spike, %: 1.3 g/dL — ABNORMAL HIGH
Total Protein ELP: 6.8 g/dL (ref 6.0–8.5)

## 2023-05-26 LAB — IMMUNOFIXATION ELECTROPHORESIS
IgA: 24 mg/dL — ABNORMAL LOW (ref 61–437)
IgG (Immunoglobin G), Serum: 1896 mg/dL — ABNORMAL HIGH (ref 603–1613)
IgM (Immunoglobulin M), Srm: 15 mg/dL (ref 15–143)
Total Protein ELP: 6.8 g/dL (ref 6.0–8.5)

## 2023-06-03 ENCOUNTER — Encounter: Payer: Self-pay | Admitting: Hematology & Oncology

## 2023-06-03 ENCOUNTER — Ambulatory Visit: Payer: Medicare Other | Attending: Internal Medicine | Admitting: Internal Medicine

## 2023-06-03 ENCOUNTER — Inpatient Hospital Stay: Payer: Medicare Other

## 2023-06-03 ENCOUNTER — Other Ambulatory Visit: Payer: Self-pay

## 2023-06-03 ENCOUNTER — Inpatient Hospital Stay: Payer: Medicare Other | Admitting: Hematology & Oncology

## 2023-06-03 ENCOUNTER — Inpatient Hospital Stay: Payer: Medicare Other | Attending: Hematology & Oncology

## 2023-06-03 VITALS — BP 118/57 | HR 72 | Temp 97.5°F | Resp 18 | Ht 69.0 in | Wt 180.0 lb

## 2023-06-03 DIAGNOSIS — D509 Iron deficiency anemia, unspecified: Secondary | ICD-10-CM | POA: Insufficient documentation

## 2023-06-03 DIAGNOSIS — Z79899 Other long term (current) drug therapy: Secondary | ICD-10-CM | POA: Insufficient documentation

## 2023-06-03 DIAGNOSIS — C9000 Multiple myeloma not having achieved remission: Secondary | ICD-10-CM | POA: Insufficient documentation

## 2023-06-03 DIAGNOSIS — C9001 Multiple myeloma in remission: Secondary | ICD-10-CM

## 2023-06-03 DIAGNOSIS — Z5112 Encounter for antineoplastic immunotherapy: Secondary | ICD-10-CM | POA: Insufficient documentation

## 2023-06-03 DIAGNOSIS — R739 Hyperglycemia, unspecified: Secondary | ICD-10-CM

## 2023-06-03 DIAGNOSIS — T50905A Adverse effect of unspecified drugs, medicaments and biological substances, initial encounter: Secondary | ICD-10-CM

## 2023-06-03 LAB — CBC WITH DIFFERENTIAL (CANCER CENTER ONLY)
Abs Immature Granulocytes: 0 10*3/uL (ref 0.00–0.07)
Basophils Absolute: 0 10*3/uL (ref 0.0–0.1)
Basophils Relative: 0 %
Eosinophils Absolute: 0.3 10*3/uL (ref 0.0–0.5)
Eosinophils Relative: 7 %
HCT: 32.3 % — ABNORMAL LOW (ref 39.0–52.0)
Hemoglobin: 10.8 g/dL — ABNORMAL LOW (ref 13.0–17.0)
Immature Granulocytes: 0 %
Lymphocytes Relative: 35 %
Lymphs Abs: 1.4 10*3/uL (ref 0.7–4.0)
MCH: 29.9 pg (ref 26.0–34.0)
MCHC: 33.4 g/dL (ref 30.0–36.0)
MCV: 89.5 fL (ref 80.0–100.0)
Monocytes Absolute: 0.3 10*3/uL (ref 0.1–1.0)
Monocytes Relative: 6 %
Neutro Abs: 2.1 10*3/uL (ref 1.7–7.7)
Neutrophils Relative %: 52 %
Platelet Count: 165 10*3/uL (ref 150–400)
RBC: 3.61 MIL/uL — ABNORMAL LOW (ref 4.22–5.81)
RDW: 14.5 % (ref 11.5–15.5)
WBC Count: 4 10*3/uL (ref 4.0–10.5)
nRBC: 0 % (ref 0.0–0.2)

## 2023-06-03 LAB — CMP (CANCER CENTER ONLY)
ALT: 12 U/L (ref 0–44)
AST: 17 U/L (ref 15–41)
Albumin: 3.9 g/dL (ref 3.5–5.0)
Alkaline Phosphatase: 43 U/L (ref 38–126)
Anion gap: 11 (ref 5–15)
BUN: 18 mg/dL (ref 8–23)
CO2: 27 mmol/L (ref 22–32)
Calcium: 9.4 mg/dL (ref 8.9–10.3)
Chloride: 101 mmol/L (ref 98–111)
Creatinine: 1.05 mg/dL (ref 0.61–1.24)
GFR, Estimated: >60 mL/min (ref >60)
Glucose, Bld: 269 mg/dL — ABNORMAL HIGH (ref 70–99)
Potassium: 3.6 mmol/L (ref 3.5–5.1)
Sodium: 139 mmol/L (ref 135–145)
Total Bilirubin: 0.5 mg/dL (ref <1.2)
Total Protein: 7.6 g/dL (ref 6.5–8.1)

## 2023-06-03 LAB — LACTATE DEHYDROGENASE: LDH: 148 U/L (ref 98–192)

## 2023-06-03 LAB — SAVE SMEAR(SSMR), FOR PROVIDER SLIDE REVIEW

## 2023-06-03 MED ORDER — DIPHENHYDRAMINE HCL 25 MG PO CAPS
50.0000 mg | ORAL_CAPSULE | Freq: Once | ORAL | Status: AC
  Administered 2023-06-03: 50 mg via ORAL
  Filled 2023-06-03: qty 2

## 2023-06-03 MED ORDER — DARATUMUMAB-HYALURONIDASE-FIHJ 1800-30000 MG-UT/15ML ~~LOC~~ SOLN
1800.0000 mg | Freq: Once | SUBCUTANEOUS | Status: AC
  Administered 2023-06-03: 1800 mg via SUBCUTANEOUS
  Filled 2023-06-03: qty 15

## 2023-06-03 MED ORDER — BORTEZOMIB CHEMO SQ INJECTION 3.5 MG (2.5MG/ML)
1.3000 mg/m2 | Freq: Once | INTRAMUSCULAR | Status: AC
  Administered 2023-06-03: 2.5 mg via SUBCUTANEOUS
  Filled 2023-06-03: qty 1

## 2023-06-03 MED ORDER — ACETAMINOPHEN 325 MG PO TABS
650.0000 mg | ORAL_TABLET | Freq: Once | ORAL | Status: AC
Start: 2023-06-03 — End: 2023-06-03
  Administered 2023-06-03: 650 mg via ORAL
  Filled 2023-06-03: qty 2

## 2023-06-03 MED ORDER — INSULIN ASPART 100 UNIT/ML IJ SOLN
12.0000 [IU] | Freq: Once | INTRAMUSCULAR | Status: AC
Start: 2023-06-03 — End: 2023-06-03
  Administered 2023-06-03: 12 [IU] via SUBCUTANEOUS
  Filled 2023-06-03: qty 0.12

## 2023-06-03 NOTE — Addendum Note (Signed)
Addended by: Anselm Jungling on: 06/03/2023 11:32 AM   Modules accepted: Orders

## 2023-06-03 NOTE — Progress Notes (Signed)
Hematology and Oncology Follow Up Visit  [name] [id number] April 08, 1946 77 y.o. 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 is here today for follow-up.  He is feeling better since we adjusted the dose of Revlimid.  He is on Revlimid 2 weeks on and 2 weeks off.  His last monoclonal studies showed that his M spike was 1.3 g/dL.  His IgG level was 1900 mg/dL.  The Kappa light chain was 10.0 mg/dL.  He still has problems his blood sugars.  I think this is can be his biggest long-term issue.  We will have to give him some insulin today.  I think he sees his family doctor/endocrinologist in a week or so.  He has had no problems with nausea or vomiting.  There is been no change in bowel or bladder habits.  He has had no rashes.  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  He has had no bleeding.  Overall, I would say his performance status is probably ECOG 1.   Wt Readings from Last 3 Encounters:  06/03/23 180 lb (81.6 kg)  05/20/23 181 lb (82.1 kg)  05/13/23 179 lb 1.3 oz (81.2 kg)    Medications:  Allergies as of 06/03/2023       Reactions   Clindamycin Hcl Itching   Famvir [famciclovir] Itching   Metoprolol Succinate Other (See Comments)   Pt does not recall.        Medication List        Accurate as of June 03, 2023 10:05 AM. If you have any questions, ask your nurse or doctor.          acyclovir 200 MG capsule Commonly known as: ZOVIRAX Take 1 capsule (200 mg total) by mouth 2 (two) times daily.   amLODipine 10 MG tablet Commonly known as: NORVASC Take 10 mg by mouth every morning.   aspirin EC 325 MG tablet Take  325 mg by mouth daily.   ezetimibe 10 MG tablet Commonly known as: ZETIA Take 10 mg by mouth daily.   furosemide 20 MG tablet Commonly known as: LASIX Take 20 mg by mouth daily.   lenalidomide 15 MG capsule Commonly known as: REVLIMID Take 1 capsule (15 mg total) by mouth daily. Take for 21 days, then 7 days off. Repeat every 28 days. [employer] Auth # [id number]; Date Obtained 05/24/2023   metFORMIN 500 MG 24 hr tablet Commonly known as: GLUCOPHAGE-XR Take 4 tablets by mouth daily.   Mounjaro 5 MG/0.5ML Pen Generic drug: tirzepatide Inject 5 mg into the skin once a week. What changed: Another medication with the same name was removed. Continue taking this medication, and follow the directions you see here. Changed by: Josph Macho   omeprazole 20 MG capsule Commonly known as: PRILOSEC Take 20 mg by mouth 2 (two) times daily before a meal.   ondansetron 8 MG tablet Commonly known as: Zofran Take 1 tablet (8 mg total) by mouth every 8 (eight) hours as needed for nausea or vomiting.   prochlorperazine 10 MG tablet Commonly known as: COMPAZINE Take 1 tablet (10 mg total) by  mouth every 6 (six) hours as needed for nausea or vomiting.   rosuvastatin 40 MG tablet Commonly known as: CRESTOR Take 40 mg by mouth daily.   Testosterone 25 MG/2.5GM (1%) Gel Place 1 mg onto the skin every other day.   triamterene-hydrochlorothiazide 37.5-25 MG tablet Commonly known as: MAXZIDE-25 Take 1 tablet by mouth every morning.   valsartan 320 MG tablet Commonly known as: DIOVAN Take 320 mg by mouth daily.        Allergies:  Allergies  Allergen Reactions   Clindamycin Hcl Itching   Famvir [Famciclovir] Itching   Metoprolol Succinate Other (See Comments)    Pt does not recall.    Past Medical History, Surgical history, Social history, and Family History were reviewed and updated.  Review of Systems: Review of Systems  Constitutional: Negative.   HENT: Negative.    Eyes:  Negative.   Respiratory: Negative.    Cardiovascular: Negative.   Gastrointestinal: Negative.   Genitourinary: Negative.   Musculoskeletal: Negative.   Skin: Negative.   Neurological: Negative.   Endo/Heme/Allergies: Negative.   Psychiatric/Behavioral: Negative.        Physical Exam:  height is 5\' 9"  (1.753 m) and weight is 180 lb (81.6 kg). His oral temperature is 97.5 F (36.4 C) (abnormal). His blood pressure is 118/57 (abnormal) and his pulse is 72. His respiration is 18 and oxygen saturation is 99%.    Wt Readings from Last 3 Encounters:  06/03/23 180 lb (81.6 kg)  05/20/23 181 lb (82.1 kg)  05/13/23 179 lb 1.3 oz (81.2 kg)    Physical Exam Vitals reviewed.  HENT:     Head: Normocephalic and atraumatic.  Eyes:     Pupils: Pupils are equal, round, and reactive to light.  Cardiovascular:     Rate and Rhythm: Normal rate and regular rhythm.     Heart sounds: Normal heart sounds.  Pulmonary:     Effort: Pulmonary effort is normal.     Breath sounds: Normal breath sounds.  Abdominal:     General: Bowel sounds are normal.     Palpations: Abdomen is soft.  Musculoskeletal:        General: No tenderness or deformity. Normal range of motion.     Cervical back: Normal range of motion.  Lymphadenopathy:     Cervical: No cervical adenopathy.  Skin:    General: Skin is warm and dry.     Findings: No erythema or rash.  Neurological:     Mental Status: He is alert and oriented to person, place, and time.  Psychiatric:        Behavior: Behavior normal.        Thought Content: Thought content normal.        Judgment: Judgment normal.      Lab Results  Component Value Date   WBC 4.0 06/03/2023   HGB 10.8 (L) 06/03/2023   HCT 32.3 (L) 06/03/2023   MCV 89.5 06/03/2023   PLT 165 06/03/2023   Lab Results  Component Value Date   FERRITIN 42 05/20/2023   IRON 112 05/20/2023   TIBC 430 05/20/2023   UIBC 318 05/20/2023   IRONPCTSAT 26 05/20/2023   Lab Results   Component Value Date   RETICCTPCT 2.3 08/31/2022   RBC 3.61 (L) 06/03/2023   Lab Results  Component Value Date   KPAFRELGTCHN 100.2 (H) 05/20/2023   LAMBDASER 10.1 05/20/2023   KAPLAMBRATIO 9.92 (H) 05/20/2023   Lab Results  Component Value Date   IGGSERUM 1,896 (  H) 05/20/2023   IGA 24 (L) 05/20/2023   IGMSERUM 15 05/20/2023   Lab Results  Component Value Date   TOTALPROTELP 6.8 05/20/2023   TOTALPROTELP 6.8 05/20/2023   ALBUMINELP 3.3 05/20/2023   A1GS 0.2 05/20/2023   A2GS 0.8 05/20/2023   BETS 0.9 05/20/2023   GAMS 1.7 05/20/2023   MSPIKE 1.3 (H) 05/20/2023   SPEI Comment 05/20/2023     Chemistry      Component Value Date/Time   NA 139 06/03/2023 0902   NA 139 06/20/2020 0923   K 3.6 06/03/2023 0902   CL 101 06/03/2023 0902   CO2 27 06/03/2023 0902   BUN 18 06/03/2023 0902   BUN 17 06/20/2020 0923   CREATININE 1.05 06/03/2023 0902   CREATININE 1.12 09/03/2015 1201      Component Value Date/Time   CALCIUM 9.4 06/03/2023 0902   ALKPHOS 43 06/03/2023 0902   AST 17 06/03/2023 0902   ALT 12 06/03/2023 0902   BILITOT 0.5 06/03/2023 0902       Impression and Plan: Mr. Regen is a very pleasant 77 yo caucasian gentleman with IgG kappa multiple myeloma.  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  His FISH panel was negative. Currently he is on daratumumab/Velcade/Revlimid without Decadron.   We will continue to follow his monoclonal studies.  I do think we have a little bit of flexibility with how we treat him over the holiday season.  I think we can probably get his 4th cycle started after New Year's.  I think this would help him out.  I think he would be okay with this.  We will give him some insulin today.  I will give him 12 units.  Hopefully, his blood sugars will be under better control.  I will plan to see him back on January 9.     Josph Macho, MD 12/5/202410:05 AM

## 2023-06-03 NOTE — Patient Instructions (Signed)
[facility] - A DEPT OF [facility]  Discharge Instructions: Thank you for choosing [facility] to provide your oncology and hematology care.   If you have a lab appointment with the Cancer Center, please go directly to the Cancer Center and check in at the registration area.  Wear comfortable clothing and clothing appropriate for easy access to any Portacath or PICC line.   We strive to give you quality time with your provider. You may need to reschedule your appointment if you arrive late (15 or more minutes).  Arriving late affects you and other patients whose appointments are after yours.  Also, if you miss three or more appointments without notifying the office, you may be dismissed from the clinic at the provider's discretion.      For prescription refill requests, have your pharmacy contact our office and allow 72 hours for refills to be completed.    Today you received the following chemotherapy and/or immunotherapy agents Velcade, Darzalex      To help prevent nausea and vomiting after your treatment, we encourage you to take your nausea medication as directed.  BELOW ARE SYMPTOMS THAT SHOULD BE REPORTED IMMEDIATELY: *FEVER GREATER THAN 100.4 F (38 C) OR HIGHER *CHILLS OR SWEATING *NAUSEA AND VOMITING THAT IS NOT CONTROLLED WITH YOUR NAUSEA MEDICATION *UNUSUAL SHORTNESS OF BREATH *UNUSUAL BRUISING OR BLEEDING *URINARY PROBLEMS (pain or burning when urinating, or frequent urination) *BOWEL PROBLEMS (unusual diarrhea, constipation, pain near the anus) TENDERNESS IN MOUTH AND THROAT WITH OR WITHOUT PRESENCE OF ULCERS (sore throat, sores in mouth, or a toothache) UNUSUAL RASH, SWELLING OR PAIN  UNUSUAL VAGINAL DISCHARGE OR ITCHING   Items with * indicate a potential emergency and should be followed up as soon as possible or go to the Emergency Department if any problems should occur.  Please show the CHEMOTHERAPY ALERT CARD or  IMMUNOTHERAPY ALERT CARD at check-in to the Emergency Department and triage nurse. Should you have questions after your visit or need to cancel or reschedule your appointment, please contact [facility] - A DEPT OF [facility]  [phone] and follow the prompts.  Office hours are 8:00 a.m. to 4:30 p.m. Monday - Friday. Please note that voicemails left after 4:00 p.m. may not be returned until the following business day.  We are closed weekends and major holidays. You have access to a nurse at all times for urgent questions. Please call the main number to the clinic [phone] and follow the prompts.  For any non-urgent questions, you may also contact your provider using MyChart. We now offer e-Visits for anyone 61 and older to request care online for non-urgent symptoms. For details visit [email or web address].   Also download the MyChart app! Go to the app store, search "MyChart", open the app, select [facility], and log in with your MyChart username and password.

## 2023-06-04 ENCOUNTER — Encounter: Payer: Self-pay | Admitting: Internal Medicine

## 2023-06-04 LAB — IGG, IGA, IGM
IgA: 21 mg/dL — ABNORMAL LOW (ref 61–437)
IgG (Immunoglobin G), Serum: 2013 mg/dL — ABNORMAL HIGH (ref 603–1613)
IgM (Immunoglobulin M), Srm: 15 mg/dL (ref 15–143)

## 2023-06-04 LAB — KAPPA/LAMBDA LIGHT CHAINS
Kappa free light chain: 126.9 mg/L — ABNORMAL HIGH (ref 3.3–19.4)
Kappa, lambda light chain ratio: 18.39 — ABNORMAL HIGH (ref 0.26–1.65)
Lambda free light chains: 6.9 mg/L (ref 5.7–26.3)

## 2023-06-10 ENCOUNTER — Inpatient Hospital Stay: Payer: Medicare Other

## 2023-06-10 VITALS — BP 132/61 | HR 68 | Temp 98.0°F | Resp 17

## 2023-06-10 DIAGNOSIS — C9000 Multiple myeloma not having achieved remission: Secondary | ICD-10-CM

## 2023-06-10 DIAGNOSIS — Z5112 Encounter for antineoplastic immunotherapy: Secondary | ICD-10-CM | POA: Diagnosis not present

## 2023-06-10 LAB — PROTEIN ELECTROPHORESIS, SERUM, WITH REFLEX
A/G Ratio: 1 (ref 0.7–1.7)
Albumin ELP: 3.7 g/dL (ref 2.9–4.4)
Alpha-1-Globulin: 0.3 g/dL (ref 0.0–0.4)
Alpha-2-Globulin: 0.8 g/dL (ref 0.4–1.0)
Beta Globulin: 0.9 g/dL (ref 0.7–1.3)
Gamma Globulin: 1.7 g/dL (ref 0.4–1.8)
Globulin, Total: 3.6 g/dL (ref 2.2–3.9)
M-Spike, %: 1.3 g/dL — ABNORMAL HIGH
SPEP Interpretation: 0
Total Protein ELP: 7.3 g/dL (ref 6.0–8.5)

## 2023-06-10 LAB — CBC WITH DIFFERENTIAL (CANCER CENTER ONLY)
Abs Immature Granulocytes: 0.01 10*3/uL (ref 0.00–0.07)
Basophils Absolute: 0 10*3/uL (ref 0.0–0.1)
Basophils Relative: 1 %
Eosinophils Absolute: 0.1 10*3/uL (ref 0.0–0.5)
Eosinophils Relative: 3 %
HCT: 31.8 % — ABNORMAL LOW (ref 39.0–52.0)
Hemoglobin: 10.5 g/dL — ABNORMAL LOW (ref 13.0–17.0)
Immature Granulocytes: 0 %
Lymphocytes Relative: 29 %
Lymphs Abs: 1.3 10*3/uL (ref 0.7–4.0)
MCH: 29.8 pg (ref 26.0–34.0)
MCHC: 33 g/dL (ref 30.0–36.0)
MCV: 90.3 fL (ref 80.0–100.0)
Monocytes Absolute: 0.5 10*3/uL (ref 0.1–1.0)
Monocytes Relative: 11 %
Neutro Abs: 2.4 10*3/uL (ref 1.7–7.7)
Neutrophils Relative %: 56 %
Platelet Count: 116 10*3/uL — ABNORMAL LOW (ref 150–400)
RBC: 3.52 MIL/uL — ABNORMAL LOW (ref 4.22–5.81)
RDW: 14.5 % (ref 11.5–15.5)
WBC Count: 4.3 10*3/uL (ref 4.0–10.5)
nRBC: 0 % (ref 0.0–0.2)

## 2023-06-10 LAB — CMP (CANCER CENTER ONLY)
ALT: 12 U/L (ref 0–44)
AST: 12 U/L — ABNORMAL LOW (ref 15–41)
Albumin: 3.8 g/dL (ref 3.5–5.0)
Alkaline Phosphatase: 44 U/L (ref 38–126)
Anion gap: 7 (ref 5–15)
BUN: 15 mg/dL (ref 8–23)
CO2: 29 mmol/L (ref 22–32)
Calcium: 9.2 mg/dL (ref 8.9–10.3)
Chloride: 101 mmol/L (ref 98–111)
Creatinine: 1.04 mg/dL (ref 0.61–1.24)
GFR, Estimated: >60 mL/min
Glucose, Bld: 259 mg/dL — ABNORMAL HIGH (ref 70–99)
Potassium: 4 mmol/L (ref 3.5–5.1)
Sodium: 137 mmol/L (ref 135–145)
Total Bilirubin: 0.4 mg/dL
Total Protein: 7.1 g/dL (ref 6.5–8.1)

## 2023-06-10 LAB — IMMUNOFIXATION REFLEX, SERUM
IgA: 25 mg/dL — ABNORMAL LOW (ref 61–437)
IgG (Immunoglobin G), Serum: 2443 mg/dL — ABNORMAL HIGH (ref 603–1613)
IgM (Immunoglobulin M), Srm: 18 mg/dL (ref 15–143)

## 2023-06-10 MED ORDER — ACETAMINOPHEN 325 MG PO TABS: 650.0000 mg | ORAL_TABLET | Freq: Once | ORAL | Status: DC

## 2023-06-10 MED ORDER — BORTEZOMIB CHEMO SQ INJECTION 3.5 MG (2.5MG/ML)
1.3000 mg/m2 | Freq: Once | INTRAMUSCULAR | Status: AC
  Administered 2023-06-10: 2.5 mg via SUBCUTANEOUS
  Filled 2023-06-10: qty 1

## 2023-06-10 MED ORDER — DARATUMUMAB-HYALURONIDASE-FIHJ 1800-30000 MG-UT/15ML ~~LOC~~ SOLN
1800.0000 mg | Freq: Once | SUBCUTANEOUS | Status: AC
  Administered 2023-06-10: 1800 mg via SUBCUTANEOUS
  Filled 2023-06-10: qty 15

## 2023-06-10 MED ORDER — DIPHENHYDRAMINE HCL 25 MG PO CAPS: 50.0000 mg | ORAL_CAPSULE | Freq: Once | ORAL | Status: DC

## 2023-06-10 NOTE — Patient Instructions (Signed)
 [facility]  Discharge Instructions: Thank you for choosing [facility] to provide your oncology and hematology care.   If you have a lab appointment with the Cancer Center, please go directly to the Cancer Center and check in at the registration area.  Wear comfortable clothing and clothing appropriate for easy access to any Portacath or PICC line.   We strive to give you quality time with your provider. You may need to reschedule your appointment if you arrive late (15 or more minutes).  Arriving late affects you and other patients whose appointments are after yours.  Also, if you miss three or more appointments without notifying the office, you may be dismissed from the clinic at the provider's discretion.      For prescription refill requests, have your pharmacy contact our office and allow 72 hours for refills to be completed.    Today you received the following chemotherapy and/or immunotherapy agents Velcade, Darzalex      To help prevent nausea and vomiting after your treatment, we encourage you to take your nausea medication as directed.  BELOW ARE SYMPTOMS THAT SHOULD BE REPORTED IMMEDIATELY: *FEVER GREATER THAN 100.4 F (38 C) OR HIGHER *CHILLS OR SWEATING *NAUSEA AND VOMITING THAT IS NOT CONTROLLED WITH YOUR NAUSEA MEDICATION *UNUSUAL SHORTNESS OF BREATH *UNUSUAL BRUISING OR BLEEDING *URINARY PROBLEMS (pain or burning when urinating, or frequent urination) *BOWEL PROBLEMS (unusual diarrhea, constipation, pain near the anus) TENDERNESS IN MOUTH AND THROAT WITH OR WITHOUT PRESENCE OF ULCERS (sore throat, sores in mouth, or a toothache) UNUSUAL RASH, SWELLING OR PAIN  UNUSUAL VAGINAL DISCHARGE OR ITCHING   Items with * indicate a potential emergency and should be followed up as soon as possible or go to the Emergency Department if any problems should occur.  Please show the CHEMOTHERAPY ALERT CARD or  IMMUNOTHERAPY ALERT CARD at check-in to the Emergency Department and triage nurse. Should you have questions after your visit or need to cancel or reschedule your appointment, please contact [facility] - A DEPT OF [facility]  [phone] and follow the prompts.  Office hours are 8:00 a.m. to 4:30 p.m. Monday - Friday. Please note that voicemails left after 4:00 p.m. may not be returned until the following business day.  We are closed weekends and major holidays. You have access to a nurse at all times for urgent questions. Please call the main number to the clinic [phone] and follow the prompts.  For any non-urgent questions, you may also contact your provider using MyChart. We now offer e-Visits for anyone 61 and older to request care online for non-urgent symptoms. For details visit [email or web address].   Also download the MyChart app! Go to the app store, search "MyChart", open the app, select [place], and log in with your MyChart username and password.

## 2023-06-17 ENCOUNTER — Encounter: Payer: Self-pay | Admitting: Hematology & Oncology

## 2023-06-17 ENCOUNTER — Inpatient Hospital Stay: Payer: Medicare Other

## 2023-06-17 ENCOUNTER — Inpatient Hospital Stay: Payer: Medicare Other | Admitting: Hematology & Oncology

## 2023-06-17 VITALS — BP 133/59 | HR 64 | Temp 98.5°F | Resp 20 | Ht 69.0 in | Wt 182.8 lb

## 2023-06-17 DIAGNOSIS — C9000 Multiple myeloma not having achieved remission: Secondary | ICD-10-CM | POA: Diagnosis not present

## 2023-06-17 DIAGNOSIS — Z5112 Encounter for antineoplastic immunotherapy: Secondary | ICD-10-CM | POA: Diagnosis not present

## 2023-06-17 DIAGNOSIS — E1110 Type 2 diabetes mellitus with ketoacidosis without coma: Secondary | ICD-10-CM

## 2023-06-17 DIAGNOSIS — R739 Hyperglycemia, unspecified: Secondary | ICD-10-CM

## 2023-06-17 LAB — CBC WITH DIFFERENTIAL (CANCER CENTER ONLY)
Abs Immature Granulocytes: 0.02 10*3/uL (ref 0.00–0.07)
Basophils Absolute: 0 10*3/uL (ref 0.0–0.1)
Basophils Relative: 1 %
Eosinophils Absolute: 0.2 10*3/uL (ref 0.0–0.5)
Eosinophils Relative: 5 %
HCT: 30.5 % — ABNORMAL LOW (ref 39.0–52.0)
Hemoglobin: 10.1 g/dL — ABNORMAL LOW (ref 13.0–17.0)
Immature Granulocytes: 1 %
Lymphocytes Relative: 37 %
Lymphs Abs: 1.1 10*3/uL (ref 0.7–4.0)
MCH: 29.9 pg (ref 26.0–34.0)
MCHC: 33.1 g/dL (ref 30.0–36.0)
MCV: 90.2 fL (ref 80.0–100.0)
Monocytes Absolute: 0.6 10*3/uL (ref 0.1–1.0)
Monocytes Relative: 18 %
Neutro Abs: 1.2 10*3/uL — ABNORMAL LOW (ref 1.7–7.7)
Neutrophils Relative %: 38 %
Platelet Count: 97 10*3/uL — ABNORMAL LOW (ref 150–400)
RBC: 3.38 MIL/uL — ABNORMAL LOW (ref 4.22–5.81)
RDW: 14.1 % (ref 11.5–15.5)
WBC Count: 3 10*3/uL — ABNORMAL LOW (ref 4.0–10.5)
nRBC: 0 % (ref 0.0–0.2)

## 2023-06-17 LAB — CMP (CANCER CENTER ONLY)
ALT: 14 U/L (ref 0–44)
AST: 14 U/L — ABNORMAL LOW (ref 15–41)
Albumin: 3.8 g/dL (ref 3.5–5.0)
Alkaline Phosphatase: 43 U/L (ref 38–126)
Anion gap: 8 (ref 5–15)
BUN: 22 mg/dL (ref 8–23)
CO2: 26 mmol/L (ref 22–32)
Calcium: 9.1 mg/dL (ref 8.9–10.3)
Chloride: 102 mmol/L (ref 98–111)
Creatinine: 1.24 mg/dL (ref 0.61–1.24)
GFR, Estimated: 59 mL/min — ABNORMAL LOW (ref >60)
Glucose, Bld: 289 mg/dL — ABNORMAL HIGH (ref 70–99)
Potassium: 4.1 mmol/L (ref 3.5–5.1)
Sodium: 136 mmol/L (ref 135–145)
Total Bilirubin: 0.4 mg/dL (ref <1.2)
Total Protein: 6.9 g/dL (ref 6.5–8.1)

## 2023-06-17 LAB — LACTATE DEHYDROGENASE: LDH: 122 U/L (ref 98–192)

## 2023-06-17 MED ORDER — INSULIN ASPART 100 UNIT/ML IJ SOLN
15.0000 [IU] | Freq: Once | INTRAMUSCULAR | Status: AC
  Administered 2023-06-17: 15 [IU] via SUBCUTANEOUS
  Filled 2023-06-17: qty 0.15

## 2023-06-17 MED ORDER — ACETAMINOPHEN 325 MG PO TABS: 650.0000 mg | ORAL_TABLET | Freq: Once | ORAL | Status: DC

## 2023-06-17 MED ORDER — DIPHENHYDRAMINE HCL 25 MG PO CAPS
50.0000 mg | ORAL_CAPSULE | Freq: Once | ORAL | Status: DC
Start: 2023-06-17 — End: 2023-06-17

## 2023-06-17 MED ORDER — DARATUMUMAB-HYALURONIDASE-FIHJ 1800-30000 MG-UT/15ML ~~LOC~~ SOLN
1800.0000 mg | Freq: Once | SUBCUTANEOUS | Status: AC
  Administered 2023-06-17: 1800 mg via SUBCUTANEOUS
  Filled 2023-06-17: qty 15

## 2023-06-17 NOTE — Progress Notes (Signed)
Hematology and Oncology Follow Up Visit  Valdemar Coman [id number] 1946-04-17 77 y.o. 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 Neuhoff is here today for follow-up.  Unfortunately, his blood sugars still are big problem.  We will have to give him some insulin.  I really think that insulin is going to help him.  Otherwise, he seems to be doing pretty well.  His last monoclonal studies are holding pretty steady.  His last functional spike was 1.3 g/dL.  The IgG level is up a little bit that I think 2200 mg/dL.  The Kappa light chain was also up a little bit at 12.7 mg/dL.  He has had no problems with pain.  He said no problems with numbness or tingling.  There has been no change in bowel or bladder habits.  He has had no cough or shortness of breath.  He has had no headache.  He has had no mouth sores.  Overall, I would say his performance status is probably ECOG 1.     Wt Readings from Last 3 Encounters:  06/17/23 182 lb 12.8 oz (82.9 kg)  06/03/23 180 lb (81.6 kg)  05/20/23 181 lb (82.1 kg)    Medications:  Allergies as of 06/17/2023       Reactions   Clindamycin Hcl Itching   Famvir [famciclovir] Itching   Metoprolol Succinate Other (See Comments)   Pt does not recall.        Medication List        Accurate as of June 17, 2023  9:51 AM. If you have any questions, ask your nurse or doctor.          acyclovir 200 MG capsule Commonly known as: ZOVIRAX Take 1 capsule (200 mg total) by mouth 2 (two) times daily.   amLODipine 10 MG tablet Commonly known as: NORVASC Take 10 mg by mouth every morning.   aspirin EC 325 MG tablet Take  325 mg by mouth daily.   ezetimibe 10 MG tablet Commonly known as: ZETIA Take 10 mg by mouth daily.   furosemide 20 MG tablet Commonly known as: LASIX Take 20 mg by mouth daily.   lenalidomide 15 MG capsule Commonly known as: REVLIMID Take 1 capsule (15 mg total) by mouth daily. Take for 21 days, then 7 days off. Repeat every 28 days. [employer] Auth # [id number]; Date Obtained 05/24/2023   metFORMIN 500 MG 24 hr tablet Commonly known as: GLUCOPHAGE-XR Take 4 tablets by mouth daily.   Mounjaro 5 MG/0.5ML Pen Generic drug: tirzepatide Inject 2.5 mg into the skin once a week.   omeprazole 20 MG capsule Commonly known as: PRILOSEC Take 20 mg by mouth 2 (two) times daily before a meal.   ondansetron 8 MG tablet Commonly known as: Zofran Take 1 tablet (8 mg total) by mouth every 8 (eight) hours as needed for nausea or vomiting.   prochlorperazine 10 MG tablet Commonly known as: COMPAZINE Take 1 tablet (10 mg total) by mouth every 6 (six) hours as needed for nausea or vomiting.   rosuvastatin 40 MG tablet Commonly known as: CRESTOR Take 40 mg by  mouth daily.   Testosterone 25 MG/2.5GM (1%) Gel Place 1 mg onto the skin every other day.   triamterene-hydrochlorothiazide 37.5-25 MG tablet Commonly known as: MAXZIDE-25 Take 1 tablet by mouth every morning.   valsartan 320 MG tablet Commonly known as: DIOVAN Take 320 mg by mouth daily.        Allergies:  Allergies  Allergen Reactions   Clindamycin Hcl Itching   Famvir [Famciclovir] Itching   Metoprolol Succinate Other (See Comments)    Pt does not recall.    Past Medical History, Surgical history, Social history, and Family History were reviewed and updated.  Review of Systems: Review of Systems  Constitutional: Negative.   HENT: Negative.    Eyes: Negative.   Respiratory: Negative.    Cardiovascular: Negative.   Gastrointestinal: Negative.   Genitourinary: Negative.   Musculoskeletal: Negative.   Skin:  Negative.   Neurological: Negative.   Endo/Heme/Allergies: Negative.   Psychiatric/Behavioral: Negative.        Physical Exam:  height is 5\' 9"  (1.753 m) and weight is 182 lb 12.8 oz (82.9 kg). His oral temperature is 98.5 F (36.9 C). His blood pressure is 133/59 (abnormal) and his pulse is 64. His respiration is 20 and oxygen saturation is 98%.    Wt Readings from Last 3 Encounters:  06/17/23 182 lb 12.8 oz (82.9 kg)  06/03/23 180 lb (81.6 kg)  05/20/23 181 lb (82.1 kg)    Physical Exam Vitals reviewed.  HENT:     Head: Normocephalic and atraumatic.  Eyes:     Pupils: Pupils are equal, round, and reactive to light.  Cardiovascular:     Rate and Rhythm: Normal rate and regular rhythm.     Heart sounds: Normal heart sounds.  Pulmonary:     Effort: Pulmonary effort is normal.     Breath sounds: Normal breath sounds.  Abdominal:     General: Bowel sounds are normal.     Palpations: Abdomen is soft.  Musculoskeletal:        General: No tenderness or deformity. Normal range of motion.     Cervical back: Normal range of motion.  Lymphadenopathy:     Cervical: No cervical adenopathy.  Skin:    General: Skin is warm and dry.     Findings: No erythema or rash.  Neurological:     Mental Status: He is alert and oriented to person, place, and time.  Psychiatric:        Behavior: Behavior normal.        Thought Content: Thought content normal.        Judgment: Judgment normal.     Lab Results  Component Value Date   WBC 3.0 (L) 06/17/2023   HGB 10.1 (L) 06/17/2023   HCT 30.5 (L) 06/17/2023   MCV 90.2 06/17/2023   PLT 97 (L) 06/17/2023   Lab Results  Component Value Date   FERRITIN 42 05/20/2023   IRON 112 05/20/2023   TIBC 430 05/20/2023   UIBC 318 05/20/2023   IRONPCTSAT 26 05/20/2023   Lab Results  Component Value Date   RETICCTPCT 2.3 08/31/2022   RBC 3.38 (L) 06/17/2023   Lab Results  Component Value Date   KPAFRELGTCHN 126.9 (H) 06/03/2023   LAMBDASER  6.9 06/03/2023   KAPLAMBRATIO 18.39 (H) 06/03/2023   Lab Results  Component Value Date   IGGSERUM 2,443 (H) 06/03/2023   IGA 25 (L) 06/03/2023   IGMSERUM 18 06/03/2023   Lab Results  Component Value Date  TOTALPROTELP 7.3 06/03/2023   ALBUMINELP 3.7 06/03/2023   A1GS 0.3 06/03/2023   A2GS 0.8 06/03/2023   BETS 0.9 06/03/2023   GAMS 1.7 06/03/2023   MSPIKE 1.3 (H) 06/03/2023   SPEI Comment 05/20/2023     Chemistry      Component Value Date/Time   NA 136 06/17/2023 0856   NA 139 06/20/2020 0923   K 4.1 06/17/2023 0856   CL 102 06/17/2023 0856   CO2 26 06/17/2023 0856   BUN 22 06/17/2023 0856   BUN 17 06/20/2020 0923   CREATININE 1.24 06/17/2023 0856   CREATININE 1.12 09/03/2015 1201      Component Value Date/Time   CALCIUM 9.1 06/17/2023 0856   ALKPHOS 43 06/17/2023 0856   AST 14 (L) 06/17/2023 0856   ALT 14 06/17/2023 0856   BILITOT 0.4 06/17/2023 0856       Impression and Plan: Mr. Delsordo is a very pleasant 77 yo caucasian gentleman with IgG kappa multiple myeloma.  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  His FISH panel was negative. Currently he is on daratumumab/Velcade/Revlimid without Decadron.   I have to believe that his monoclonal studies will improve.  I think that we can get his blood sugars better, the treatment will work better.  He will get his daratumumab today.  He is going have a little bit of a vacation over New Years holiday.  We will plan to see him back after that.   Josph Macho, MD 12/19/20249:51 AM

## 2023-06-17 NOTE — Progress Notes (Signed)
OK to treat with ANC and platelets values from today per Dr. Myna Hidalgo.

## 2023-06-18 LAB — IGG, IGA, IGM
IgA: 23 mg/dL — ABNORMAL LOW (ref 61–437)
IgG (Immunoglobin G), Serum: 1743 mg/dL — ABNORMAL HIGH (ref 603–1613)
IgM (Immunoglobulin M), Srm: 15 mg/dL (ref 15–143)

## 2023-06-21 LAB — KAPPA/LAMBDA LIGHT CHAINS
Kappa free light chain: 100.5 mg/L — ABNORMAL HIGH (ref 3.3–19.4)
Kappa, lambda light chain ratio: 11.42 — ABNORMAL HIGH (ref 0.26–1.65)
Lambda free light chains: 8.8 mg/L (ref 5.7–26.3)

## 2023-06-24 ENCOUNTER — Inpatient Hospital Stay: Payer: Medicare Other

## 2023-06-24 ENCOUNTER — Other Ambulatory Visit: Payer: Self-pay | Admitting: *Deleted

## 2023-06-24 DIAGNOSIS — C9000 Multiple myeloma not having achieved remission: Secondary | ICD-10-CM

## 2023-06-24 MED ORDER — LENALIDOMIDE 15 MG PO CAPS: 15.0000 mg | ORAL_CAPSULE | Freq: Every day | ORAL | 0 refills | Status: DC

## 2023-07-05 LAB — IMMUNOFIXATION REFLEX, SERUM
IgA: 25 mg/dL — ABNORMAL LOW (ref 61–437)
IgG (Immunoglobin G), Serum: 1906 mg/dL — ABNORMAL HIGH (ref 603–1613)
IgM (Immunoglobulin M), Srm: 16 mg/dL (ref 15–143)

## 2023-07-05 LAB — PROTEIN ELECTROPHORESIS, SERUM, WITH REFLEX
A/G Ratio: 1 (ref 0.7–1.7)
Albumin ELP: 3.4 g/dL (ref 2.9–4.4)
Alpha-1-Globulin: 0.3 g/dL (ref 0.0–0.4)
Alpha-2-Globulin: 0.7 g/dL (ref 0.4–1.0)
Beta Globulin: 0.9 g/dL (ref 0.7–1.3)
Gamma Globulin: 1.5 g/dL (ref 0.4–1.8)
Globulin, Total: 3.3 g/dL (ref 2.2–3.9)
M-Spike, %: 1 g/dL — ABNORMAL HIGH
SPEP Interpretation: 0
Total Protein ELP: 6.7 g/dL (ref 6.0–8.5)

## 2023-07-08 ENCOUNTER — Inpatient Hospital Stay: Payer: Medicare Other | Attending: Hematology & Oncology

## 2023-07-08 ENCOUNTER — Encounter: Payer: Self-pay | Admitting: Hematology & Oncology

## 2023-07-08 ENCOUNTER — Inpatient Hospital Stay (HOSPITAL_BASED_OUTPATIENT_CLINIC_OR_DEPARTMENT_OTHER): Payer: Medicare Other | Admitting: Hematology & Oncology

## 2023-07-08 ENCOUNTER — Inpatient Hospital Stay: Payer: Medicare Other

## 2023-07-08 VITALS — BP 139/57 | HR 65 | Temp 98.0°F | Resp 20 | Ht 69.0 in | Wt 184.4 lb

## 2023-07-08 DIAGNOSIS — Z79899 Other long term (current) drug therapy: Secondary | ICD-10-CM | POA: Insufficient documentation

## 2023-07-08 DIAGNOSIS — C9000 Multiple myeloma not having achieved remission: Secondary | ICD-10-CM | POA: Insufficient documentation

## 2023-07-08 DIAGNOSIS — Z5112 Encounter for antineoplastic immunotherapy: Secondary | ICD-10-CM | POA: Insufficient documentation

## 2023-07-08 DIAGNOSIS — D509 Iron deficiency anemia, unspecified: Secondary | ICD-10-CM | POA: Insufficient documentation

## 2023-07-08 LAB — CMP (CANCER CENTER ONLY)
ALT: 11 U/L (ref 0–44)
AST: 13 U/L — ABNORMAL LOW (ref 15–41)
Albumin: 4.2 g/dL (ref 3.5–5.0)
Alkaline Phosphatase: 56 U/L (ref 38–126)
Anion gap: 9 (ref 5–15)
BUN: 16 mg/dL (ref 8–23)
CO2: 27 mmol/L (ref 22–32)
Calcium: 9.4 mg/dL (ref 8.9–10.3)
Chloride: 98 mmol/L (ref 98–111)
Creatinine: 1.16 mg/dL (ref 0.61–1.24)
GFR, Estimated: >60 mL/min (ref >60)
Glucose, Bld: 284 mg/dL — ABNORMAL HIGH (ref 70–99)
Potassium: 3.5 mmol/L (ref 3.5–5.1)
Sodium: 134 mmol/L — ABNORMAL LOW (ref 135–145)
Total Bilirubin: 0.4 mg/dL (ref 0.0–1.2)
Total Protein: 7.4 g/dL (ref 6.5–8.1)

## 2023-07-08 LAB — CBC WITH DIFFERENTIAL (CANCER CENTER ONLY)
Abs Immature Granulocytes: 0.01 10*3/uL (ref 0.00–0.07)
Basophils Absolute: 0 10*3/uL (ref 0.0–0.1)
Basophils Relative: 0 %
Eosinophils Absolute: 0.1 10*3/uL (ref 0.0–0.5)
Eosinophils Relative: 3 %
HCT: 32 % — ABNORMAL LOW (ref 39.0–52.0)
Hemoglobin: 10.6 g/dL — ABNORMAL LOW (ref 13.0–17.0)
Immature Granulocytes: 0 %
Lymphocytes Relative: 25 %
Lymphs Abs: 1.2 10*3/uL (ref 0.7–4.0)
MCH: 28.8 pg (ref 26.0–34.0)
MCHC: 33.1 g/dL (ref 30.0–36.0)
MCV: 87 fL (ref 80.0–100.0)
Monocytes Absolute: 0.5 10*3/uL (ref 0.1–1.0)
Monocytes Relative: 9 %
Neutro Abs: 3 10*3/uL (ref 1.7–7.7)
Neutrophils Relative %: 63 %
Platelet Count: 169 10*3/uL (ref 150–400)
RBC: 3.68 MIL/uL — ABNORMAL LOW (ref 4.22–5.81)
RDW: 13.4 % (ref 11.5–15.5)
WBC Count: 4.8 10*3/uL (ref 4.0–10.5)
nRBC: 0 % (ref 0.0–0.2)

## 2023-07-08 LAB — LACTATE DEHYDROGENASE: LDH: 151 U/L (ref 98–192)

## 2023-07-08 MED ORDER — DARATUMUMAB-HYALURONIDASE-FIHJ 1800-30000 MG-UT/15ML ~~LOC~~ SOLN
1800.0000 mg | Freq: Once | SUBCUTANEOUS | Status: AC
  Administered 2023-07-08: 1800 mg via SUBCUTANEOUS
  Filled 2023-07-08: qty 15

## 2023-07-08 MED ORDER — ACETAMINOPHEN 325 MG PO TABS
650.0000 mg | ORAL_TABLET | Freq: Once | ORAL | Status: DC
Start: 2023-07-08 — End: 2023-07-08

## 2023-07-08 MED ORDER — DIPHENHYDRAMINE HCL 25 MG PO CAPS: 50.0000 mg | ORAL_CAPSULE | Freq: Once | ORAL | Status: DC

## 2023-07-08 MED ORDER — BORTEZOMIB CHEMO SQ INJECTION 3.5 MG (2.5MG/ML)
1.3000 mg/m2 | Freq: Once | INTRAMUSCULAR | Status: AC
  Administered 2023-07-08: 2.5 mg via SUBCUTANEOUS
  Filled 2023-07-08: qty 1

## 2023-07-08 NOTE — Patient Instructions (Signed)
 [facility]  Discharge Instructions: Thank you for choosing [facility] to provide your oncology and hematology care.   If you have a lab appointment with the Cancer Center, please go directly to the Cancer Center and check in at the registration area.  Wear comfortable clothing and clothing appropriate for easy access to any Portacath or PICC line.   We strive to give you quality time with your provider. You may need to reschedule your appointment if you arrive late (15 or more minutes).  Arriving late affects you and other patients whose appointments are after yours.  Also, if you miss three or more appointments without notifying the office, you may be dismissed from the clinic at the provider's discretion.      For prescription refill requests, have your pharmacy contact our office and allow 72 hours for refills to be completed.    Today you received the following chemotherapy and/or immunotherapy agents: Velcade and Faspro      To help prevent nausea and vomiting after your treatment, we encourage you to take your nausea medication as directed.  BELOW ARE SYMPTOMS THAT SHOULD BE REPORTED IMMEDIATELY: *FEVER GREATER THAN 100.4 F (38 C) OR HIGHER *CHILLS OR SWEATING *NAUSEA AND VOMITING THAT IS NOT CONTROLLED WITH YOUR NAUSEA MEDICATION *UNUSUAL SHORTNESS OF BREATH *UNUSUAL BRUISING OR BLEEDING *URINARY PROBLEMS (pain or burning when urinating, or frequent urination) *BOWEL PROBLEMS (unusual diarrhea, constipation, pain near the anus) TENDERNESS IN MOUTH AND THROAT WITH OR WITHOUT PRESENCE OF ULCERS (sore throat, sores in mouth, or a toothache) UNUSUAL RASH, SWELLING OR PAIN  UNUSUAL VAGINAL DISCHARGE OR ITCHING   Items with * indicate a potential emergency and should be followed up as soon as possible or go to the Emergency Department if any problems should occur.  Please show the CHEMOTHERAPY ALERT CARD or  IMMUNOTHERAPY ALERT CARD at check-in to the Emergency Department and triage nurse. Should you have questions after your visit or need to cancel or reschedule your appointment, please contact [facility] - A DEPT OF [facility]  [phone] and follow the prompts.  Office hours are 8:00 a.m. to 4:30 p.m. Monday - Friday. Please note that voicemails left after 4:00 p.m. may not be returned until the following business day.  We are closed weekends and major holidays. You have access to a nurse at all times for urgent questions. Please call the main number to the clinic [phone] and follow the prompts.  For any non-urgent questions, you may also contact your provider using MyChart. We now offer e-Visits for anyone 57 and older to request care online for non-urgent symptoms. For details visit [email or web address].   Also download the MyChart app! Go to the app store, search "MyChart", open the app, select [place], and log in with your MyChart username and password.

## 2023-07-08 NOTE — Progress Notes (Signed)
 Hematology and Oncology Follow Up Visit  [name] [id number] 1946/06/24 78 y.o. 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  He enjoyed the holiday season.  He had acquired Christmas and New Years.  Thankfully, his last myeloma studies were improving.  His monoclonal spike was down to 1.0 g/dL.  His IgG level was 1800 mg/dL.  The Kappa light chain was 10 mg/dL.  He has had no fever.  His blood sugars are being managed by his family doctor.  I know that he is doing a good job with this.  He has had no problems with bowels or bladder.  He has had a little bit of a pruritus with the Revlimid .  He is doing better now that he is on Revlimid  for 2 weeks and 3 weeks.  He has had no bleeding.  Overall, I would say his performance status is probably ECOG 1.      Wt Readings from Last 3 Encounters:  07/08/23 184 lb 6.4 oz (83.6 kg)  06/17/23 182 lb 12.8 oz (82.9 kg)  06/03/23 180 lb (81.6 kg)    Medications:  Allergies as of 07/08/2023       Reactions   Clindamycin Hcl Itching   Famvir  [famciclovir ] Itching   Metoprolol  Succinate Other (See Comments)   Pt does not recall.        Medication List        Accurate as of July 08, 2023  8:26 AM. If you have any questions, ask your nurse or doctor.          acyclovir  200 MG capsule Commonly known as: ZOVIRAX  Take 1 capsule (200 mg total) by mouth 2 (two) times daily.   amLODipine 10 MG tablet Commonly known as: NORVASC Take 10 mg by mouth every morning.   aspirin  EC 325 MG tablet Take 325 mg by mouth daily.   ezetimibe 10 MG tablet Commonly known  as: ZETIA Take 10 mg by mouth daily.   furosemide  20 MG tablet Commonly known as: LASIX  Take 20 mg by mouth daily.   lenalidomide  15 MG capsule Commonly known as: REVLIMID  Take 1 capsule (15 mg total) by mouth daily. Take for 21 days, then 7 days off. Repeat every 28 days. [employer] Auth #[id number]   metFORMIN 500 MG 24 hr tablet Commonly known as: GLUCOPHAGE-XR Take 4 tablets by mouth daily.   Mounjaro 5 MG/0.5ML Pen Generic drug: tirzepatide Inject 2.5 mg into the skin once a week.   omeprazole  20 MG capsule Commonly known as: PRILOSEC Take 20 mg by mouth 2 (two) times daily before a meal.   ondansetron  8 MG tablet Commonly known as: Zofran  Take 1 tablet (8 mg total) by mouth every 8 (eight) hours as needed for nausea or vomiting.   prochlorperazine  10 MG tablet Commonly known as: COMPAZINE  Take 1 tablet (10 mg total) by mouth every 6 (six) hours as needed for nausea or vomiting.   rosuvastatin  40 MG tablet Commonly known as: CRESTOR  Take 40 mg by mouth daily.   Testosterone  25 MG/2.5GM (1%) Gel Place 1 mg onto the  skin every other day.   triamterene-hydrochlorothiazide 37.5-25 MG tablet Commonly known as: MAXZIDE-25 Take 1 tablet by mouth every morning.   valsartan 320 MG tablet Commonly known as: DIOVAN Take 320 mg by mouth daily.        Allergies:  Allergies  Allergen Reactions   Clindamycin Hcl Itching   Famvir  [Famciclovir ] Itching   Metoprolol  Succinate Other (See Comments)    Pt does not recall.    Past Medical History, Surgical history, Social history, and Family History were reviewed and updated.  Review of Systems: Review of Systems  Constitutional: Negative.   HENT: Negative.    Eyes: Negative.   Respiratory: Negative.    Cardiovascular: Negative.   Gastrointestinal: Negative.   Genitourinary: Negative.   Musculoskeletal: Negative.   Skin: Negative.   Neurological: Negative.   Endo/Heme/Allergies: Negative.   Psychiatric/Behavioral:  Negative.        Physical Exam:  height is 5' 9 (1.753 m) and weight is 184 lb 6.4 oz (83.6 kg). His oral temperature is 98 F (36.7 C). His blood pressure is 139/57 (abnormal) and his pulse is 65. His respiration is 20 and oxygen saturation is 100%.    Wt Readings from Last 3 Encounters:  07/08/23 184 lb 6.4 oz (83.6 kg)  06/17/23 182 lb 12.8 oz (82.9 kg)  06/03/23 180 lb (81.6 kg)    Physical Exam Vitals reviewed.  HENT:     Head: Normocephalic and atraumatic.  Eyes:     Pupils: Pupils are equal, round, and reactive to light.  Cardiovascular:     Rate and Rhythm: Normal rate and regular rhythm.     Heart sounds: Normal heart sounds.  Pulmonary:     Effort: Pulmonary effort is normal.     Breath sounds: Normal breath sounds.  Abdominal:     General: Bowel sounds are normal.     Palpations: Abdomen is soft.  Musculoskeletal:        General: No tenderness or deformity. Normal range of motion.     Cervical back: Normal range of motion.  Lymphadenopathy:     Cervical: No cervical adenopathy.  Skin:    General: Skin is warm and dry.     Findings: No erythema or rash.  Neurological:     Mental Status: He is alert and oriented to person, place, and time.  Psychiatric:        Behavior: Behavior normal.        Thought Content: Thought content normal.        Judgment: Judgment normal.      Lab Results  Component Value Date   WBC 4.8 07/08/2023   HGB 10.6 (L) 07/08/2023   HCT 32.0 (L) 07/08/2023   MCV 87.0 07/08/2023   PLT 169 07/08/2023   Lab Results  Component Value Date   FERRITIN 42 05/20/2023   IRON 112 05/20/2023   TIBC 430 05/20/2023   UIBC 318 05/20/2023   IRONPCTSAT 26 05/20/2023   Lab Results  Component Value Date   RETICCTPCT 2.3 08/31/2022   RBC 3.68 (L) 07/08/2023   Lab Results  Component Value Date   KPAFRELGTCHN 100.5 (H) 06/17/2023   LAMBDASER 8.8 06/17/2023   KAPLAMBRATIO 11.42 (H) 06/17/2023   Lab Results  Component Value Date    IGGSERUM 1,743 (H) 06/17/2023   IGGSERUM 1,906 (H) 06/17/2023   IGA 23 (L) 06/17/2023   IGA 25 (L) 06/17/2023   IGMSERUM 15 06/17/2023   IGMSERUM 16 06/17/2023   Lab Results  Component  Value Date   TOTALPROTELP 6.7 06/17/2023   ALBUMINELP 3.4 06/17/2023   A1GS 0.3 06/17/2023   A2GS 0.7 06/17/2023   BETS 0.9 06/17/2023   GAMS 1.5 06/17/2023   MSPIKE 1.0 (H) 06/17/2023   SPEI Comment 05/20/2023     Chemistry      Component Value Date/Time   NA 136 06/17/2023 0856   NA 139 06/20/2020 0923   K 4.1 06/17/2023 0856   CL 102 06/17/2023 0856   CO2 26 06/17/2023 0856   BUN 22 06/17/2023 0856   BUN 17 06/20/2020 0923   CREATININE 1.24 06/17/2023 0856   CREATININE 1.12 09/03/2015 1201      Component Value Date/Time   CALCIUM  9.1 06/17/2023 0856   ALKPHOS 43 06/17/2023 0856   AST 14 (L) 06/17/2023 0856   ALT 14 06/17/2023 0856   BILITOT 0.4 06/17/2023 0856       Impression and Plan: [name] is a very pleasant 78 yo caucasian gentleman with IgG kappa multiple myeloma.  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  His FISH panel was negative. Currently he is on daratumumab /Velcade /Revlimid  without Decadron .   Hopefully, we will see that his monoclonal studies are continue to improve.  I am just happy that his quality of life is doing better.  I am really focused on his quality of life.  I will plan to get him back to see us  in another 3 weeks.    Maude JONELLE Crease, MD 1/9/20258:26 AM

## 2023-07-09 ENCOUNTER — Ambulatory Visit
Admission: RE | Admit: 2023-07-09 | Discharge: 2023-07-09 | Disposition: A | Discharge status: Home / Self Care | Payer: Medicare Other | Source: Ambulatory Visit | Attending: Family Medicine | Admitting: Family Medicine

## 2023-07-09 DIAGNOSIS — I712 Thoracic aortic aneurysm, without rupture, unspecified: Secondary | ICD-10-CM

## 2023-07-09 LAB — KAPPA/LAMBDA LIGHT CHAINS
Kappa free light chain: 127 mg/L — ABNORMAL HIGH (ref 3.3–19.4)
Kappa, lambda light chain ratio: 13.8 — ABNORMAL HIGH (ref 0.26–1.65)
Lambda free light chains: 9.2 mg/L (ref 5.7–26.3)

## 2023-07-09 MED ORDER — IOPAMIDOL (ISOVUE-370) INJECTION 76%
100.0000 mL | Freq: Once | INTRAVENOUS | Status: AC | PRN
  Administered 2023-07-09: 75 mL via INTRAVENOUS

## 2023-07-10 LAB — IGG, IGA, IGM
IgA: 24 mg/dL — ABNORMAL LOW (ref 61–437)
IgG (Immunoglobin G), Serum: 1786 mg/dL — ABNORMAL HIGH (ref 603–1613)
IgM (Immunoglobulin M), Srm: 22 mg/dL (ref 15–143)

## 2023-07-15 ENCOUNTER — Inpatient Hospital Stay: Payer: Medicare Other

## 2023-07-16 LAB — PROTEIN ELECTROPHORESIS, SERUM, WITH REFLEX
A/G Ratio: 0.9 (ref 0.7–1.7)
Albumin ELP: 3.4 g/dL (ref 2.9–4.4)
Alpha-1-Globulin: 0.2 g/dL (ref 0.0–0.4)
Alpha-2-Globulin: 0.8 g/dL (ref 0.4–1.0)
Beta Globulin: 0.9 g/dL (ref 0.7–1.3)
Gamma Globulin: 1.6 g/dL (ref 0.4–1.8)
Globulin, Total: 3.6 g/dL (ref 2.2–3.9)
M-Spike, %: 1.1 g/dL — ABNORMAL HIGH
SPEP Interpretation: 0
Total Protein ELP: 7 g/dL (ref 6.0–8.5)

## 2023-07-16 LAB — IMMUNOFIXATION REFLEX, SERUM
IgA: 26 mg/dL — ABNORMAL LOW (ref 61–437)
IgG (Immunoglobin G), Serum: 1988 mg/dL — ABNORMAL HIGH (ref 603–1613)
IgM (Immunoglobulin M), Srm: 23 mg/dL (ref 15–143)

## 2023-07-22 ENCOUNTER — Encounter: Payer: Self-pay | Admitting: Hematology & Oncology

## 2023-07-27 ENCOUNTER — Other Ambulatory Visit: Payer: Self-pay

## 2023-07-27 DIAGNOSIS — C9000 Multiple myeloma not having achieved remission: Secondary | ICD-10-CM

## 2023-07-27 MED ORDER — LENALIDOMIDE 15 MG PO CAPS: 15.0000 mg | ORAL_CAPSULE | Freq: Every day | ORAL | 0 refills | Status: DC

## 2023-07-29 ENCOUNTER — Other Ambulatory Visit: Payer: Self-pay

## 2023-07-29 ENCOUNTER — Inpatient Hospital Stay: Payer: Medicare Other

## 2023-07-29 ENCOUNTER — Inpatient Hospital Stay: Payer: Medicare Other | Admitting: Hematology & Oncology

## 2023-07-29 ENCOUNTER — Encounter: Payer: Self-pay | Admitting: Hematology & Oncology

## 2023-07-29 VITALS — BP 113/51 | HR 64 | Resp 17

## 2023-07-29 VITALS — BP 112/53 | HR 65 | Temp 98.3°F | Resp 18 | Ht 69.0 in | Wt 178.0 lb

## 2023-07-29 DIAGNOSIS — C9000 Multiple myeloma not having achieved remission: Secondary | ICD-10-CM

## 2023-07-29 DIAGNOSIS — Z5112 Encounter for antineoplastic immunotherapy: Secondary | ICD-10-CM | POA: Diagnosis not present

## 2023-07-29 LAB — CBC WITH DIFFERENTIAL (CANCER CENTER ONLY)
Abs Immature Granulocytes: 0.01 10*3/uL (ref 0.00–0.07)
Basophils Absolute: 0 10*3/uL (ref 0.0–0.1)
Basophils Relative: 1 %
Eosinophils Absolute: 0.5 10*3/uL (ref 0.0–0.5)
Eosinophils Relative: 11 %
HCT: 31.2 % — ABNORMAL LOW (ref 39.0–52.0)
Hemoglobin: 10.5 g/dL — ABNORMAL LOW (ref 13.0–17.0)
Immature Granulocytes: 0 %
Lymphocytes Relative: 36 %
Lymphs Abs: 1.7 10*3/uL (ref 0.7–4.0)
MCH: 28.9 pg (ref 26.0–34.0)
MCHC: 33.7 g/dL (ref 30.0–36.0)
MCV: 86 fL (ref 80.0–100.0)
Monocytes Absolute: 0.9 10*3/uL (ref 0.1–1.0)
Monocytes Relative: 19 %
Neutro Abs: 1.5 10*3/uL — ABNORMAL LOW (ref 1.7–7.7)
Neutrophils Relative %: 33 %
Platelet Count: 140 10*3/uL — ABNORMAL LOW (ref 150–400)
RBC: 3.63 MIL/uL — ABNORMAL LOW (ref 4.22–5.81)
RDW: 13.5 % (ref 11.5–15.5)
WBC Count: 4.6 10*3/uL (ref 4.0–10.5)
nRBC: 0 % (ref 0.0–0.2)

## 2023-07-29 LAB — CMP (CANCER CENTER ONLY)
ALT: 17 U/L (ref 0–44)
AST: 18 U/L (ref 15–41)
Albumin: 4 g/dL (ref 3.5–5.0)
Alkaline Phosphatase: 49 U/L (ref 38–126)
Anion gap: 10 (ref 5–15)
BUN: 23 mg/dL (ref 8–23)
CO2: 26 mmol/L (ref 22–32)
Calcium: 9.3 mg/dL (ref 8.9–10.3)
Chloride: 99 mmol/L (ref 98–111)
Creatinine: 1.19 mg/dL (ref 0.61–1.24)
GFR, Estimated: >60 mL/min (ref >60)
Glucose, Bld: 240 mg/dL — ABNORMAL HIGH (ref 70–99)
Potassium: 4 mmol/L (ref 3.5–5.1)
Sodium: 135 mmol/L (ref 135–145)
Total Bilirubin: 0.4 mg/dL (ref 0.0–1.2)
Total Protein: 7.3 g/dL (ref 6.5–8.1)

## 2023-07-29 LAB — LACTATE DEHYDROGENASE: LDH: 117 U/L (ref 98–192)

## 2023-07-29 MED ORDER — DIPHENHYDRAMINE HCL 25 MG PO CAPS: 50.0000 mg | ORAL_CAPSULE | Freq: Once | ORAL | Status: DC

## 2023-07-29 MED ORDER — ACETAMINOPHEN 325 MG PO TABS: 650.0000 mg | ORAL_TABLET | Freq: Once | ORAL | Status: DC

## 2023-07-29 MED ORDER — DARATUMUMAB-HYALURONIDASE-FIHJ 1800-30000 MG-UT/15ML ~~LOC~~ SOLN
1800.0000 mg | Freq: Once | SUBCUTANEOUS | Status: AC
  Administered 2023-07-29: 1800 mg via SUBCUTANEOUS
  Filled 2023-07-29: qty 15

## 2023-07-29 MED ORDER — BORTEZOMIB CHEMO SQ INJECTION 3.5 MG (2.5MG/ML)
1.3000 mg/m2 | Freq: Once | INTRAMUSCULAR | Status: AC
  Administered 2023-07-29: 2.5 mg via SUBCUTANEOUS
  Filled 2023-07-29: qty 1

## 2023-07-29 NOTE — Patient Instructions (Signed)
[facility]  Discharge Instructions: Thank you for choosing [facility] to provide your oncology and hematology care.   If you have a lab appointment with the Cancer Center, please go directly to the Cancer Center and check in at the registration area.  Wear comfortable clothing and clothing appropriate for easy access to any Portacath or PICC line.   We strive to give you quality time with your provider. You may need to reschedule your appointment if you arrive late (15 or more minutes).  Arriving late affects you and other patients whose appointments are after yours.  Also, if you miss three or more appointments without notifying the office, you may be dismissed from the clinic at the provider's discretion.      For prescription refill requests, have your pharmacy contact our office and allow 72 hours for refills to be completed.    Today you received the following chemotherapy and/or immunotherapy agents darzalex,velcade     To help prevent nausea and vomiting after your treatment, we encourage you to take your nausea medication as directed.  BELOW ARE SYMPTOMS THAT SHOULD BE REPORTED IMMEDIATELY: *FEVER GREATER THAN 100.4 F (38 C) OR HIGHER *CHILLS OR SWEATING *NAUSEA AND VOMITING THAT IS NOT CONTROLLED WITH YOUR NAUSEA MEDICATION *UNUSUAL SHORTNESS OF BREATH *UNUSUAL BRUISING OR BLEEDING *URINARY PROBLEMS (pain or burning when urinating, or frequent urination) *BOWEL PROBLEMS (unusual diarrhea, constipation, pain near the anus) TENDERNESS IN MOUTH AND THROAT WITH OR WITHOUT PRESENCE OF ULCERS (sore throat, sores in mouth, or a toothache) UNUSUAL RASH, SWELLING OR PAIN  UNUSUAL VAGINAL DISCHARGE OR ITCHING   Items with * indicate a potential emergency and should be followed up as soon as possible or go to the Emergency Department if any problems should occur.  Please show the CHEMOTHERAPY ALERT CARD or  IMMUNOTHERAPY ALERT CARD at check-in to the Emergency Department and triage nurse. Should you have questions after your visit or need to cancel or reschedule your appointment, please contact [facility] - A DEPT OF [facility]  [phone] and follow the prompts.  Office hours are 8:00 a.m. to 4:30 p.m. Monday - Friday. Please note that voicemails left after 4:00 p.m. may not be returned until the following business day.  We are closed weekends and major holidays. You have access to a nurse at all times for urgent questions. Please call the main number to the clinic [phone] and follow the prompts.  For any non-urgent questions, you may also contact your provider using MyChart. We now offer e-Visits for anyone 10 and older to request care online for non-urgent symptoms. For details visit [email or web address].   Also download the MyChart app! Go to the app store, search "MyChart", open the app, select [place], and log in with your MyChart username and password.

## 2023-07-29 NOTE — Progress Notes (Signed)
Hematology and Oncology Follow Up Visit  [name] [id number] 05-27-1946 78 y.o. 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  So far, he is doing okay.  He really has had no complaints.  Yesterday played golf this week with the warm weather.  We have adjusted the dose of his Revlimid.  He seems to doing a little better with this.  When we last saw him, his monoclonal spike was 1.1 g/dL.  His IgG level was 1800 mg/dL.  The Kappa light chain was he enjoyed the holiday season.  He had acquired Christmas and New Years.  Thankfully, his last myeloma studies were improving.  His monoclonal spike was down to 1.0 g/dL.  His IgG level was 1820 mg/dL.  The Kappa light chain was 12.7 mg/dL.  All this is holding pretty stable.  Again, I truly believe that his biggest problem is going to be his blood sugars.  His diabetes is still not well-controlled.  His blood sugar today was 240.  He has had no problems with bowels or bladder.  There is no diarrhea.  He has had no cough or shortness of breath.  He has had no leg swelling.  He has had no rashes.  Overall, I would say performance status about ECOG 1.     Wt Readings from Last 3 Encounters:  07/29/23 178 lb (80.7 kg)  07/08/23 184 lb 6.4 oz (83.6 kg)  06/17/23 182 lb 12.8 oz (82.9 kg)    Medications:  Allergies as of 07/29/2023       Reactions   Clindamycin Hcl Itching   Famvir [famciclovir] Itching   Metoprolol Succinate Other (See Comments)   Pt does not recall.        Medication List        Accurate as of July 29, 2023  9:38 AM. If you have any questions,  ask your nurse or doctor.          acyclovir 200 MG capsule Commonly known as: ZOVIRAX Take 1 capsule (200 mg total) by mouth 2 (two) times daily.   amLODipine 10 MG tablet Commonly known as: NORVASC Take 10 mg by mouth every morning.   aspirin EC 325 MG tablet Take 325 mg by mouth daily.   ezetimibe 10 MG tablet Commonly known as: ZETIA Take 10 mg by mouth daily.   furosemide 20 MG tablet Commonly known as: LASIX Take 20 mg by mouth daily.   glipiZIDE 2.5 MG 24 hr tablet Commonly known as: GLUCOTROL XL Take 2.5 mg by mouth daily.   lenalidomide 15 MG capsule Commonly known as: REVLIMID Take 1 capsule (15 mg total) by mouth daily. Take for 21 days, then 7 days off. Repeat every 28 days. [employer] Auth # [id number]   metFORMIN 500 MG 24 hr tablet Commonly known as: GLUCOPHAGE-XR Take 4 tablets by mouth daily.   Mounjaro 5 MG/0.5ML Pen Generic drug: tirzepatide Inject 2.5 mg into the skin once a week.   omeprazole 20 MG capsule Commonly known as: PRILOSEC Take 20 mg by mouth 2 (two) times daily before a meal.  ondansetron 8 MG tablet Commonly known as: Zofran Take 1 tablet (8 mg total) by mouth every 8 (eight) hours as needed for nausea or vomiting.   prochlorperazine 10 MG tablet Commonly known as: COMPAZINE Take 1 tablet (10 mg total) by mouth every 6 (six) hours as needed for nausea or vomiting.   rosuvastatin 40 MG tablet Commonly known as: CRESTOR Take 40 mg by mouth daily.   Testosterone 25 MG/2.5GM (1%) Gel Place 1 mg onto the skin every other day.   triamterene-hydrochlorothiazide 37.5-25 MG tablet Commonly known as: MAXZIDE-25 Take 1 tablet by mouth every morning.   valsartan 320 MG tablet Commonly known as: DIOVAN Take 320 mg by mouth daily.        Allergies:  Allergies  Allergen Reactions   Clindamycin Hcl Itching   Famvir [Famciclovir] Itching   Metoprolol Succinate Other (See Comments)    Pt does not recall.    Past Medical  History, Surgical history, Social history, and Family History were reviewed and updated.  Review of Systems: Review of Systems  Constitutional: Negative.   HENT: Negative.    Eyes: Negative.   Respiratory: Negative.    Cardiovascular: Negative.   Gastrointestinal: Negative.   Genitourinary: Negative.   Musculoskeletal: Negative.   Skin: Negative.   Neurological: Negative.   Endo/Heme/Allergies: Negative.   Psychiatric/Behavioral: Negative.        Physical Exam:  height is 5\' 9"  (1.753 m) and weight is 178 lb (80.7 kg). His oral temperature is 98.3 F (36.8 C). His blood pressure is 112/53 (abnormal) and his pulse is 65. His respiration is 18 and oxygen saturation is 98%.    Wt Readings from Last 3 Encounters:  07/29/23 178 lb (80.7 kg)  07/08/23 184 lb 6.4 oz (83.6 kg)  06/17/23 182 lb 12.8 oz (82.9 kg)    Physical Exam Vitals reviewed.  HENT:     Head: Normocephalic and atraumatic.  Eyes:     Pupils: Pupils are equal, round, and reactive to light.  Cardiovascular:     Rate and Rhythm: Normal rate and regular rhythm.     Heart sounds: Normal heart sounds.  Pulmonary:     Effort: Pulmonary effort is normal.     Breath sounds: Normal breath sounds.  Abdominal:     General: Bowel sounds are normal.     Palpations: Abdomen is soft.  Musculoskeletal:        General: No tenderness or deformity. Normal range of motion.     Cervical back: Normal range of motion.  Lymphadenopathy:     Cervical: No cervical adenopathy.  Skin:    General: Skin is warm and dry.     Findings: No erythema or rash.  Neurological:     Mental Status: He is alert and oriented to person, place, and time.  Psychiatric:        Behavior: Behavior normal.        Thought Content: Thought content normal.        Judgment: Judgment normal.      Lab Results  Component Value Date   WBC 4.6 07/29/2023   HGB 10.5 (L) 07/29/2023   HCT 31.2 (L) 07/29/2023   MCV 86.0 07/29/2023   PLT 140 (L)  07/29/2023   Lab Results  Component Value Date   FERRITIN 42 05/20/2023   IRON 112 05/20/2023   TIBC 430 05/20/2023   UIBC 318 05/20/2023   IRONPCTSAT 26 05/20/2023   Lab Results  Component Value Date   RETICCTPCT  2.3 08/31/2022   RBC 3.63 (L) 07/29/2023   Lab Results  Component Value Date   KPAFRELGTCHN 127.0 (H) 07/08/2023   LAMBDASER 9.2 07/08/2023   KAPLAMBRATIO 13.80 (H) 07/08/2023   Lab Results  Component Value Date   IGGSERUM 1,786 (H) 07/08/2023   IGGSERUM 1,988 (H) 07/08/2023   IGA 24 (L) 07/08/2023   IGA 26 (L) 07/08/2023   IGMSERUM 22 07/08/2023   IGMSERUM 23 07/08/2023   Lab Results  Component Value Date   TOTALPROTELP 7.0 07/08/2023   ALBUMINELP 3.4 07/08/2023   A1GS 0.2 07/08/2023   A2GS 0.8 07/08/2023   BETS 0.9 07/08/2023   GAMS 1.6 07/08/2023   MSPIKE 1.1 (H) 07/08/2023   SPEI Comment 05/20/2023     Chemistry      Component Value Date/Time   NA 135 07/29/2023 0814   NA 139 06/20/2020 0923   K 4.0 07/29/2023 0814   CL 99 07/29/2023 0814   CO2 26 07/29/2023 0814   BUN 23 07/29/2023 0814   BUN 17 06/20/2020 0923   CREATININE 1.19 07/29/2023 0814   CREATININE 1.12 09/03/2015 1201      Component Value Date/Time   CALCIUM 9.3 07/29/2023 0814   ALKPHOS 49 07/29/2023 0814   AST 18 07/29/2023 0814   ALT 17 07/29/2023 0814   BILITOT 0.4 07/29/2023 0814       Impression and Plan: Mr. Hiney is a very pleasant 78 yo caucasian gentleman with IgG kappa multiple myeloma.  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  His FISH panel was negative. Currently he is on daratumumab/Velcade/Revlimid without Decadron.   Hopefully, we will see that his monoclonal studies improving.  Again, this is all about quality of life.    Maybe, his blood sugars will get better.  I will plan to get him back to see [place] in another 3 weeks.    Josph Macho, MD 1/30/20259:38 AM

## 2023-07-30 LAB — IGG, IGA, IGM
IgA: 27 mg/dL — ABNORMAL LOW (ref 61–437)
IgG (Immunoglobin G), Serum: 1865 mg/dL — ABNORMAL HIGH (ref 603–1613)
IgM (Immunoglobulin M), Srm: 20 mg/dL (ref 15–143)

## 2023-07-31 ENCOUNTER — Other Ambulatory Visit: Payer: Self-pay

## 2023-08-01 LAB — KAPPA/LAMBDA LIGHT CHAINS
Kappa free light chain: 104.9 mg/L — ABNORMAL HIGH (ref 3.3–19.4)
Kappa, lambda light chain ratio: 11.66 — ABNORMAL HIGH (ref 0.26–1.65)
Lambda free light chains: 9 mg/L (ref 5.7–26.3)

## 2023-08-03 ENCOUNTER — Other Ambulatory Visit: Payer: Self-pay

## 2023-08-03 DIAGNOSIS — C9000 Multiple myeloma not having achieved remission: Secondary | ICD-10-CM

## 2023-08-03 MED ORDER — LENALIDOMIDE 15 MG PO CAPS: 15.0000 mg | ORAL_CAPSULE | Freq: Every day | ORAL | 0 refills | Status: DC

## 2023-08-04 ENCOUNTER — Ambulatory Visit: Payer: Medicare Other | Attending: Internal Medicine | Admitting: Internal Medicine

## 2023-08-04 ENCOUNTER — Encounter: Payer: Self-pay | Admitting: Internal Medicine

## 2023-08-04 VITALS — BP 114/66 | HR 67 | Ht 69.0 in | Wt 179.0 lb

## 2023-08-04 DIAGNOSIS — I4510 Unspecified right bundle-branch block: Secondary | ICD-10-CM | POA: Insufficient documentation

## 2023-08-04 NOTE — Progress Notes (Signed)
 Patient Care Team: Bob Coy, MD as PCP - General (Family Medicine) Maryelizabeth Callander, MD (Inactive) as Medical Oncologist (Hematology)   HPI  [name] is a 78 y.o. male  Seen in followup for a history of a catheterization in 2008 demonstrate severe LAD disease and moderate circumflex disease with an occluded posterolateral branch. He had normal LV function with perfusion abnormality on Myoview  scan I>> t DES stenting Thoracic aortic aneurysm   Intercurrently diagnosed with IgG Kappa myeloma  >> PET neg 1/25   DATE TEST    9/16 CTA 4.3 cm   3/17 CTA 4.3 cm  5/18 CTA 4.3 cm  9/19 CTA 4.3 cm  1/22 CTA 4.3 cm  1/25  CTA 4.4 cm    Date Cr K Hgb  7/21 1.11 3.7 13.2   8/22 1.01 3.6 11.7    DATE TEST EF    2013 Echo   55-60 %    9/19* Myoview   52 % No ischemia/Infarct  12/21 Echo 60-65 %    8/22 Echo 60-65 % 4.5 cm     The patient denies chest pain, shortness of breath, nocturnal dyspnea, orthopnea or peripheral edema.  There have been no palpitations, lightheadedness or syncope.        Past Medical History:  Diagnosis Date   Anemia    Arthritis    BPH (benign prostatic hyperplasia)    CAD (coronary artery disease)    Collagen vascular disease (HCC)    Colon polyp 2019   tubular adenomas   Diabetes mellitus without complication (HCC)    Esophageal reflux    Fe deficiency anemia 2012   FHx: colonic polyps 11/04, 01/2009   Gastritis    Hearing loss    wears hearing aid   Heart attack (HCC)    Hemorrhoids, internal    Hyperlipidemia    Hypertension    Hypogonadism male    Multiple myeloma (HCC) 03/15/2023   Smoldering myeloma    Wears glasses     Past Surgical History:  Procedure Laterality Date   APPENDECTOMY     CORONARY ANGIOPLASTY WITH STENT PLACEMENT  2007   HAND SURGERY     left 2010, right 2012   KNEE SURGERY  1994   left   left knee     x3   left shoulder     PROSTATE SURGERY     Right CTS     ?   ROTATOR CUFF REPAIR Right 12/2012    TOTAL KNEE ARTHROPLASTY Right 03/08/2015   Procedure: RIGHT TOTAL KNEE ARTHROPLASTY;  Surgeon: Bob Collet, MD;  Location: [facility];  Service: Orthopedics;  Laterality: Right;   VASECTOMY      Current Outpatient Medications  Medication Sig Dispense Refill   amLODipine (NORVASC) 10 MG tablet Take 10 mg by mouth every morning.     aspirin  EC 325 MG tablet Take 325 mg by mouth daily.     ezetimibe (ZETIA) 10 MG tablet Take 10 mg by mouth daily.     furosemide  (LASIX ) 20 MG tablet Take 20 mg by mouth daily.     glipiZIDE (GLUCOTROL XL) 2.5 MG 24 hr tablet Take 2.5 mg by mouth daily.     lenalidomide  (REVLIMID ) 15 MG capsule Take 1 capsule (15 mg total) by mouth daily. Take for 21 days, then 7 days off. Repeat every 28 days. [employer] Auth # [id number] 21 capsule 0   metFORMIN (GLUCOPHAGE-XR) 500 MG 24 hr tablet Take 4 tablets by  mouth daily.     MOUNJARO 5 MG/0.5ML Pen Inject 2.5 mg into the skin once a week.     omeprazole  (PRILOSEC) 20 MG capsule Take 20 mg by mouth 2 (two) times daily before a meal.      ondansetron  (ZOFRAN ) 8 MG tablet Take 1 tablet (8 mg total) by mouth every 8 (eight) hours as needed for nausea or vomiting. 30 tablet 1   prochlorperazine  (COMPAZINE ) 10 MG tablet Take 1 tablet (10 mg total) by mouth every 6 (six) hours as needed for nausea or vomiting. 30 tablet 1   rosuvastatin  (CRESTOR ) 40 MG tablet Take 40 mg by mouth daily.     Testosterone  25 MG/2.5GM (1%) GEL Place 1 mg onto the skin every other day.     triamterene-hydrochlorothiazide (MAXZIDE-25) 37.5-25 MG tablet Take 1 tablet by mouth every morning.     valsartan (DIOVAN) 320 MG tablet Take 320 mg by mouth daily.     No current facility-administered medications for this visit.    Allergies  Allergen Reactions   Clindamycin Hcl Itching   Famvir  [Famciclovir ] Itching   Metoprolol  Succinate Other (See Comments)    Pt does not recall.    Review of Systems negative except from HPI and PMH  Physical Exam BP  114/66   Pulse 67   Ht 5' 9 (1.753 m)   Wt 179 lb (81.2 kg)   SpO2 98%   BMI 26.43 kg/m  Well developed and well nourished in no acute distress HENT normal Neck supple with JVP-flat Clear  Regular rate and rhythm, no murmur Abd-soft with active BS No Clubbing cyanosis  edema Skin-warm and dry A & Oriented  Grossly normal sensory and motor function  ECG sinus @ 67 RBB/LAFB Occ PACs      Assessment and  Plan  CAD  S/p stenting  Ascending aortic aneurysm   Hypertension  Multiple myeloma  RBBB  Blood pressure well-controlled.  Indeed with some lightheadedness, have suggested we cut back on one of his medications.  He will discuss this with Dr. Windy.  No symptoms of ischemia.  His statin and Zetia are followed by [facility].  Do not know what his LDL is.  Continue these.  Also continue on aspirin  which is high dose per hematology/oncology  Euvolemic.  Continue Lasix  and Maxide.

## 2023-08-04 NOTE — Patient Instructions (Addendum)
Medication Instructions:  Your physician recommends that you continue on your current medications as directed. Please refer to the Current Medication list given to you today.  *If you need a refill on your cardiac medications before your next appointment, please call your pharmacy*   Lab Work: None ordered.  If you have labs (blood work) drawn today and your tests are completely normal, you will receive your results only by: MyChart Message (if you have MyChart) OR A paper copy in the mail If you have any lab test that is abnormal or we need to change your treatment, we will call you to review the results.   Testing/Procedures: None ordered.    Follow-Up: At [facility], you and your health needs are our priority.  As part of our continuing mission to provide you with exceptional heart care, we have created designated Provider Care Teams.  These Care Teams include your primary Cardiologist (physician) and Advanced Practice Providers (APPs -  Physician Assistants and Nurse Practitioners) who all work together to provide you with the care you need, when you need it.  We recommend signing up for the patient portal called "MyChart".  Sign up information is provided on this After Visit Summary.  MyChart is used to connect with patients for Virtual Visits (Telemedicine).  Patients are able to view lab/test results, encounter notes, upcoming appointments, etc.  Non-urgent messages can be sent to your provider as well.   To learn more about what you can do with MyChart, go to [email or web address].    Your next appointment:   Follow up with General Cardiology - 12 months with Dr Nathaniel Man

## 2023-08-05 ENCOUNTER — Other Ambulatory Visit: Payer: Self-pay

## 2023-08-05 ENCOUNTER — Inpatient Hospital Stay: Payer: Medicare Other

## 2023-08-05 ENCOUNTER — Inpatient Hospital Stay: Payer: Medicare Other | Attending: Hematology & Oncology

## 2023-08-05 VITALS — BP 142/63 | HR 66 | Temp 97.9°F | Resp 20

## 2023-08-05 DIAGNOSIS — D509 Iron deficiency anemia, unspecified: Secondary | ICD-10-CM | POA: Diagnosis not present

## 2023-08-05 DIAGNOSIS — Z79899 Other long term (current) drug therapy: Secondary | ICD-10-CM | POA: Insufficient documentation

## 2023-08-05 DIAGNOSIS — C9000 Multiple myeloma not having achieved remission: Secondary | ICD-10-CM | POA: Diagnosis present

## 2023-08-05 DIAGNOSIS — Q999 Chromosomal abnormality, unspecified: Secondary | ICD-10-CM | POA: Diagnosis not present

## 2023-08-05 DIAGNOSIS — Z5112 Encounter for antineoplastic immunotherapy: Secondary | ICD-10-CM | POA: Insufficient documentation

## 2023-08-05 LAB — CBC WITH DIFFERENTIAL (CANCER CENTER ONLY)
Abs Immature Granulocytes: 0.01 10*3/uL (ref 0.00–0.07)
Basophils Absolute: 0 10*3/uL (ref 0.0–0.1)
Basophils Relative: 1 %
Eosinophils Absolute: 1.1 10*3/uL — ABNORMAL HIGH (ref 0.0–0.5)
Eosinophils Relative: 19 %
HCT: 31.6 % — ABNORMAL LOW (ref 39.0–52.0)
Hemoglobin: 10.4 g/dL — ABNORMAL LOW (ref 13.0–17.0)
Immature Granulocytes: 0 %
Lymphocytes Relative: 22 %
Lymphs Abs: 1.3 10*3/uL (ref 0.7–4.0)
MCH: 28.3 pg (ref 26.0–34.0)
MCHC: 32.9 g/dL (ref 30.0–36.0)
MCV: 85.9 fL (ref 80.0–100.0)
Monocytes Absolute: 0.6 10*3/uL (ref 0.1–1.0)
Monocytes Relative: 10 %
Neutro Abs: 2.7 10*3/uL (ref 1.7–7.7)
Neutrophils Relative %: 48 %
Platelet Count: 159 10*3/uL (ref 150–400)
RBC: 3.68 MIL/uL — ABNORMAL LOW (ref 4.22–5.81)
RDW: 13.6 % (ref 11.5–15.5)
WBC Count: 5.7 10*3/uL (ref 4.0–10.5)
nRBC: 0 % (ref 0.0–0.2)

## 2023-08-05 LAB — CMP (CANCER CENTER ONLY)
ALT: 14 U/L (ref 0–44)
AST: 20 U/L (ref 15–41)
Albumin: 4 g/dL (ref 3.5–5.0)
Alkaline Phosphatase: 52 U/L (ref 38–126)
Anion gap: 8 (ref 5–15)
BUN: 15 mg/dL (ref 8–23)
CO2: 26 mmol/L (ref 22–32)
Calcium: 9.5 mg/dL (ref 8.9–10.3)
Chloride: 102 mmol/L (ref 98–111)
Creatinine: 1.17 mg/dL (ref 0.61–1.24)
GFR, Estimated: >60 mL/min (ref >60)
Glucose, Bld: 190 mg/dL — ABNORMAL HIGH (ref 70–99)
Potassium: 3.8 mmol/L (ref 3.5–5.1)
Sodium: 136 mmol/L (ref 135–145)
Total Bilirubin: 0.3 mg/dL (ref 0.0–1.2)
Total Protein: 7.2 g/dL (ref 6.5–8.1)

## 2023-08-05 MED ORDER — BORTEZOMIB CHEMO SQ INJECTION 3.5 MG (2.5MG/ML)
1.3000 mg/m2 | Freq: Once | INTRAMUSCULAR | Status: AC
  Administered 2023-08-05: 2.5 mg via SUBCUTANEOUS
  Filled 2023-08-05: qty 1

## 2023-08-05 MED ORDER — DEXAMETHASONE 4 MG PO TABS
20.0000 mg | ORAL_TABLET | Freq: Once | ORAL | Status: DC
  Filled 2023-08-05: qty 5

## 2023-08-05 NOTE — Patient Instructions (Signed)
 [facility] - A DEPT OF [facility]  Discharge Instructions: Thank you for choosing [facility] to provide your oncology and hematology care.   If you have a lab appointment with the Cancer Center, please go directly to the Cancer Center and check in at the registration area.  Wear comfortable clothing and clothing appropriate for easy access to any Portacath or PICC line.   We strive to give you quality time with your provider. You may need to reschedule your appointment if you arrive late (15 or more minutes).  Arriving late affects you and other patients whose appointments are after yours.  Also, if you miss three or more appointments without notifying the office, you may be dismissed from the clinic at the provider's discretion.      For prescription refill requests, have your pharmacy contact our office and allow 72 hours for refills to be completed.    Today you received the following chemotherapy and/or immunotherapy agents Velcade      To help prevent nausea and vomiting after your treatment, we encourage you to take your nausea medication as directed.  BELOW ARE SYMPTOMS THAT SHOULD BE REPORTED IMMEDIATELY: *FEVER GREATER THAN 100.4 F (38 C) OR HIGHER *CHILLS OR SWEATING *NAUSEA AND VOMITING THAT IS NOT CONTROLLED WITH YOUR NAUSEA MEDICATION *UNUSUAL SHORTNESS OF BREATH *UNUSUAL BRUISING OR BLEEDING *URINARY PROBLEMS (pain or burning when urinating, or frequent urination) *BOWEL PROBLEMS (unusual diarrhea, constipation, pain near the anus) TENDERNESS IN MOUTH AND THROAT WITH OR WITHOUT PRESENCE OF ULCERS (sore throat, sores in mouth, or a toothache) UNUSUAL RASH, SWELLING OR PAIN  UNUSUAL VAGINAL DISCHARGE OR ITCHING   Items with * indicate a potential emergency and should be followed up as soon as possible or go to the Emergency Department if any problems should occur.  Please show the CHEMOTHERAPY ALERT CARD or IMMUNOTHERAPY  ALERT CARD at check-in to the Emergency Department and triage nurse. Should you have questions after your visit or need to cancel or reschedule your appointment, please contact [facility] - A DEPT OF [facility]  [phone] and follow the prompts.  Office hours are 8:00 a.m. to 4:30 p.m. Monday - Friday. Please note that voicemails left after 4:00 p.m. may not be returned until the following business day.  We are closed weekends and major holidays. You have access to a nurse at all times for urgent questions. Please call the main number to the clinic [phone] and follow the prompts.  For any non-urgent questions, you may also contact your provider using MyChart. We now offer e-Visits for anyone 69 and older to request care online for non-urgent symptoms. For details visit [email or web address].   Also download the MyChart app! Go to the app store, search "MyChart", open the app, select [place], and log in with your MyChart username and password.

## 2023-08-05 NOTE — Progress Notes (Signed)
 Remove Dexamethasone  orders per MD.  Elodia Hailstone, RPH, BCPS, BCOP 08/05/2023 8:43 AM

## 2023-08-10 ENCOUNTER — Telehealth (INDEPENDENT_AMBULATORY_CARE_PROVIDER_SITE_OTHER): Payer: Self-pay | Admitting: Otolaryngology

## 2023-08-10 LAB — PROTEIN ELECTROPHORESIS, SERUM, WITH REFLEX
A/G Ratio: 1.1 (ref 0.7–1.7)
Albumin ELP: 3.7 g/dL (ref 2.9–4.4)
Alpha-1-Globulin: 0.2 g/dL (ref 0.0–0.4)
Alpha-2-Globulin: 0.7 g/dL (ref 0.4–1.0)
Beta Globulin: 0.8 g/dL (ref 0.7–1.3)
Gamma Globulin: 1.5 g/dL (ref 0.4–1.8)
Globulin, Total: 3.3 g/dL (ref 2.2–3.9)
M-Spike, %: 1 g/dL — ABNORMAL HIGH
SPEP Interpretation: 0
Total Protein ELP: 7 g/dL (ref 6.0–8.5)

## 2023-08-10 LAB — IMMUNOFIXATION REFLEX, SERUM
IgA: 23 mg/dL — ABNORMAL LOW (ref 61–437)
IgG (Immunoglobin G), Serum: 1771 mg/dL — ABNORMAL HIGH (ref 603–1613)
IgM (Immunoglobulin M), Srm: 21 mg/dL (ref 15–143)

## 2023-08-10 NOTE — Telephone Encounter (Signed)
Confirmed appt and address with patient for 08/11/2023.

## 2023-08-11 ENCOUNTER — Other Ambulatory Visit: Payer: Self-pay

## 2023-08-11 ENCOUNTER — Ambulatory Visit (INDEPENDENT_AMBULATORY_CARE_PROVIDER_SITE_OTHER): Payer: Medicare Other | Admitting: Otolaryngology

## 2023-08-11 DIAGNOSIS — H6123 Impacted cerumen, bilateral: Secondary | ICD-10-CM | POA: Insufficient documentation

## 2023-08-11 NOTE — Progress Notes (Signed)
Patient ID: [name], male   DOB: 01/28/1946, 78 y.o.   MRN: [id number]  Follow-up: Recurrent cerumen impaction  Procedure: Bilateral cerumen disimpaction.    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    Description: The patient is placed supine on the exam table.  Under the operating microscope, the right ear canal is examined and is noted to be completely impacted with cerumen.  The cerumen is carefully removed with a combination of suction catheters, cerumen curette, and alligator forceps.  After the cerumen removal, the ear canal and tympanic membrane are noted to be normal.  No middle ear effusion is noted.  The same procedure is then repeated on the left side without exception. The patient tolerated the procedure well.  Follow-up care:  The patient is instructed not to use Q-tips to clean the ear canals.  The patient will follow up in 9 months.

## 2023-08-12 ENCOUNTER — Other Ambulatory Visit: Payer: Self-pay

## 2023-08-18 ENCOUNTER — Encounter: Payer: Self-pay | Admitting: Hematology & Oncology

## 2023-08-18 ENCOUNTER — Inpatient Hospital Stay (HOSPITAL_BASED_OUTPATIENT_CLINIC_OR_DEPARTMENT_OTHER): Payer: Medicare Other | Admitting: Hematology & Oncology

## 2023-08-18 ENCOUNTER — Inpatient Hospital Stay: Payer: Medicare Other

## 2023-08-18 ENCOUNTER — Inpatient Hospital Stay: Payer: Medicare Other | Admitting: Licensed Clinical Social Worker

## 2023-08-18 VITALS — BP 131/63 | HR 65 | Temp 98.2°F | Resp 20 | Ht 69.0 in | Wt 176.1 lb

## 2023-08-18 DIAGNOSIS — C9000 Multiple myeloma not having achieved remission: Secondary | ICD-10-CM

## 2023-08-18 DIAGNOSIS — Z5112 Encounter for antineoplastic immunotherapy: Secondary | ICD-10-CM | POA: Diagnosis not present

## 2023-08-18 LAB — CBC WITH DIFFERENTIAL (CANCER CENTER ONLY)
Abs Immature Granulocytes: 0.01 10*3/uL (ref 0.00–0.07)
Basophils Absolute: 0 10*3/uL (ref 0.0–0.1)
Basophils Relative: 1 %
Eosinophils Absolute: 0.7 10*3/uL — ABNORMAL HIGH (ref 0.0–0.5)
Eosinophils Relative: 19 %
HCT: 30.9 % — ABNORMAL LOW (ref 39.0–52.0)
Hemoglobin: 10.3 g/dL — ABNORMAL LOW (ref 13.0–17.0)
Immature Granulocytes: 0 %
Lymphocytes Relative: 20 %
Lymphs Abs: 0.8 10*3/uL (ref 0.7–4.0)
MCH: 28 pg (ref 26.0–34.0)
MCHC: 33.3 g/dL (ref 30.0–36.0)
MCV: 84 fL (ref 80.0–100.0)
Monocytes Absolute: 0.4 10*3/uL (ref 0.1–1.0)
Monocytes Relative: 9 %
Neutro Abs: 2 10*3/uL (ref 1.7–7.7)
Neutrophils Relative %: 51 %
Platelet Count: 125 10*3/uL — ABNORMAL LOW (ref 150–400)
RBC: 3.68 MIL/uL — ABNORMAL LOW (ref 4.22–5.81)
RDW: 13.5 % (ref 11.5–15.5)
WBC Count: 3.8 10*3/uL — ABNORMAL LOW (ref 4.0–10.5)
nRBC: 0 % (ref 0.0–0.2)

## 2023-08-18 LAB — CMP (CANCER CENTER ONLY)
ALT: 15 U/L (ref 0–44)
AST: 15 U/L (ref 15–41)
Albumin: 3.9 g/dL (ref 3.5–5.0)
Alkaline Phosphatase: 49 U/L (ref 38–126)
Anion gap: 9 (ref 5–15)
BUN: 22 mg/dL (ref 8–23)
CO2: 27 mmol/L (ref 22–32)
Calcium: 9.5 mg/dL (ref 8.9–10.3)
Chloride: 99 mmol/L (ref 98–111)
Creatinine: 1.07 mg/dL (ref 0.61–1.24)
GFR, Estimated: >60 mL/min (ref >60)
Glucose, Bld: 178 mg/dL — ABNORMAL HIGH (ref 70–99)
Potassium: 3.7 mmol/L (ref 3.5–5.1)
Sodium: 135 mmol/L (ref 135–145)
Total Bilirubin: 0.4 mg/dL (ref 0.0–1.2)
Total Protein: 7 g/dL (ref 6.5–8.1)

## 2023-08-18 LAB — LACTATE DEHYDROGENASE: LDH: 132 U/L (ref 98–192)

## 2023-08-18 MED ORDER — DIPHENHYDRAMINE HCL 25 MG PO CAPS
50.0000 mg | ORAL_CAPSULE | Freq: Once | ORAL | Status: DC
Start: 2023-08-18 — End: 2023-08-18

## 2023-08-18 MED ORDER — ACETAMINOPHEN 325 MG PO TABS: 650.0000 mg | ORAL_TABLET | Freq: Once | ORAL | Status: DC

## 2023-08-18 MED ORDER — DARATUMUMAB-HYALURONIDASE-FIHJ 1800-30000 MG-UT/15ML ~~LOC~~ SOLN
1800.0000 mg | Freq: Once | SUBCUTANEOUS | Status: AC
  Administered 2023-08-18: 1800 mg via SUBCUTANEOUS
  Filled 2023-08-18: qty 15

## 2023-08-18 MED ORDER — BORTEZOMIB CHEMO SQ INJECTION 3.5 MG (2.5MG/ML)
1.3000 mg/m2 | Freq: Once | INTRAMUSCULAR | Status: AC
  Administered 2023-08-18: 2.5 mg via SUBCUTANEOUS
  Filled 2023-08-18: qty 1

## 2023-08-18 NOTE — Progress Notes (Signed)
Hematology and Oncology Follow Up Visit  [name] [id number] September 23, 1945 78 y.o. 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 Bouse is here today for follow-up.  So far, he is doing okay.  The big news is that he is going out to the [place] this weekend.  He will be gone for a week.  I am so glad that he will be able to go down.  He is doing well.  He will be off the Revlimid when he goes down.  As far as his myeloma goes, he is doing well.  His monoclonal spike was 1 g/dL.  His IgG level was 1820 mg/dL.  His Kappa light chain was 10.4 mg/dL.  He has had no problems with nausea or vomiting.  He recently had the flu.  He is on Tamiflu and Z-Pak.  He has had no bleeding.  He has had no rashes.  Has had no leg swelling.  His biggest problem clearly this is diabetes.  He is going to have to go on insulin.  Hopefully, he will be able to go on a insulin pump.  Overall, I would say his performance status is ECOG 1.      Wt Readings from Last 3 Encounters:  08/18/23 176 lb 1.9 oz (79.9 kg)  08/04/23 179 lb (81.2 kg)  07/29/23 178 lb (80.7 kg)    Medications:  Allergies as of 08/18/2023       Reactions   Clindamycin Hcl Itching   Famvir [famciclovir] Itching   Metoprolol Succinate Other (See Comments)   Pt does not recall.        Medication List        Accurate as of August 18, 2023  8:22 AM. If you have any questions, ask your nurse or doctor.          amLODipine 10 MG tablet Commonly known as: NORVASC Take 10 mg by mouth every morning.   aspirin EC 325 MG tablet Take 325 mg by mouth daily.   ezetimibe 10 MG  tablet Commonly known as: ZETIA Take 10 mg by mouth daily.   furosemide 20 MG tablet Commonly known as: LASIX Take 20 mg by mouth daily.   glipiZIDE 2.5 MG 24 hr tablet Commonly known as: GLUCOTROL XL Take 2.5 mg by mouth daily.   lenalidomide 15 MG capsule Commonly known as: REVLIMID Take 1 capsule (15 mg total) by mouth daily. Take for 21 days, then 7 days off. Repeat every 28 days. [employer] Auth # [id number]   metFORMIN 500 MG 24 hr tablet Commonly known as: GLUCOPHAGE-XR Take 4 tablets by mouth daily.   Mounjaro 5 MG/0.5ML Pen Generic drug: tirzepatide Inject 2.5 mg into the skin once a week.   omeprazole 20 MG capsule Commonly known as: PRILOSEC Take 20 mg by mouth 2 (two) times daily before a meal.   ondansetron 8 MG tablet Commonly known as: Zofran Take 1 tablet (8 mg total) by mouth every 8 (eight) hours as needed for nausea or vomiting.   prochlorperazine 10 MG tablet Commonly known as: COMPAZINE Take 1 tablet (10 mg total) by mouth  every 6 (six) hours as needed for nausea or vomiting.   rosuvastatin 40 MG tablet Commonly known as: CRESTOR Take 40 mg by mouth daily.   Testosterone 25 MG/2.5GM (1%) Gel Place 1 mg onto the skin every other day.   triamterene-hydrochlorothiazide 37.5-25 MG tablet Commonly known as: MAXZIDE-25 Take 1 tablet by mouth every morning.   valsartan 320 MG tablet Commonly known as: DIOVAN Take 320 mg by mouth daily.        Allergies:  Allergies  Allergen Reactions   Clindamycin Hcl Itching   Famvir [Famciclovir] Itching   Metoprolol Succinate Other (See Comments)    Pt does not recall.    Past Medical History, Surgical history, Social history, and Family History were reviewed and updated.  Review of Systems: Review of Systems  Constitutional: Negative.   HENT: Negative.    Eyes: Negative.   Respiratory: Negative.    Cardiovascular: Negative.   Gastrointestinal: Negative.   Genitourinary: Negative.    Musculoskeletal: Negative.   Skin: Negative.   Neurological: Negative.   Endo/Heme/Allergies: Negative.   Psychiatric/Behavioral: Negative.        Physical Exam:  height is 5\' 9"  (1.753 m) and weight is 176 lb 1.9 oz (79.9 kg). His oral temperature is 98.2 F (36.8 C). His blood pressure is 131/63 and his pulse is 65. His respiration is 20 and oxygen saturation is 100%.    Wt Readings from Last 3 Encounters:  08/18/23 176 lb 1.9 oz (79.9 kg)  08/04/23 179 lb (81.2 kg)  07/29/23 178 lb (80.7 kg)    Physical Exam Vitals reviewed.  HENT:     Head: Normocephalic and atraumatic.  Eyes:     Pupils: Pupils are equal, round, and reactive to light.  Cardiovascular:     Rate and Rhythm: Normal rate and regular rhythm.     Heart sounds: Normal heart sounds.  Pulmonary:     Effort: Pulmonary effort is normal.     Breath sounds: Normal breath sounds.  Abdominal:     General: Bowel sounds are normal.     Palpations: Abdomen is soft.  Musculoskeletal:        General: No tenderness or deformity. Normal range of motion.     Cervical back: Normal range of motion.  Lymphadenopathy:     Cervical: No cervical adenopathy.  Skin:    General: Skin is warm and dry.     Findings: No erythema or rash.  Neurological:     Mental Status: He is alert and oriented to person, place, and time.  Psychiatric:        Behavior: Behavior normal.        Thought Content: Thought content normal.        Judgment: Judgment normal.      Lab Results  Component Value Date   WBC 3.8 (L) 08/18/2023   HGB 10.3 (L) 08/18/2023   HCT 30.9 (L) 08/18/2023   MCV 84.0 08/18/2023   PLT 125 (L) 08/18/2023   Lab Results  Component Value Date   FERRITIN 42 05/20/2023   IRON 112 05/20/2023   TIBC 430 05/20/2023   UIBC 318 05/20/2023   IRONPCTSAT 26 05/20/2023   Lab Results  Component Value Date   RETICCTPCT 2.3 08/31/2022   RBC 3.68 (L) 08/18/2023   Lab Results  Component Value Date   KPAFRELGTCHN  104.9 (H) 07/29/2023   LAMBDASER 9.0 07/29/2023   KAPLAMBRATIO 11.66 (H) 07/29/2023   Lab Results  Component Value Date   IGGSERUM  1,865 (H) 07/29/2023   IGGSERUM 1,771 (H) 07/29/2023   IGA 27 (L) 07/29/2023   IGA 23 (L) 07/29/2023   IGMSERUM 20 07/29/2023   IGMSERUM 21 07/29/2023   Lab Results  Component Value Date   TOTALPROTELP 7.0 07/29/2023   ALBUMINELP 3.7 07/29/2023   A1GS 0.2 07/29/2023   A2GS 0.7 07/29/2023   BETS 0.8 07/29/2023   GAMS 1.5 07/29/2023   MSPIKE 1.0 (H) 07/29/2023   SPEI Comment 05/20/2023     Chemistry      Component Value Date/Time   NA 136 08/05/2023 0747   NA 139 06/20/2020 0923   K 3.8 08/05/2023 0747   CL 102 08/05/2023 0747   CO2 26 08/05/2023 0747   BUN 15 08/05/2023 0747   BUN 17 06/20/2020 0923   CREATININE 1.17 08/05/2023 0747   CREATININE 1.12 09/03/2015 1201      Component Value Date/Time   CALCIUM 9.5 08/05/2023 0747   ALKPHOS 52 08/05/2023 0747   AST 20 08/05/2023 0747   ALT 14 08/05/2023 0747   BILITOT 0.3 08/05/2023 0747       Impression and Plan: Mr. Doughten is a very pleasant 78 yo caucasian gentleman with IgG kappa multiple myeloma.  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  His FISH panel was negative. Currently he is on daratumumab/Velcade/Revlimid without Decadron.   Hopefully, we will see that his monoclonal studies improving.  Again, this is all about quality of life.    Maybe, his blood sugars will get better.  I will plan to get him back to see [place] in another 3 weeks.  I told him not to drink water down in the [place].  He needs to have bottled water.  I was told to make sure he wears sunscreen.    Josph Macho, MD 2/19/20258:22 AM

## 2023-08-18 NOTE — Patient Instructions (Signed)
 [facility] - A DEPT OF [facility]  Discharge Instructions: Thank you for choosing [facility] to provide your oncology and hematology care.   If you have a lab appointment with the Cancer Center, please go directly to the Cancer Center and check in at the registration area.  Wear comfortable clothing and clothing appropriate for easy access to any Portacath or PICC line.   We strive to give you quality time with your provider. You may need to reschedule your appointment if you arrive late (15 or more minutes).  Arriving late affects you and other patients whose appointments are after yours.  Also, if you miss three or more appointments without notifying the office, you may be dismissed from the clinic at the provider's discretion.      For prescription refill requests, have your pharmacy contact our office and allow 72 hours for refills to be completed.    Today you received the following chemotherapy and/or immunotherapy agents Velcade/Darzalex      To help prevent nausea and vomiting after your treatment, we encourage you to take your nausea medication as directed.  BELOW ARE SYMPTOMS THAT SHOULD BE REPORTED IMMEDIATELY: *FEVER GREATER THAN 100.4 F (38 C) OR HIGHER *CHILLS OR SWEATING *NAUSEA AND VOMITING THAT IS NOT CONTROLLED WITH YOUR NAUSEA MEDICATION *UNUSUAL SHORTNESS OF BREATH *UNUSUAL BRUISING OR BLEEDING *URINARY PROBLEMS (pain or burning when urinating, or frequent urination) *BOWEL PROBLEMS (unusual diarrhea, constipation, pain near the anus) TENDERNESS IN MOUTH AND THROAT WITH OR WITHOUT PRESENCE OF ULCERS (sore throat, sores in mouth, or a toothache) UNUSUAL RASH, SWELLING OR PAIN  UNUSUAL VAGINAL DISCHARGE OR ITCHING   Items with * indicate a potential emergency and should be followed up as soon as possible or go to the Emergency Department if any problems should occur.  Please show the CHEMOTHERAPY ALERT CARD or  IMMUNOTHERAPY ALERT CARD at check-in to the Emergency Department and triage nurse. Should you have questions after your visit or need to cancel or reschedule your appointment, please contact [facility] - A DEPT OF [facility]  [phone] and follow the prompts.  Office hours are 8:00 a.m. to 4:30 p.m. Monday - Friday. Please note that voicemails left after 4:00 p.m. may not be returned until the following business day.  We are closed weekends and major holidays. You have access to a nurse at all times for urgent questions. Please call the main number to the clinic [phone] and follow the prompts.  For any non-urgent questions, you may also contact your provider using MyChart. We now offer e-Visits for anyone 71 and older to request care online for non-urgent symptoms. For details visit [email or web address].   Also download the MyChart app! Go to the app store, search "MyChart", open the app, select , and log in with your MyChart username and password.

## 2023-08-18 NOTE — Progress Notes (Signed)
[facility] Clinical Social Work  Clinical Social Work met with patient in infusion for assessment of psychosocial needs.  Clinical Social Worker offered support and assessed for needs.    Patient going on vacation next week and is looking forward to it.  Patient stated he has no needs at this time.     Dorothey Baseman, LCSW  Clinical Social Worker [facility]

## 2023-08-19 ENCOUNTER — Encounter: Payer: Self-pay | Admitting: *Deleted

## 2023-08-19 LAB — IGG, IGA, IGM
IgA: 26 mg/dL — ABNORMAL LOW (ref 61–437)
IgG (Immunoglobin G), Serum: 1810 mg/dL — ABNORMAL HIGH (ref 603–1613)
IgM (Immunoglobulin M), Srm: 17 mg/dL (ref 15–143)

## 2023-08-19 LAB — KAPPA/LAMBDA LIGHT CHAINS
Kappa free light chain: 93 mg/L — ABNORMAL HIGH (ref 3.3–19.4)
Kappa, lambda light chain ratio: 10.11 — ABNORMAL HIGH (ref 0.26–1.65)
Lambda free light chains: 9.2 mg/L (ref 5.7–26.3)

## 2023-08-24 ENCOUNTER — Other Ambulatory Visit: Payer: Self-pay

## 2023-08-24 DIAGNOSIS — C9000 Multiple myeloma not having achieved remission: Secondary | ICD-10-CM

## 2023-08-24 MED ORDER — LENALIDOMIDE 15 MG PO CAPS: 15.0000 mg | ORAL_CAPSULE | Freq: Every day | ORAL | 0 refills | Status: DC

## 2023-08-25 ENCOUNTER — Inpatient Hospital Stay: Payer: Medicare Other

## 2023-08-26 LAB — PROTEIN ELECTROPHORESIS, SERUM, WITH REFLEX
A/G Ratio: 0.9 (ref 0.7–1.7)
Albumin ELP: 3.3 g/dL (ref 2.9–4.4)
Alpha-1-Globulin: 0.3 g/dL (ref 0.0–0.4)
Alpha-2-Globulin: 0.8 g/dL (ref 0.4–1.0)
Beta Globulin: 0.8 g/dL (ref 0.7–1.3)
Gamma Globulin: 1.5 g/dL (ref 0.4–1.8)
Globulin, Total: 3.5 g/dL (ref 2.2–3.9)
M-Spike, %: 1.1 g/dL — ABNORMAL HIGH
SPEP Interpretation: 0
Total Protein ELP: 6.8 g/dL (ref 6.0–8.5)

## 2023-08-26 LAB — IMMUNOFIXATION REFLEX, SERUM
IgA: 27 mg/dL — ABNORMAL LOW (ref 61–437)
IgG (Immunoglobin G), Serum: 1808 mg/dL — ABNORMAL HIGH (ref 603–1613)
IgM (Immunoglobulin M), Srm: 17 mg/dL (ref 15–143)

## 2023-08-27 ENCOUNTER — Other Ambulatory Visit: Payer: Self-pay | Admitting: *Deleted

## 2023-08-27 DIAGNOSIS — C9000 Multiple myeloma not having achieved remission: Secondary | ICD-10-CM

## 2023-08-27 MED ORDER — LENALIDOMIDE 15 MG PO CAPS: 15.0000 mg | ORAL_CAPSULE | Freq: Every day | ORAL | 0 refills | Status: DC

## 2023-09-08 ENCOUNTER — Inpatient Hospital Stay: Payer: Medicare Other

## 2023-09-08 ENCOUNTER — Encounter: Payer: Self-pay | Admitting: Hematology & Oncology

## 2023-09-08 ENCOUNTER — Inpatient Hospital Stay: Payer: Medicare Other | Attending: Hematology & Oncology | Admitting: Hematology & Oncology

## 2023-09-08 VITALS — BP 132/58 | HR 64 | Temp 97.9°F | Resp 20 | Ht 69.0 in | Wt 176.0 lb

## 2023-09-08 DIAGNOSIS — D509 Iron deficiency anemia, unspecified: Secondary | ICD-10-CM | POA: Insufficient documentation

## 2023-09-08 DIAGNOSIS — C9000 Multiple myeloma not having achieved remission: Secondary | ICD-10-CM | POA: Insufficient documentation

## 2023-09-08 DIAGNOSIS — Q999 Chromosomal abnormality, unspecified: Secondary | ICD-10-CM | POA: Diagnosis not present

## 2023-09-08 DIAGNOSIS — Z7961 Long term (current) use of immunomodulator: Secondary | ICD-10-CM | POA: Insufficient documentation

## 2023-09-08 DIAGNOSIS — Z5112 Encounter for antineoplastic immunotherapy: Secondary | ICD-10-CM | POA: Diagnosis present

## 2023-09-08 DIAGNOSIS — Z79899 Other long term (current) drug therapy: Secondary | ICD-10-CM | POA: Insufficient documentation

## 2023-09-08 LAB — CBC WITH DIFFERENTIAL (CANCER CENTER ONLY)
Abs Immature Granulocytes: 0.01 10*3/uL (ref 0.00–0.07)
Basophils Absolute: 0 10*3/uL (ref 0.0–0.1)
Basophils Relative: 0 %
Eosinophils Absolute: 0.1 10*3/uL (ref 0.0–0.5)
Eosinophils Relative: 3 %
HCT: 33.6 % — ABNORMAL LOW (ref 39.0–52.0)
Hemoglobin: 10.7 g/dL — ABNORMAL LOW (ref 13.0–17.0)
Immature Granulocytes: 0 %
Lymphocytes Relative: 27 %
Lymphs Abs: 1.2 10*3/uL (ref 0.7–4.0)
MCH: 26.8 pg (ref 26.0–34.0)
MCHC: 31.8 g/dL (ref 30.0–36.0)
MCV: 84.2 fL (ref 80.0–100.0)
Monocytes Absolute: 0.4 10*3/uL (ref 0.1–1.0)
Monocytes Relative: 9 %
Neutro Abs: 2.7 10*3/uL (ref 1.7–7.7)
Neutrophils Relative %: 61 %
Platelet Count: 177 10*3/uL (ref 150–400)
RBC: 3.99 MIL/uL — ABNORMAL LOW (ref 4.22–5.81)
RDW: 14.1 % (ref 11.5–15.5)
WBC Count: 4.5 10*3/uL (ref 4.0–10.5)
nRBC: 0 % (ref 0.0–0.2)

## 2023-09-08 LAB — CMP (CANCER CENTER ONLY)
ALT: 20 U/L (ref 0–44)
AST: 25 U/L (ref 15–41)
Albumin: 4.2 g/dL (ref 3.5–5.0)
Alkaline Phosphatase: 58 U/L (ref 38–126)
Anion gap: 11 (ref 5–15)
BUN: 18 mg/dL (ref 8–23)
CO2: 26 mmol/L (ref 22–32)
Calcium: 9.2 mg/dL (ref 8.9–10.3)
Chloride: 102 mmol/L (ref 98–111)
Creatinine: 0.98 mg/dL (ref 0.61–1.24)
GFR, Estimated: >60 mL/min (ref >60)
Glucose, Bld: 250 mg/dL — ABNORMAL HIGH (ref 70–99)
Potassium: 3.4 mmol/L — ABNORMAL LOW (ref 3.5–5.1)
Sodium: 139 mmol/L (ref 135–145)
Total Bilirubin: 0.4 mg/dL (ref 0.0–1.2)
Total Protein: 7.4 g/dL (ref 6.5–8.1)

## 2023-09-08 LAB — LACTATE DEHYDROGENASE: LDH: 144 U/L (ref 98–192)

## 2023-09-08 MED ORDER — ACETAMINOPHEN 325 MG PO TABS
650.0000 mg | ORAL_TABLET | Freq: Once | ORAL | Status: AC
  Administered 2023-09-08: 650 mg via ORAL
  Filled 2023-09-08: qty 2

## 2023-09-08 MED ORDER — DIPHENHYDRAMINE HCL 25 MG PO CAPS
50.0000 mg | ORAL_CAPSULE | Freq: Once | ORAL | Status: AC
  Administered 2023-09-08: 50 mg via ORAL
  Filled 2023-09-08: qty 2

## 2023-09-08 MED ORDER — BORTEZOMIB CHEMO SQ INJECTION 3.5 MG (2.5MG/ML)
1.3000 mg/m2 | Freq: Once | INTRAMUSCULAR | Status: AC
  Administered 2023-09-08: 2.5 mg via SUBCUTANEOUS
  Filled 2023-09-08: qty 1

## 2023-09-08 MED ORDER — DARATUMUMAB-HYALURONIDASE-FIHJ 1800-30000 MG-UT/15ML ~~LOC~~ SOLN
1800.0000 mg | Freq: Once | SUBCUTANEOUS | Status: AC
  Administered 2023-09-08: 1800 mg via SUBCUTANEOUS
  Filled 2023-09-08: qty 15

## 2023-09-08 NOTE — Progress Notes (Signed)
 Hematology and Oncology Follow Up Visit  [name] [id number] [birth year] 78 y.o. 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  He had a wonderful time down in the [place].  He enjoyed himself down there.  He was very liberal with the sunscreen.  He recently saw his cardiologist.  Everything turned out okay.  Unfortunately, his cardiologist will retire.  Only last saw him, his monoclonal spike was 1.1 g/dL.  The IgG level was 1800 mg/dL.  The Kappa light chain was 9.3 mg/dL.  He has had no problems with pain.  There is been no change in bowel or bladder habits.  He has had no rashes.  There is been no leg swelling.  He has had no cough.  He has had no mouth sores.  He has had no headache.  Overall, I would say that his performance status is probably ECOG 1.      Wt Readings from Last 3 Encounters:  09/08/23 176 lb (79.8 kg)  08/18/23 176 lb 1.9 oz (79.9 kg)  08/04/23 179 lb (81.2 kg)    Medications:  Allergies as of 09/08/2023       Reactions   Clindamycin Hcl Itching   Famvir [famciclovir] Itching   Metoprolol Succinate Other (See Comments)   Pt does not recall.        Medication List        Accurate as of September 08, 2023 11:05 AM. If you have any questions, ask your nurse or doctor.          STOP taking these medications    metFORMIN 500 MG 24 hr tablet Commonly known as: GLUCOPHAGE-XR Stopped by: [name]       TAKE these medications    amLODipine 10 MG tablet Commonly known as: NORVASC Take 10 mg by mouth every morning.   aspirin EC 325 MG tablet Take 325 mg by  mouth daily.   ezetimibe 10 MG tablet Commonly known as: ZETIA Take 10 mg by mouth daily.   furosemide 20 MG tablet Commonly known as: LASIX Take 20 mg by mouth daily.   glipiZIDE 2.5 MG 24 hr tablet Commonly known as: GLUCOTROL XL Take 2.5 mg by mouth daily.   Jardiance 25 MG Tabs tablet Generic drug: empagliflozin Take 25 mg by mouth daily.   lenalidomide 15 MG capsule Commonly known as: REVLIMID Take 1 capsule (15 mg total) by mouth daily. Take for 21 days, then 7 days off. Repeat every 28 days. [employer] Auth # [id number]   Mounjaro 5 MG/0.5ML Pen Generic drug: tirzepatide Inject 2.5 mg into the skin once a week.   omeprazole 20 MG capsule Commonly known as: PRILOSEC Take 20 mg by mouth 2 (two) times daily before a meal.   ondansetron 8 MG tablet Commonly known as: Zofran Take 1 tablet (8 mg total) by mouth every 8 (eight) hours as needed for nausea or vomiting.   prochlorperazine 10 MG tablet Commonly known as: COMPAZINE Take 1 tablet (10 mg total) by mouth every 6 (six) hours as needed for nausea or vomiting.  rosuvastatin 40 MG tablet Commonly known as: CRESTOR Take 40 mg by mouth daily.   Testosterone 25 MG/2.5GM (1%) Gel Place 1 mg onto the skin every other day.   triamterene-hydrochlorothiazide 37.5-25 MG tablet Commonly known as: MAXZIDE-25 Take 1 tablet by mouth every morning.   valsartan 320 MG tablet Commonly known as: DIOVAN Take 320 mg by mouth daily.        Allergies:  Allergies  Allergen Reactions   Clindamycin Hcl Itching   Famvir [Famciclovir] Itching   Metoprolol Succinate Other (See Comments)    Pt does not recall.    Past Medical History, Surgical history, Social history, and Family History were reviewed and updated.  Review of Systems: Review of Systems  Constitutional: Negative.   HENT: Negative.    Eyes: Negative.   Respiratory: Negative.    Cardiovascular: Negative.   Gastrointestinal: Negative.   Genitourinary:  Negative.   Musculoskeletal: Negative.   Skin: Negative.   Neurological: Negative.   Endo/Heme/Allergies: Negative.   Psychiatric/Behavioral: Negative.        Physical Exam:  height is 5\' 9"  (1.753 m) and weight is 176 lb (79.8 kg). His oral temperature is 97.9 F (36.6 C). His blood pressure is 132/58 (abnormal) and his pulse is 64. His respiration is 20 and oxygen saturation is 98%.    Wt Readings from Last 3 Encounters:  09/08/23 176 lb (79.8 kg)  08/18/23 176 lb 1.9 oz (79.9 kg)  08/04/23 179 lb (81.2 kg)    Physical Exam Vitals reviewed.  HENT:     Head: Normocephalic and atraumatic.  Eyes:     Pupils: Pupils are equal, round, and reactive to light.  Cardiovascular:     Rate and Rhythm: Normal rate and regular rhythm.     Heart sounds: Normal heart sounds.  Pulmonary:     Effort: Pulmonary effort is normal.     Breath sounds: Normal breath sounds.  Abdominal:     General: Bowel sounds are normal.     Palpations: Abdomen is soft.  Musculoskeletal:        General: No tenderness or deformity. Normal range of motion.     Cervical back: Normal range of motion.  Lymphadenopathy:     Cervical: No cervical adenopathy.  Skin:    General: Skin is warm and dry.     Findings: No erythema or rash.  Neurological:     Mental Status: He is alert and oriented to person, place, and time.  Psychiatric:        Behavior: Behavior normal.        Thought Content: Thought content normal.        Judgment: Judgment normal.      Lab Results  Component Value Date   WBC 4.5 09/08/2023   HGB 10.7 (L) 09/08/2023   HCT 33.6 (L) 09/08/2023   MCV 84.2 09/08/2023   PLT 177 09/08/2023   Lab Results  Component Value Date   FERRITIN 42 05/20/2023   IRON 112 05/20/2023   TIBC 430 05/20/2023   UIBC 318 05/20/2023   IRONPCTSAT 26 05/20/2023   Lab Results  Component Value Date   RETICCTPCT 2.3 08/31/2022   RBC 3.99 (L) 09/08/2023   Lab Results  Component Value Date    KPAFRELGTCHN 93.0 (H) 08/18/2023   LAMBDASER 9.2 08/18/2023   KAPLAMBRATIO 10.11 (H) 08/18/2023   Lab Results  Component Value Date   IGGSERUM 1,810 (H) 08/18/2023   IGGSERUM 1,808 (H) 08/18/2023   IGA 26 (L) 08/18/2023  IGA 27 (L) 08/18/2023   IGMSERUM 17 08/18/2023   IGMSERUM 17 08/18/2023   Lab Results  Component Value Date   TOTALPROTELP 6.8 08/18/2023   ALBUMINELP 3.3 08/18/2023   A1GS 0.3 08/18/2023   A2GS 0.8 08/18/2023   BETS 0.8 08/18/2023   GAMS 1.5 08/18/2023   MSPIKE 1.1 (H) 08/18/2023   SPEI Comment 05/20/2023     Chemistry      Component Value Date/Time   NA 139 09/08/2023 1002   NA 139 06/20/2020 0923   K 3.4 (L) 09/08/2023 1002   CL 102 09/08/2023 1002   CO2 26 09/08/2023 1002   BUN 18 09/08/2023 1002   BUN 17 06/20/2020 0923   CREATININE 0.98 09/08/2023 1002   CREATININE 1.12 09/03/2015 1201      Component Value Date/Time   CALCIUM 9.2 09/08/2023 1002   ALKPHOS 58 09/08/2023 1002   AST 25 09/08/2023 1002   ALT 20 09/08/2023 1002   BILITOT 0.4 09/08/2023 1002       Impression and Plan: Mr. Hutmacher is a very pleasant 78 yo caucasian gentleman with IgG kappa multiple myeloma.  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  His FISH panel was negative. Currently he is on daratumumab/Velcade/Revlimid without Decadron.   From my point of view, everything is holding nice and steady.  His blood sugars will continue to be his biggest problem.  He said he had his medications readjusted.  I think he was put on Jardiance and taken off metformin.  We will go ahead with his seventh chemotherapy.  We will then plan to get him back in 3 weeks.  Hopefully, we will be able to make an adjustment and just have him on some kind of maintenance once a month.  Hopefully, we will see that his monoclonal studies improving.  Again, this is all about quality of life.    Maybe, his blood sugars will get better.  I will plan to get him back to see [place] in another 3  weeks.  I told him not to drink water down in the [place].  He needs to have bottled water.  I was told to make sure he wears sunscreen.    Bob Macho, MD 3/12/202511:05 AM

## 2023-09-09 LAB — KAPPA/LAMBDA LIGHT CHAINS
Kappa free light chain: 107.4 mg/L — ABNORMAL HIGH (ref 3.3–19.4)
Kappa, lambda light chain ratio: 16.27 — ABNORMAL HIGH (ref 0.26–1.65)
Lambda free light chains: 6.6 mg/L (ref 5.7–26.3)

## 2023-09-09 LAB — IGG, IGA, IGM
IgA: 24 mg/dL — ABNORMAL LOW (ref 61–437)
IgG (Immunoglobin G), Serum: 1893 mg/dL — ABNORMAL HIGH (ref 603–1613)
IgM (Immunoglobulin M), Srm: 19 mg/dL (ref 15–143)

## 2023-09-13 LAB — PROTEIN ELECTROPHORESIS, SERUM, WITH REFLEX
A/G Ratio: 0.9 (ref 0.7–1.7)
Albumin ELP: 3.5 g/dL (ref 2.9–4.4)
Alpha-1-Globulin: 0.2 g/dL (ref 0.0–0.4)
Alpha-2-Globulin: 0.8 g/dL (ref 0.4–1.0)
Beta Globulin: 1 g/dL (ref 0.7–1.3)
Gamma Globulin: 1.7 g/dL (ref 0.4–1.8)
Globulin, Total: 3.7 g/dL (ref 2.2–3.9)
M-Spike, %: 1.3 g/dL — ABNORMAL HIGH
SPEP Interpretation: 0
Total Protein ELP: 7.2 g/dL (ref 6.0–8.5)

## 2023-09-13 LAB — IMMUNOFIXATION REFLEX, SERUM
IgA: 24 mg/dL — ABNORMAL LOW (ref 61–437)
IgG (Immunoglobin G), Serum: 1925 mg/dL — ABNORMAL HIGH (ref 603–1613)
IgM (Immunoglobulin M), Srm: 19 mg/dL (ref 15–143)

## 2023-09-15 ENCOUNTER — Inpatient Hospital Stay: Payer: Medicare Other

## 2023-09-15 VITALS — BP 126/85 | HR 71 | Temp 98.7°F | Resp 18

## 2023-09-15 DIAGNOSIS — Z5112 Encounter for antineoplastic immunotherapy: Secondary | ICD-10-CM | POA: Diagnosis not present

## 2023-09-15 DIAGNOSIS — C9000 Multiple myeloma not having achieved remission: Secondary | ICD-10-CM

## 2023-09-15 LAB — CBC WITH DIFFERENTIAL (CANCER CENTER ONLY)
Abs Immature Granulocytes: 0.01 10*3/uL (ref 0.00–0.07)
Basophils Absolute: 0 10*3/uL (ref 0.0–0.1)
Basophils Relative: 0 %
Eosinophils Absolute: 0.1 10*3/uL (ref 0.0–0.5)
Eosinophils Relative: 2 %
HCT: 33.9 % — ABNORMAL LOW (ref 39.0–52.0)
Hemoglobin: 10.8 g/dL — ABNORMAL LOW (ref 13.0–17.0)
Immature Granulocytes: 0 %
Lymphocytes Relative: 19 %
Lymphs Abs: 1.1 10*3/uL (ref 0.7–4.0)
MCH: 26.7 pg (ref 26.0–34.0)
MCHC: 31.9 g/dL (ref 30.0–36.0)
MCV: 83.7 fL (ref 80.0–100.0)
Monocytes Absolute: 0.4 10*3/uL (ref 0.1–1.0)
Monocytes Relative: 7 %
Neutro Abs: 4.1 10*3/uL (ref 1.7–7.7)
Neutrophils Relative %: 72 %
Platelet Count: 141 10*3/uL — ABNORMAL LOW (ref 150–400)
RBC: 4.05 MIL/uL — ABNORMAL LOW (ref 4.22–5.81)
RDW: 14.5 % (ref 11.5–15.5)
WBC Count: 5.7 10*3/uL (ref 4.0–10.5)
nRBC: 0 % (ref 0.0–0.2)

## 2023-09-15 LAB — CMP (CANCER CENTER ONLY)
ALT: 27 U/L (ref 0–44)
AST: 24 U/L (ref 15–41)
Albumin: 4.1 g/dL (ref 3.5–5.0)
Alkaline Phosphatase: 53 U/L (ref 38–126)
Anion gap: 7 (ref 5–15)
BUN: 19 mg/dL (ref 8–23)
CO2: 27 mmol/L (ref 22–32)
Calcium: 8.9 mg/dL (ref 8.9–10.3)
Chloride: 101 mmol/L (ref 98–111)
Creatinine: 1.08 mg/dL (ref 0.61–1.24)
GFR, Estimated: >60 mL/min (ref >60)
Glucose, Bld: 218 mg/dL — ABNORMAL HIGH (ref 70–99)
Potassium: 4.6 mmol/L (ref 3.5–5.1)
Sodium: 135 mmol/L (ref 135–145)
Total Bilirubin: 0.4 mg/dL (ref 0.0–1.2)
Total Protein: 7 g/dL (ref 6.5–8.1)

## 2023-09-15 MED ORDER — BORTEZOMIB CHEMO SQ INJECTION 3.5 MG (2.5MG/ML)
1.3000 mg/m2 | Freq: Once | INTRAMUSCULAR | Status: AC
  Administered 2023-09-15: 2.5 mg via SUBCUTANEOUS
  Filled 2023-09-15: qty 1

## 2023-09-15 NOTE — Patient Instructions (Signed)
 Bortezomib Injection What is this medication? BORTEZOMIB (bor TEZ oh mib) treats lymphoma. It may also be used to treat multiple myeloma, a type of bone marrow cancer. It works by blocking a protein that causes cancer cells to grow and multiply. This helps to slow or stop the spread of cancer cells. This medicine may be used for other purposes; ask your health care provider or pharmacist if you have questions. COMMON BRAND NAME(S): BORUZU, Velcade What should I tell my care team before I take this medication? They need to know if you have any of these conditions: Dehydration Diabetes Heart disease Liver disease Tingling of the fingers or toes or other nerve disorder An unusual or allergic reaction to bortezomib, other medications, foods, dyes, or preservatives If you or your partner are pregnant or trying to get pregnant Breastfeeding How should I use this medication? This medication is injected into a vein or under the skin. It is given by your care team in a hospital or clinic setting. Talk to your care team about the use of this medication in children. Special care may be needed. Overdosage: If you think you have taken too much of this medicine contact a poison control center or emergency room at once. NOTE: This medicine is only for you. Do not share this medicine with others. What if I miss a dose? Keep appointments for follow-up doses. It is important not to miss your dose. Call your care team if you are unable to keep an appointment. What may interact with this medication? Ketoconazole Rifampin This list may not describe all possible interactions. Give your health care provider a list of all the medicines, herbs, non-prescription drugs, or dietary supplements you use. Also tell them if you smoke, drink alcohol, or use illegal drugs. Some items may interact with your medicine. What should I watch for while using this medication? Your condition will be monitored carefully while you  are receiving this medication. You may need blood work while taking this medication. This medication may affect your coordination, reaction time, or judgment. Do not drive or operate machinery until you know how this medication affects you. Sit up or stand slowly to reduce the risk of dizzy or fainting spells. Drinking alcohol with this medication can increase the risk of these side effects. This medication may increase your risk of getting an infection. Call your care team for advice if you get a fever, chills, sore throat, or other symptoms of a cold or flu. Do not treat yourself. Try to avoid being around people who are sick. Check with your care team if you have severe diarrhea, nausea, and vomiting, or if you sweat a lot. The loss of too much body fluid may make it dangerous for you to take this medication. Talk to your care team if you may be pregnant. Serious birth defects can occur if you take this medication during pregnancy and for 7 months after the last dose. You will need a negative pregnancy test before starting this medication. Contraception is recommended while taking this medication and for 7 months after the last dose. Your care team can help you find the option that works for you. If your partner can get pregnant, use a condom during sex while taking this medication and for 4 months after the last dose. Do not breastfeed while taking this medication and for 2 months after the last dose. This medication may cause infertility. Talk to your care team if you are concerned about your fertility. What side  effects may I notice from receiving this medication? Side effects that you should report to your care team as soon as possible: Allergic reactions--skin rash, itching, hives, swelling of the face, lips, tongue, or throat Bleeding--bloody or black, tar-like stools, vomiting blood or brown material that looks like coffee grounds, red or dark brown urine, small red or purple spots on skin,  unusual bruising or bleeding Bleeding in the brain--severe headache, stiff neck, confusion, dizziness, change in vision, numbness or weakness of the face, arm, or leg, trouble speaking, trouble walking, vomiting Bowel blockage--stomach cramping, unable to have a bowel movement or pass gas, loss of appetite, vomiting Heart failure--shortness of breath, swelling of the ankles, feet, or hands, sudden weight gain, unusual weakness or fatigue Infection--fever, chills, cough, sore throat, wounds that don't heal, pain or trouble when passing urine, general feeling of discomfort or being unwell Liver injury--right upper belly pain, loss of appetite, nausea, light-colored stool, dark yellow or brown urine, yellowing skin or eyes, unusual weakness or fatigue Low blood pressure--dizziness, feeling faint or lightheaded, blurry vision Lung injury--shortness of breath or trouble breathing, cough, spitting up blood, chest pain, fever Pain, tingling, or numbness in the hands or feet Severe or prolonged diarrhea Stomach pain, bloody diarrhea, pale skin, unusual weakness or fatigue, decrease in the amount of urine, which may be signs of hemolytic uremic syndrome Sudden and severe headache, confusion, change in vision, seizures, which may be signs of posterior reversible encephalopathy syndrome (PRES) TTP--purple spots on the skin or inside the mouth, pale skin, yellowing skin or eyes, unusual weakness or fatigue, fever, fast or irregular heartbeat, confusion, change in vision, trouble speaking, trouble walking Tumor lysis syndrome (TLS)--nausea, vomiting, diarrhea, decrease in the amount of urine, dark urine, unusual weakness or fatigue, confusion, muscle pain or cramps, fast or irregular heartbeat, joint pain Side effects that usually do not require medical attention (report to your care team if they continue or are bothersome): Constipation Diarrhea Fatigue Loss of appetite Nausea This list may not describe all  possible side effects. Call your doctor for medical advice about side effects. You may report side effects to FDA at 1-800-FDA-1088. Where should I keep my medication? This medication is given in a hospital or clinic. It will not be stored at home. NOTE: This sheet is a summary. It may not cover all possible information. If you have questions about this medicine, talk to your doctor, pharmacist, or health care provider.  2024 [employer]vier/Gold Standard (2021-11-18 00:00:00)

## 2023-09-24 ENCOUNTER — Other Ambulatory Visit: Payer: Self-pay | Admitting: Hematology & Oncology

## 2023-09-24 DIAGNOSIS — C9000 Multiple myeloma not having achieved remission: Secondary | ICD-10-CM

## 2023-09-29 ENCOUNTER — Inpatient Hospital Stay: Payer: Medicare Other | Attending: Hematology & Oncology | Admitting: Hematology & Oncology

## 2023-09-29 ENCOUNTER — Inpatient Hospital Stay: Payer: Medicare Other

## 2023-09-29 ENCOUNTER — Encounter: Payer: Self-pay | Admitting: Hematology & Oncology

## 2023-09-29 VITALS — BP 119/68 | HR 63 | Temp 97.5°F | Resp 20 | Ht 69.0 in | Wt 176.0 lb

## 2023-09-29 DIAGNOSIS — C9000 Multiple myeloma not having achieved remission: Secondary | ICD-10-CM

## 2023-09-29 DIAGNOSIS — D509 Iron deficiency anemia, unspecified: Secondary | ICD-10-CM | POA: Insufficient documentation

## 2023-09-29 DIAGNOSIS — Z79899 Other long term (current) drug therapy: Secondary | ICD-10-CM | POA: Insufficient documentation

## 2023-09-29 DIAGNOSIS — Z5112 Encounter for antineoplastic immunotherapy: Secondary | ICD-10-CM | POA: Diagnosis present

## 2023-09-29 LAB — CMP (CANCER CENTER ONLY)
ALT: 25 U/L (ref 0–44)
AST: 20 U/L (ref 15–41)
Albumin: 4.2 g/dL (ref 3.5–5.0)
Alkaline Phosphatase: 51 U/L (ref 38–126)
Anion gap: 8 (ref 5–15)
BUN: 23 mg/dL (ref 8–23)
CO2: 27 mmol/L (ref 22–32)
Calcium: 9.3 mg/dL (ref 8.9–10.3)
Chloride: 101 mmol/L (ref 98–111)
Creatinine: 1.11 mg/dL (ref 0.61–1.24)
GFR, Estimated: >60 mL/min (ref >60)
Glucose, Bld: 204 mg/dL — ABNORMAL HIGH (ref 70–99)
Potassium: 4.1 mmol/L (ref 3.5–5.1)
Sodium: 136 mmol/L (ref 135–145)
Total Bilirubin: 0.4 mg/dL (ref 0.0–1.2)
Total Protein: 7.4 g/dL (ref 6.5–8.1)

## 2023-09-29 LAB — CBC WITH DIFFERENTIAL (CANCER CENTER ONLY)
Abs Immature Granulocytes: 0.01 10*3/uL (ref 0.00–0.07)
Basophils Absolute: 0 10*3/uL (ref 0.0–0.1)
Basophils Relative: 1 %
Eosinophils Absolute: 0.1 10*3/uL (ref 0.0–0.5)
Eosinophils Relative: 3 %
HCT: 32.7 % — ABNORMAL LOW (ref 39.0–52.0)
Hemoglobin: 10.5 g/dL — ABNORMAL LOW (ref 13.0–17.0)
Immature Granulocytes: 0 %
Lymphocytes Relative: 45 %
Lymphs Abs: 1.8 10*3/uL (ref 0.7–4.0)
MCH: 26.8 pg (ref 26.0–34.0)
MCHC: 32.1 g/dL (ref 30.0–36.0)
MCV: 83.4 fL (ref 80.0–100.0)
Monocytes Absolute: 0.9 10*3/uL (ref 0.1–1.0)
Monocytes Relative: 23 %
Neutro Abs: 1.1 10*3/uL — ABNORMAL LOW (ref 1.7–7.7)
Neutrophils Relative %: 28 %
Platelet Count: 197 10*3/uL (ref 150–400)
RBC: 3.92 MIL/uL — ABNORMAL LOW (ref 4.22–5.81)
RDW: 15.3 % (ref 11.5–15.5)
WBC Count: 3.9 10*3/uL — ABNORMAL LOW (ref 4.0–10.5)
nRBC: 0 % (ref 0.0–0.2)

## 2023-09-29 LAB — LACTATE DEHYDROGENASE: LDH: 143 U/L (ref 98–192)

## 2023-09-29 MED ORDER — DIPHENHYDRAMINE HCL 25 MG PO CAPS: 50.0000 mg | ORAL_CAPSULE | Freq: Once | ORAL | Status: DC

## 2023-09-29 MED ORDER — DARATUMUMAB-HYALURONIDASE-FIHJ 1800-30000 MG-UT/15ML ~~LOC~~ SOLN
1800.0000 mg | Freq: Once | SUBCUTANEOUS | Status: AC
  Administered 2023-09-29: 1800 mg via SUBCUTANEOUS
  Filled 2023-09-29: qty 15

## 2023-09-29 MED ORDER — ACETAMINOPHEN 325 MG PO TABS
650.0000 mg | ORAL_TABLET | Freq: Once | ORAL | Status: DC
Start: 2023-09-29 — End: 2023-09-29

## 2023-09-29 MED ORDER — BORTEZOMIB CHEMO SQ INJECTION 3.5 MG (2.5MG/ML)
1.3000 mg/m2 | Freq: Once | INTRAMUSCULAR | Status: AC
  Administered 2023-09-29: 2.5 mg via SUBCUTANEOUS
  Filled 2023-09-29: qty 1

## 2023-09-29 NOTE — Progress Notes (Signed)
 Hematology and Oncology Follow Up Visit  [name] [id number] 13-Jun-1946 78 y.o. 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  He is doing okay.  He really has had no complaints since we last saw him.  He is eating okay.  He has had no nausea or vomiting.  He has had no cough or shortness of breath.  He has had no change in bowel or bladder habits.  When we last saw him, his monoclonal spike was up a little bit.  It was 1.3 g/dL.  The IgG level was 1900 mg/dL.  His Kappa light chain was 10.7 mg/dL.  He has had no fever.  He has had no bleeding.  There has been no leg swelling.  His diabetes continues to be an issue.  He now is on Odin, Jardiance, and glipizide.  Hopefully this will get his blood sugars down.  Overall, I will have to set his performance status probably ECOG 1.    Wt Readings from Last 3 Encounters:  09/29/23 176 lb (79.8 kg)  09/08/23 176 lb (79.8 kg)  08/18/23 176 lb 1.9 oz (79.9 kg)    Medications:  Allergies as of 09/29/2023       Reactions   Clindamycin Hcl Itching   Famvir [famciclovir] Itching   Metoprolol Succinate Other (See Comments)   Pt does not recall.        Medication List        Accurate as of September 29, 2023 10:48 AM. If you have any questions, ask your nurse or doctor.          STOP taking these medications    furosemide 20 MG tablet Commonly known as: LASIX Stopped by: Josph Macho   prochlorperazine 10 MG tablet Commonly known as: COMPAZINE Stopped by: Josph Macho       TAKE these medications    amLODipine 10 MG tablet Commonly known as:  NORVASC Take 10 mg by mouth every morning.   aspirin EC 325 MG tablet Take 325 mg by mouth daily.   docusate sodium 50 MG capsule Commonly known as: COLACE Take 50 mg by mouth daily as needed for mild constipation.   ezetimibe 10 MG tablet Commonly known as: ZETIA Take 10 mg by mouth daily.   glipiZIDE 2.5 MG 24 hr tablet Commonly known as: GLUCOTROL XL Take 2.5 mg by mouth daily.   Jardiance 25 MG Tabs tablet Generic drug: empagliflozin Take 25 mg by mouth daily.   lenalidomide 15 MG capsule Commonly known as: REVLIMID Take 1 capsule by mouth once daily for 21 days, then 7 days off.   Mounjaro 5 MG/0.5ML Pen Generic drug: tirzepatide Inject 2.5 mg into the skin once a week.   omeprazole 20 MG capsule Commonly known as: PRILOSEC Take 20 mg by mouth 2 (two) times daily before a meal.   ondansetron 8 MG tablet Commonly known as: Zofran Take 1 tablet (8 mg total) by mouth every 8 (eight) hours as needed for nausea or vomiting.   rosuvastatin 40 MG tablet Commonly known as: CRESTOR Take 40 mg  by mouth daily.   Testosterone 25 MG/2.5GM (1%) Gel Place 1 mg onto the skin every other day.   triamterene-hydrochlorothiazide 37.5-25 MG tablet Commonly known as: MAXZIDE-25 Take 1 tablet by mouth every morning.   valsartan 320 MG tablet Commonly known as: DIOVAN Take 320 mg by mouth daily.        Allergies:  Allergies  Allergen Reactions   Clindamycin Hcl Itching   Famvir [Famciclovir] Itching   Metoprolol Succinate Other (See Comments)    Pt does not recall.    Past Medical History, Surgical history, Social history, and Family History were reviewed and updated.  Review of Systems: Review of Systems  Constitutional: Negative.   HENT: Negative.    Eyes: Negative.   Respiratory: Negative.    Cardiovascular: Negative.   Gastrointestinal: Negative.   Genitourinary: Negative.   Musculoskeletal: Negative.   Skin: Negative.   Neurological: Negative.    Endo/Heme/Allergies: Negative.   Psychiatric/Behavioral: Negative.        Physical Exam:  height is 5\' 9"  (1.753 m) and weight is 176 lb (79.8 kg). His oral temperature is 97.5 F (36.4 C) (abnormal). His blood pressure is 119/68 and his pulse is 63. His respiration is 20 and oxygen saturation is 100%.    Wt Readings from Last 3 Encounters:  09/29/23 176 lb (79.8 kg)  09/08/23 176 lb (79.8 kg)  08/18/23 176 lb 1.9 oz (79.9 kg)    Physical Exam Vitals reviewed.  HENT:     Head: Normocephalic and atraumatic.  Eyes:     Pupils: Pupils are equal, round, and reactive to light.  Cardiovascular:     Rate and Rhythm: Normal rate and regular rhythm.     Heart sounds: Normal heart sounds.  Pulmonary:     Effort: Pulmonary effort is normal.     Breath sounds: Normal breath sounds.  Abdominal:     General: Bowel sounds are normal.     Palpations: Abdomen is soft.  Musculoskeletal:        General: No tenderness or deformity. Normal range of motion.     Cervical back: Normal range of motion.  Lymphadenopathy:     Cervical: No cervical adenopathy.  Skin:    General: Skin is warm and dry.     Findings: No erythema or rash.  Neurological:     Mental Status: He is alert and oriented to person, place, and time.  Psychiatric:        Behavior: Behavior normal.        Thought Content: Thought content normal.        Judgment: Judgment normal.      Lab Results  Component Value Date   WBC 3.9 (L) 09/29/2023   HGB 10.5 (L) 09/29/2023   HCT 32.7 (L) 09/29/2023   MCV 83.4 09/29/2023   PLT 197 09/29/2023   Lab Results  Component Value Date   FERRITIN 42 05/20/2023   IRON 112 05/20/2023   TIBC 430 05/20/2023   UIBC 318 05/20/2023   IRONPCTSAT 26 05/20/2023   Lab Results  Component Value Date   RETICCTPCT 2.3 08/31/2022   RBC 3.92 (L) 09/29/2023   Lab Results  Component Value Date   KPAFRELGTCHN 107.4 (H) 09/08/2023   LAMBDASER 6.6 09/08/2023   KAPLAMBRATIO 16.27 (H)  09/08/2023   Lab Results  Component Value Date   IGGSERUM 1,893 (H) 09/08/2023   IGA 24 (L) 09/08/2023   IGMSERUM 19 09/08/2023   Lab Results  Component Value Date   TOTALPROTELP  7.2 09/08/2023   ALBUMINELP 3.5 09/08/2023   A1GS 0.2 09/08/2023   A2GS 0.8 09/08/2023   BETS 1.0 09/08/2023   GAMS 1.7 09/08/2023   MSPIKE 1.3 (H) 09/08/2023   SPEI Comment 05/20/2023     Chemistry      Component Value Date/Time   NA 136 09/29/2023 0940   NA 139 06/20/2020 0923   K 4.1 09/29/2023 0940   CL 101 09/29/2023 0940   CO2 27 09/29/2023 0940   BUN 23 09/29/2023 0940   BUN 17 06/20/2020 0923   CREATININE 1.11 09/29/2023 0940   CREATININE 1.12 09/03/2015 1201      Component Value Date/Time   CALCIUM 9.3 09/29/2023 0940   ALKPHOS 51 09/29/2023 0940   AST 20 09/29/2023 0940   ALT 25 09/29/2023 0940   BILITOT 0.4 09/29/2023 0940       Impression and Plan: [name] is a very pleasant 78 yo caucasian gentleman with IgG kappa multiple myeloma.  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  His FISH panel was negative. Currently he is on daratumumab/Velcade/Revlimid without Decadron.   From my point of view, everything is holding nice and steady.  His blood sugars will continue to be his biggest problem.  He said he had his medications readjusted.  I think he was put on Jardiance and taken off metformin.  We will go ahead with his 8th cycle of chemotherapy.  If I find that his monoclonal studies are worse, then movement have to make a change with his protocol.  I may switch out the Velcade and start him on Kyprolis.  I will plan to get him back to see me in 3 weeks.     Josph Macho, MD 4/2/202510:48 AM

## 2023-09-29 NOTE — Patient Instructions (Signed)
 [facility] - A DEPT OF [facility]  Discharge Instructions: Thank you for choosing [facility] to provide your oncology and hematology care.   If you have a lab appointment with the Cancer Center, please go directly to the Cancer Center and check in at the registration area.  Wear comfortable clothing and clothing appropriate for easy access to any Portacath or PICC line.   We strive to give you quality time with your provider. You may need to reschedule your appointment if you arrive late (15 or more minutes).  Arriving late affects you and other patients whose appointments are after yours.  Also, if you miss three or more appointments without notifying the office, you may be dismissed from the clinic at the provider's discretion.      For prescription refill requests, have your pharmacy contact our office and allow 72 hours for refills to be completed.    Today you received the following chemotherapy and/or immunotherapy agents Velcade/Darzalex      To help prevent nausea and vomiting after your treatment, we encourage you to take your nausea medication as directed.  BELOW ARE SYMPTOMS THAT SHOULD BE REPORTED IMMEDIATELY: *FEVER GREATER THAN 100.4 F (38 C) OR HIGHER *CHILLS OR SWEATING *NAUSEA AND VOMITING THAT IS NOT CONTROLLED WITH YOUR NAUSEA MEDICATION *UNUSUAL SHORTNESS OF BREATH *UNUSUAL BRUISING OR BLEEDING *URINARY PROBLEMS (pain or burning when urinating, or frequent urination) *BOWEL PROBLEMS (unusual diarrhea, constipation, pain near the anus) TENDERNESS IN MOUTH AND THROAT WITH OR WITHOUT PRESENCE OF ULCERS (sore throat, sores in mouth, or a toothache) UNUSUAL RASH, SWELLING OR PAIN  UNUSUAL VAGINAL DISCHARGE OR ITCHING   Items with * indicate a potential emergency and should be followed up as soon as possible or go to the Emergency Department if any problems should occur.  Please show the CHEMOTHERAPY ALERT CARD or  IMMUNOTHERAPY ALERT CARD at check-in to the Emergency Department and triage nurse. Should you have questions after your visit or need to cancel or reschedule your appointment, please contact [facility] - A DEPT OF [facility]  [phone] and follow the prompts.  Office hours are 8:00 a.m. to 4:30 p.m. Monday - Friday. Please note that voicemails left after 4:00 p.m. may not be returned until the following business day.  We are closed weekends and major holidays. You have access to a nurse at all times for urgent questions. Please call the main number to the clinic [phone] and follow the prompts.  For any non-urgent questions, you may also contact your provider using MyChart. We now offer e-Visits for anyone 71 and older to request care online for non-urgent symptoms. For details visit [email or web address].   Also download the MyChart app! Go to the app store, search "MyChart", open the app, select , and log in with your MyChart username and password.

## 2023-09-29 NOTE — Progress Notes (Signed)
 Per Dr. Myna Hidalgo ok to treat with ANC 1.1

## 2023-09-30 LAB — IGG, IGA, IGM
IgA: 30 mg/dL — ABNORMAL LOW (ref 61–437)
IgG (Immunoglobin G), Serum: 1845 mg/dL — ABNORMAL HIGH (ref 603–1613)
IgM (Immunoglobulin M), Srm: 22 mg/dL (ref 15–143)

## 2023-09-30 LAB — KAPPA/LAMBDA LIGHT CHAINS
Kappa free light chain: 89 mg/L — ABNORMAL HIGH (ref 3.3–19.4)
Kappa, lambda light chain ratio: 9.78 — ABNORMAL HIGH (ref 0.26–1.65)
Lambda free light chains: 9.1 mg/L (ref 5.7–26.3)

## 2023-10-05 LAB — PROTEIN ELECTROPHORESIS, SERUM, WITH REFLEX
A/G Ratio: 1 (ref 0.7–1.7)
Albumin ELP: 3.5 g/dL (ref 2.9–4.4)
Alpha-1-Globulin: 0.2 g/dL (ref 0.0–0.4)
Alpha-2-Globulin: 0.8 g/dL (ref 0.4–1.0)
Beta Globulin: 0.9 g/dL (ref 0.7–1.3)
Gamma Globulin: 1.6 g/dL (ref 0.4–1.8)
Globulin, Total: 3.5 g/dL (ref 2.2–3.9)
M-Spike, %: 1.1 g/dL — ABNORMAL HIGH
SPEP Interpretation: 0
Total Protein ELP: 7 g/dL (ref 6.0–8.5)

## 2023-10-05 LAB — IMMUNOFIXATION REFLEX, SERUM
IgA: 29 mg/dL — ABNORMAL LOW (ref 61–437)
IgG (Immunoglobin G), Serum: 1903 mg/dL — ABNORMAL HIGH (ref 603–1613)
IgM (Immunoglobulin M), Srm: 27 mg/dL (ref 15–143)

## 2023-10-06 ENCOUNTER — Inpatient Hospital Stay: Payer: Medicare Other

## 2023-10-06 VITALS — BP 121/56 | HR 69 | Temp 97.8°F | Resp 19

## 2023-10-06 DIAGNOSIS — Z5112 Encounter for antineoplastic immunotherapy: Secondary | ICD-10-CM | POA: Diagnosis not present

## 2023-10-06 DIAGNOSIS — C9000 Multiple myeloma not having achieved remission: Secondary | ICD-10-CM

## 2023-10-06 LAB — CMP (CANCER CENTER ONLY)
ALT: 18 U/L (ref 0–44)
AST: 17 U/L (ref 15–41)
Albumin: 3.9 g/dL (ref 3.5–5.0)
Alkaline Phosphatase: 52 U/L (ref 38–126)
Anion gap: 7 (ref 5–15)
BUN: 23 mg/dL (ref 8–23)
CO2: 27 mmol/L (ref 22–32)
Calcium: 9.4 mg/dL (ref 8.9–10.3)
Chloride: 101 mmol/L (ref 98–111)
Creatinine: 1.26 mg/dL — ABNORMAL HIGH (ref 0.61–1.24)
GFR, Estimated: 58 mL/min — ABNORMAL LOW (ref >60)
Glucose, Bld: 243 mg/dL — ABNORMAL HIGH (ref 70–99)
Potassium: 4.4 mmol/L (ref 3.5–5.1)
Sodium: 135 mmol/L (ref 135–145)
Total Bilirubin: 0.3 mg/dL (ref 0.0–1.2)
Total Protein: 7 g/dL (ref 6.5–8.1)

## 2023-10-06 LAB — CBC WITH DIFFERENTIAL (CANCER CENTER ONLY)
Abs Immature Granulocytes: 0.01 10*3/uL (ref 0.00–0.07)
Basophils Absolute: 0 10*3/uL (ref 0.0–0.1)
Basophils Relative: 1 %
Eosinophils Absolute: 0.3 10*3/uL (ref 0.0–0.5)
Eosinophils Relative: 6 %
HCT: 32 % — ABNORMAL LOW (ref 39.0–52.0)
Hemoglobin: 10.1 g/dL — ABNORMAL LOW (ref 13.0–17.0)
Immature Granulocytes: 0 %
Lymphocytes Relative: 33 %
Lymphs Abs: 1.5 10*3/uL (ref 0.7–4.0)
MCH: 26.4 pg (ref 26.0–34.0)
MCHC: 31.6 g/dL (ref 30.0–36.0)
MCV: 83.8 fL (ref 80.0–100.0)
Monocytes Absolute: 0.6 10*3/uL (ref 0.1–1.0)
Monocytes Relative: 14 %
Neutro Abs: 2.2 10*3/uL (ref 1.7–7.7)
Neutrophils Relative %: 46 %
Platelet Count: 155 10*3/uL (ref 150–400)
RBC: 3.82 MIL/uL — ABNORMAL LOW (ref 4.22–5.81)
RDW: 14.9 % (ref 11.5–15.5)
WBC Count: 4.7 10*3/uL (ref 4.0–10.5)
nRBC: 0 % (ref 0.0–0.2)

## 2023-10-06 MED ORDER — BORTEZOMIB CHEMO SQ INJECTION 3.5 MG (2.5MG/ML)
1.3000 mg/m2 | Freq: Once | INTRAMUSCULAR | Status: AC
  Administered 2023-10-06: 2.5 mg via SUBCUTANEOUS
  Filled 2023-10-06: qty 1

## 2023-10-06 NOTE — Patient Instructions (Signed)
 [facility] - A DEPT OF [facility]  Discharge Instructions: Thank you for choosing [facility] to provide your oncology and hematology care.   If you have a lab appointment with the Cancer Center, please go directly to the Cancer Center and check in at the registration area.  Wear comfortable clothing and clothing appropriate for easy access to any Portacath or PICC line.   We strive to give you quality time with your provider. You may need to reschedule your appointment if you arrive late (15 or more minutes).  Arriving late affects you and other patients whose appointments are after yours.  Also, if you miss three or more appointments without notifying the office, you may be dismissed from the clinic at the provider's discretion.      For prescription refill requests, have your pharmacy contact our office and allow 72 hours for refills to be completed.    Today you received the following chemotherapy and/or immunotherapy agents Velcade      To help prevent nausea and vomiting after your treatment, we encourage you to take your nausea medication as directed.  BELOW ARE SYMPTOMS THAT SHOULD BE REPORTED IMMEDIATELY: *FEVER GREATER THAN 100.4 F (38 C) OR HIGHER *CHILLS OR SWEATING *NAUSEA AND VOMITING THAT IS NOT CONTROLLED WITH YOUR NAUSEA MEDICATION *UNUSUAL SHORTNESS OF BREATH *UNUSUAL BRUISING OR BLEEDING *URINARY PROBLEMS (pain or burning when urinating, or frequent urination) *BOWEL PROBLEMS (unusual diarrhea, constipation, pain near the anus) TENDERNESS IN MOUTH AND THROAT WITH OR WITHOUT PRESENCE OF ULCERS (sore throat, sores in mouth, or a toothache) UNUSUAL RASH, SWELLING OR PAIN  UNUSUAL VAGINAL DISCHARGE OR ITCHING   Items with * indicate a potential emergency and should be followed up as soon as possible or go to the Emergency Department if any problems should occur.  Please show the CHEMOTHERAPY ALERT CARD or IMMUNOTHERAPY  ALERT CARD at check-in to the Emergency Department and triage nurse. Should you have questions after your visit or need to cancel or reschedule your appointment, please contact [facility] - A DEPT OF [facility]  [phone] and follow the prompts.  Office hours are 8:00 a.m. to 4:30 p.m. Monday - Friday. Please note that voicemails left after 4:00 p.m. may not be returned until the following business day.  We are closed weekends and major holidays. You have access to a nurse at all times for urgent questions. Please call the main number to the clinic [phone] and follow the prompts.  For any non-urgent questions, you may also contact your provider using MyChart. We now offer e-Visits for anyone 69 and older to request care online for non-urgent symptoms. For details visit [email or web address].   Also download the MyChart app! Go to the app store, search "MyChart", open the app, select [place], and log in with your MyChart username and password.

## 2023-10-15 ENCOUNTER — Other Ambulatory Visit: Payer: Self-pay | Admitting: *Deleted

## 2023-10-15 DIAGNOSIS — C9000 Multiple myeloma not having achieved remission: Secondary | ICD-10-CM

## 2023-10-15 MED ORDER — LENALIDOMIDE 15 MG PO CAPS
ORAL_CAPSULE | ORAL | 0 refills | Status: DC
Start: 2023-10-15 — End: 2023-11-26

## 2023-10-20 ENCOUNTER — Inpatient Hospital Stay (HOSPITAL_BASED_OUTPATIENT_CLINIC_OR_DEPARTMENT_OTHER): Admitting: Hematology & Oncology

## 2023-10-20 ENCOUNTER — Inpatient Hospital Stay

## 2023-10-20 ENCOUNTER — Encounter: Payer: Self-pay | Admitting: Hematology & Oncology

## 2023-10-20 VITALS — BP 131/61 | HR 62 | Temp 98.0°F | Resp 20 | Ht 69.0 in | Wt 179.0 lb

## 2023-10-20 DIAGNOSIS — R799 Abnormal finding of blood chemistry, unspecified: Secondary | ICD-10-CM

## 2023-10-20 DIAGNOSIS — C9000 Multiple myeloma not having achieved remission: Secondary | ICD-10-CM

## 2023-10-20 DIAGNOSIS — Z5112 Encounter for antineoplastic immunotherapy: Secondary | ICD-10-CM | POA: Diagnosis not present

## 2023-10-20 LAB — CBC WITH DIFFERENTIAL (CANCER CENTER ONLY)
Abs Immature Granulocytes: 0.02 10*3/uL (ref 0.00–0.07)
Basophils Absolute: 0 10*3/uL (ref 0.0–0.1)
Basophils Relative: 0 %
Eosinophils Absolute: 0.2 10*3/uL (ref 0.0–0.5)
Eosinophils Relative: 4 %
HCT: 32 % — ABNORMAL LOW (ref 39.0–52.0)
Hemoglobin: 10 g/dL — ABNORMAL LOW (ref 13.0–17.0)
Immature Granulocytes: 0 %
Lymphocytes Relative: 28 %
Lymphs Abs: 1.4 10*3/uL (ref 0.7–4.0)
MCH: 26 pg (ref 26.0–34.0)
MCHC: 31.3 g/dL (ref 30.0–36.0)
MCV: 83.3 fL (ref 80.0–100.0)
Monocytes Absolute: 0.7 10*3/uL (ref 0.1–1.0)
Monocytes Relative: 15 %
Neutro Abs: 2.6 10*3/uL (ref 1.7–7.7)
Neutrophils Relative %: 53 %
Platelet Count: 110 10*3/uL — ABNORMAL LOW (ref 150–400)
RBC: 3.84 MIL/uL — ABNORMAL LOW (ref 4.22–5.81)
RDW: 15.4 % (ref 11.5–15.5)
WBC Count: 4.9 10*3/uL (ref 4.0–10.5)
nRBC: 0 % (ref 0.0–0.2)

## 2023-10-20 LAB — CMP (CANCER CENTER ONLY)
ALT: 21 U/L (ref 0–44)
AST: 17 U/L (ref 15–41)
Albumin: 3.8 g/dL (ref 3.5–5.0)
Alkaline Phosphatase: 46 U/L (ref 38–126)
Anion gap: 7 (ref 5–15)
BUN: 18 mg/dL (ref 8–23)
CO2: 26 mmol/L (ref 22–32)
Calcium: 8.8 mg/dL — ABNORMAL LOW (ref 8.9–10.3)
Chloride: 105 mmol/L (ref 98–111)
Creatinine: 1.05 mg/dL (ref 0.61–1.24)
GFR, Estimated: >60 mL/min (ref >60)
Glucose, Bld: 201 mg/dL — ABNORMAL HIGH (ref 70–99)
Potassium: 4.2 mmol/L (ref 3.5–5.1)
Sodium: 138 mmol/L (ref 135–145)
Total Bilirubin: 0.3 mg/dL (ref 0.0–1.2)
Total Protein: 6.4 g/dL — ABNORMAL LOW (ref 6.5–8.1)

## 2023-10-20 LAB — LACTATE DEHYDROGENASE: LDH: 148 U/L (ref 98–192)

## 2023-10-20 MED ORDER — ACETAMINOPHEN 325 MG PO TABS: 650.0000 mg | ORAL_TABLET | Freq: Once | ORAL | Status: DC

## 2023-10-20 MED ORDER — DIPHENHYDRAMINE HCL 25 MG PO CAPS: 50.0000 mg | ORAL_CAPSULE | Freq: Once | ORAL | Status: DC

## 2023-10-20 MED ORDER — DARATUMUMAB-HYALURONIDASE-FIHJ 1800-30000 MG-UT/15ML ~~LOC~~ SOLN
1800.0000 mg | Freq: Once | SUBCUTANEOUS | Status: AC
  Administered 2023-10-20: 1800 mg via SUBCUTANEOUS
  Filled 2023-10-20: qty 15

## 2023-10-20 MED ORDER — BORTEZOMIB CHEMO SQ INJECTION 3.5 MG (2.5MG/ML)
1.3000 mg/m2 | Freq: Once | INTRAMUSCULAR | Status: AC
  Administered 2023-10-20: 2.5 mg via SUBCUTANEOUS
  Filled 2023-10-20: qty 1

## 2023-10-20 NOTE — Patient Instructions (Signed)
 Daratumumab Injection What is this medication? DARATUMUMAB (dar a toom ue mab) treats multiple myeloma, a type of bone marrow cancer. It works by helping your immune system slow or stop the spread of cancer cells. It is a monoclonal antibody. This medicine may be used for other purposes; ask your health care provider or pharmacist if you have questions. COMMON BRAND NAME(S): DARZALEX What should I tell my care team before I take this medication? They need to know if you have any of these conditions: Hereditary fructose intolerance Infection, such as chickenpox, herpes, hepatitis B Lung or breathing disease, such as asthma, COPD An unusual or allergic reaction to daratumumab, sorbitol, other medications, foods, dyes, or preservatives Pregnant or trying to get pregnant Breastfeeding How should I use this medication? This medication is injected into a vein. It is given by your care team in a hospital or clinic setting. Talk to your care team about the use of this medication in children. Special care may be needed. Overdosage: If you think you have taken too much of this medicine contact a poison control center or emergency room at once. NOTE: This medicine is only for you. Do not share this medicine with others. What if I miss a dose? Keep appointments for follow-up doses. It is important not to miss your dose. Call your care team if you are unable to keep an appointment. What may interact with this medication? Interactions have not been studied. This list may not describe all possible interactions. Give your health care provider a list of all the medicines, herbs, non-prescription drugs, or dietary supplements you use. Also tell them if you smoke, drink alcohol, or use illegal drugs. Some items may interact with your medicine. What should I watch for while using this medication? Your condition will be monitored carefully while you are receiving this medication. This medication can cause  serious allergic reactions. To reduce your risk, your care team may give you other medication to take before receiving this one. Be sure to follow the directions from your care team. This medication can affect the results of blood tests to match your blood type. These changes can last for up to 6 months after the final dose. Your care team will do blood tests to match your blood type before you start treatment. Tell all of your care team that you are being treated with this medication before receiving a blood transfusion. This medication can affect the results of some tests used to determine treatment response; extra tests may be needed to evaluate response. Talk to your care team if you wish to become pregnant or think you are pregnant. This medication can cause serious birth defects if taken during pregnancy and for 3 months after the last dose. A reliable form of contraception is recommended while taking this medication and for 3 months after the last dose. Talk to your care team about effective forms of contraception. Do not breast-feed while taking this medication. What side effects may I notice from receiving this medication? Side effects that you should report to your care team as soon as possible: Allergic reactions--skin rash, itching, hives, swelling of the face, lips, tongue, or throat Infection--fever, chills, cough, sore throat, wounds that don't heal, pain or trouble when passing urine, general feeling of discomfort or being unwell Infusion reactions--chest pain, shortness of breath or trouble breathing, feeling faint or lightheaded Unusual bruising or bleeding Side effects that usually do not require medical attention (report to your care team if they continue or  are bothersome): Constipation Diarrhea Fatigue Nausea Pain, tingling, or numbness in the hands or feet Swelling of the ankles, hands, or feet This list may not describe all possible side effects. Call your doctor for medical  advice about side effects. You may report side effects to FDA at 1-800-FDA-1088. Where should I keep my medication? This medication is given in a hospital or clinic. It will not be stored at home. NOTE: This sheet is a summary. It may not cover all possible information. If you have questions about this medicine, talk to your doctor, pharmacist, or health care provider.  2024 Elsevier/Gold Standard (2022-04-23 00:00:00)Bortezomib Injection What is this medication? BORTEZOMIB (bor TEZ oh mib) treats lymphoma. It may also be used to treat multiple myeloma, a type of bone marrow cancer. It works by blocking a protein that causes cancer cells to grow and multiply. This helps to slow or stop the spread of cancer cells. This medicine may be used for other purposes; ask your health care provider or pharmacist if you have questions. COMMON BRAND NAME(S): BORUZU, Velcade What should I tell my care team before I take this medication? They need to know if you have any of these conditions: Dehydration Diabetes Heart disease Liver disease Tingling of the fingers or toes or other nerve disorder An unusual or allergic reaction to bortezomib, other medications, foods, dyes, or preservatives If you or your partner are pregnant or trying to get pregnant Breastfeeding How should I use this medication? This medication is injected into a vein or under the skin. It is given by your care team in a hospital or clinic setting. Talk to your care team about the use of this medication in children. Special care may be needed. Overdosage: If you think you have taken too much of this medicine contact a poison control center or emergency room at once. NOTE: This medicine is only for you. Do not share this medicine with others. What if I miss a dose? Keep appointments for follow-up doses. It is important not to miss your dose. Call your care team if you are unable to keep an appointment. What may interact with this  medication? Ketoconazole Rifampin This list may not describe all possible interactions. Give your health care provider a list of all the medicines, herbs, non-prescription drugs, or dietary supplements you use. Also tell them if you smoke, drink alcohol, or use illegal drugs. Some items may interact with your medicine. What should I watch for while using this medication? Your condition will be monitored carefully while you are receiving this medication. You may need blood work while taking this medication. This medication may affect your coordination, reaction time, or judgment. Do not drive or operate machinery until you know how this medication affects you. Sit up or stand slowly to reduce the risk of dizzy or fainting spells. Drinking alcohol with this medication can increase the risk of these side effects. This medication may increase your risk of getting an infection. Call your care team for advice if you get a fever, chills, sore throat, or other symptoms of a cold or flu. Do not treat yourself. Try to avoid being around people who are sick. Check with your care team if you have severe diarrhea, nausea, and vomiting, or if you sweat a lot. The loss of too much body fluid may make it dangerous for you to take this medication. Talk to your care team if you may be pregnant. Serious birth defects can occur if you take this medication  during pregnancy and for 7 months after the last dose. You will need a negative pregnancy test before starting this medication. Contraception is recommended while taking this medication and for 7 months after the last dose. Your care team can help you find the option that works for you. If your partner can get pregnant, use a condom during sex while taking this medication and for 4 months after the last dose. Do not breastfeed while taking this medication and for 2 months after the last dose. This medication may cause infertility. Talk to your care team if you are  concerned about your fertility. What side effects may I notice from receiving this medication? Side effects that you should report to your care team as soon as possible: Allergic reactions--skin rash, itching, hives, swelling of the face, lips, tongue, or throat Bleeding--bloody or black, tar-like stools, vomiting blood or brown material that looks like coffee grounds, red or dark brown urine, small red or purple spots on skin, unusual bruising or bleeding Bleeding in the brain--severe headache, stiff neck, confusion, dizziness, change in vision, numbness or weakness of the face, arm, or leg, trouble speaking, trouble walking, vomiting Bowel blockage--stomach cramping, unable to have a bowel movement or pass gas, loss of appetite, vomiting Heart failure--shortness of breath, swelling of the ankles, feet, or hands, sudden weight gain, unusual weakness or fatigue Infection--fever, chills, cough, sore throat, wounds that don't heal, pain or trouble when passing urine, general feeling of discomfort or being unwell Liver injury--right upper belly pain, loss of appetite, nausea, light-colored stool, dark yellow or brown urine, yellowing skin or eyes, unusual weakness or fatigue Low blood pressure--dizziness, feeling faint or lightheaded, blurry vision Lung injury--shortness of breath or trouble breathing, cough, spitting up blood, chest pain, fever Pain, tingling, or numbness in the hands or feet Severe or prolonged diarrhea Stomach pain, bloody diarrhea, pale skin, unusual weakness or fatigue, decrease in the amount of urine, which may be signs of hemolytic uremic syndrome Sudden and severe headache, confusion, change in vision, seizures, which may be signs of posterior reversible encephalopathy syndrome (PRES) TTP--purple spots on the skin or inside the mouth, pale skin, yellowing skin or eyes, unusual weakness or fatigue, fever, fast or irregular heartbeat, confusion, change in vision, trouble speaking,  trouble walking Tumor lysis syndrome (TLS)--nausea, vomiting, diarrhea, decrease in the amount of urine, dark urine, unusual weakness or fatigue, confusion, muscle pain or cramps, fast or irregular heartbeat, joint pain Side effects that usually do not require medical attention (report to your care team if they continue or are bothersome): Constipation Diarrhea Fatigue Loss of appetite Nausea This list may not describe all possible side effects. Call your doctor for medical advice about side effects. You may report side effects to FDA at 1-800-FDA-1088. Where should I keep my medication? This medication is given in a hospital or clinic. It will not be stored at home. NOTE: This sheet is a summary. It may not cover all possible information. If you have questions about this medicine, talk to your doctor, pharmacist, or health care provider.  2024 Elsevier/Gold Standard (2021-11-18 00:00:00)

## 2023-10-20 NOTE — Progress Notes (Signed)
 Hematology and Oncology Follow Up Visit  [name] [id number] 04-03-1946 78 y.o. 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  He looks pretty good.  He feels okay.  His blood sugars are still a little bit of a problem.  There is still quite high.  I know that his family doctor will try to help manage this.  As far as his myeloma is concerned, he has been doing pretty well.  His last monoclonal spike was 1.1 g/dL.  The Ig G level was 1870 mg/dL.  His Kappa light chain was 8.9 mg/dL.  He has had no problems with neuropathy.  If there is neuropathy, it is from his diabetes..  He has had no cough or shortness of breath.  He has had no diarrhea.  He is little constipated.  He has had no rashes.  There is been no bleeding.  He has had no leg swelling.  Overall, I would say that his performance status is probably ECOG 1.    Wt Readings from Last 3 Encounters:  10/20/23 179 lb (81.2 kg)  09/29/23 176 lb (79.8 kg)  09/08/23 176 lb (79.8 kg)    Medications:  Allergies as of 10/20/2023       Reactions   Clindamycin Hcl Itching   Famvir  [famciclovir ] Itching   Metoprolol  Succinate Other (See Comments)   Pt does not recall.        Medication List        Accurate as of October 20, 2023  9:02 AM. If you have any questions, ask your nurse or doctor.          STOP taking these medications    docusate sodium  50 MG capsule Commonly known as: COLACE Stopped by: Emmaleigh Longo R Najeh Credit       TAKE these medications    amLODipine 10 MG tablet Commonly known as: NORVASC Take 10 mg by mouth every morning.   aspirin  EC  325 MG tablet Take 325 mg by mouth daily.   ezetimibe 10 MG tablet Commonly known as: ZETIA Take 10 mg by mouth daily.   glipiZIDE 2.5 MG 24 hr tablet Commonly known as: GLUCOTROL XL Take 2.5 mg by mouth daily.   Jardiance 25 MG Tabs tablet Generic drug: empagliflozin Take 25 mg by mouth daily.   lenalidomide  15 MG capsule Commonly known as: REVLIMID  Take one tablet daily for three weeks (21 days) and then off for one week (7 days)-[employer] Auth # [id number]     Date Obtained 10/15/2023 What changed: additional instructions   Mounjaro 5 MG/0.5ML Pen Generic drug: tirzepatide Inject 2.5 mg into the skin once a week.   omeprazole  20 MG capsule Commonly known as: PRILOSEC Take 20 mg by mouth 2 (two) times daily before a meal.   ondansetron  8 MG tablet Commonly known as: Zofran  Take 1 tablet (8 mg total) by mouth every 8 (eight) hours as needed for nausea or vomiting.   rosuvastatin  40 MG tablet Commonly known as: CRESTOR  Take 40 mg by mouth daily.   Testosterone 25 MG/2.5GM (1%) Gel Place 1 mg onto  the skin every other day.   triamterene-hydrochlorothiazide 37.5-25 MG tablet Commonly known as: MAXZIDE-25 Take 1 tablet by mouth every morning.   valsartan 320 MG tablet Commonly known as: DIOVAN Take 320 mg by mouth daily.        Allergies:  Allergies  Allergen Reactions   Clindamycin Hcl Itching   Famvir  [Famciclovir ] Itching   Metoprolol  Succinate Other (See Comments)    Pt does not recall.    Past Medical History, Surgical history, Social history, and Family History were reviewed and updated.  Review of Systems: Review of Systems  Constitutional: Negative.   HENT: Negative.    Eyes: Negative.   Respiratory: Negative.    Cardiovascular: Negative.   Gastrointestinal: Negative.   Genitourinary: Negative.   Musculoskeletal: Negative.   Skin: Negative.   Neurological: Negative.   Endo/Heme/Allergies: Negative.   Psychiatric/Behavioral: Negative.         Physical Exam:  height is 5\' 9"  (1.753 m) and weight is 179 lb (81.2 kg). His oral temperature is 98 F (36.7 C). His blood pressure is 131/61 and his pulse is 62. His respiration is 20 and oxygen saturation is 98%.    Wt Readings from Last 3 Encounters:  10/20/23 179 lb (81.2 kg)  09/29/23 176 lb (79.8 kg)  09/08/23 176 lb (79.8 kg)    Physical Exam Vitals reviewed.  HENT:     Head: Normocephalic and atraumatic.  Eyes:     Pupils: Pupils are equal, round, and reactive to light.  Cardiovascular:     Rate and Rhythm: Normal rate and regular rhythm.     Heart sounds: Normal heart sounds.  Pulmonary:     Effort: Pulmonary effort is normal.     Breath sounds: Normal breath sounds.  Abdominal:     General: Bowel sounds are normal.     Palpations: Abdomen is soft.  Musculoskeletal:        General: No tenderness or deformity. Normal range of motion.     Cervical back: Normal range of motion.  Lymphadenopathy:     Cervical: No cervical adenopathy.  Skin:    General: Skin is warm and dry.     Findings: No erythema or rash.  Neurological:     Mental Status: He is alert and oriented to person, place, and time.  Psychiatric:        Behavior: Behavior normal.        Thought Content: Thought content normal.        Judgment: Judgment normal.      Lab Results  Component Value Date   WBC 4.9 10/20/2023   HGB 10.0 (L) 10/20/2023   HCT 32.0 (L) 10/20/2023   MCV 83.3 10/20/2023   PLT 110 (L) 10/20/2023   Lab Results  Component Value Date   FERRITIN 42 05/20/2023   IRON 112 05/20/2023   TIBC 430 05/20/2023   UIBC 318 05/20/2023   IRONPCTSAT 26 05/20/2023   Lab Results  Component Value Date   RETICCTPCT 2.3 08/31/2022   RBC 3.84 (L) 10/20/2023   Lab Results  Component Value Date   KPAFRELGTCHN 89.0 (H) 09/29/2023   LAMBDASER 9.1 09/29/2023   KAPLAMBRATIO 9.78 (H) 09/29/2023   Lab Results  Component Value Date   IGGSERUM 1,845 (H) 09/29/2023   IGGSERUM 1,903 (H)  09/29/2023   IGA 30 (L) 09/29/2023   IGA 29 (L) 09/29/2023   IGMSERUM 22 09/29/2023   IGMSERUM 27 09/29/2023   Lab Results  Component Value Date   TOTALPROTELP  7.0 09/29/2023   ALBUMINELP 3.5 09/29/2023   A1GS 0.2 09/29/2023   A2GS 0.8 09/29/2023   BETS 0.9 09/29/2023   GAMS 1.6 09/29/2023   MSPIKE 1.1 (H) 09/29/2023   SPEI Comment 05/20/2023     Chemistry      Component Value Date/Time   NA 135 10/06/2023 0927   NA 139 06/20/2020 0923   K 4.4 10/06/2023 0927   CL 101 10/06/2023 0927   CO2 27 10/06/2023 0927   BUN 23 10/06/2023 0927   BUN 17 06/20/2020 0923   CREATININE 1.26 (H) 10/06/2023 0927   CREATININE 1.12 09/03/2015 1201      Component Value Date/Time   CALCIUM  9.4 10/06/2023 0927   ALKPHOS 52 10/06/2023 0927   AST 17 10/06/2023 0927   ALT 18 10/06/2023 0927   BILITOT 0.3 10/06/2023 0927       Impression and Plan: [name] is a very pleasant 78 yo caucasian gentleman with IgG kappa multiple myeloma.  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  His FISH panel was negative. Currently he is on daratumumab /Velcade /Revlimid  without Decadron .   From my point of view, everything is holding nice and steady.  His blood sugars will continue to be his biggest problem.  He said he had his medications readjusted.  I think he was put on Jardiance and taken off metformin.  We will go ahead with his 9th cycle of chemotherapy.  If I find that his monoclonal studies are worse, then movement have to make a change with his protocol.  I may switch out the Velcade  and start him on Kyprolis.  I will plan to get him back to see me in 4 weeks.     Ivor Mars, MD 4/23/20259:02 AM

## 2023-10-21 LAB — KAPPA/LAMBDA LIGHT CHAINS
Kappa free light chain: 76.4 mg/L — ABNORMAL HIGH (ref 3.3–19.4)
Kappa, lambda light chain ratio: 7.01 — ABNORMAL HIGH (ref 0.26–1.65)
Lambda free light chains: 10.9 mg/L (ref 5.7–26.3)

## 2023-10-21 LAB — IGG, IGA, IGM
IgA: 32 mg/dL — ABNORMAL LOW (ref 61–437)
IgG (Immunoglobin G), Serum: 1622 mg/dL — ABNORMAL HIGH (ref 603–1613)
IgM (Immunoglobulin M), Srm: 23 mg/dL (ref 15–143)

## 2023-10-22 ENCOUNTER — Encounter: Payer: Self-pay | Admitting: Hematology & Oncology

## 2023-10-25 LAB — PROTEIN ELECTROPHORESIS, SERUM, WITH REFLEX
A/G Ratio: 1.1 (ref 0.7–1.7)
Albumin ELP: 3.5 g/dL (ref 2.9–4.4)
Alpha-1-Globulin: 0.2 g/dL (ref 0.0–0.4)
Alpha-2-Globulin: 0.7 g/dL (ref 0.4–1.0)
Beta Globulin: 0.9 g/dL (ref 0.7–1.3)
Gamma Globulin: 1.5 g/dL (ref 0.4–1.8)
Globulin, Total: 3.3 g/dL (ref 2.2–3.9)
M-Spike, %: 1 g/dL — ABNORMAL HIGH
SPEP Interpretation: 0
Total Protein ELP: 6.8 g/dL (ref 6.0–8.5)

## 2023-10-25 LAB — IMMUNOFIXATION REFLEX, SERUM
IgA: 32 mg/dL — ABNORMAL LOW (ref 61–437)
IgG (Immunoglobin G), Serum: 1681 mg/dL — ABNORMAL HIGH (ref 603–1613)
IgM (Immunoglobulin M), Srm: 21 mg/dL (ref 15–143)

## 2023-10-27 ENCOUNTER — Inpatient Hospital Stay

## 2023-11-17 ENCOUNTER — Inpatient Hospital Stay

## 2023-11-17 ENCOUNTER — Ambulatory Visit

## 2023-11-17 ENCOUNTER — Inpatient Hospital Stay (HOSPITAL_BASED_OUTPATIENT_CLINIC_OR_DEPARTMENT_OTHER): Admitting: Medical Oncology

## 2023-11-17 ENCOUNTER — Ambulatory Visit: Admitting: Medical Oncology

## 2023-11-17 ENCOUNTER — Encounter: Payer: Self-pay | Admitting: Medical Oncology

## 2023-11-17 ENCOUNTER — Inpatient Hospital Stay: Attending: Hematology & Oncology

## 2023-11-17 VITALS — BP 136/56 | HR 54 | Resp 18

## 2023-11-17 VITALS — BP 128/53 | HR 52 | Temp 98.2°F | Resp 18 | Ht 69.0 in | Wt 179.4 lb

## 2023-11-17 DIAGNOSIS — Z5112 Encounter for antineoplastic immunotherapy: Secondary | ICD-10-CM | POA: Insufficient documentation

## 2023-11-17 DIAGNOSIS — D649 Anemia, unspecified: Secondary | ICD-10-CM | POA: Diagnosis not present

## 2023-11-17 DIAGNOSIS — C9000 Multiple myeloma not having achieved remission: Secondary | ICD-10-CM

## 2023-11-17 DIAGNOSIS — D509 Iron deficiency anemia, unspecified: Secondary | ICD-10-CM | POA: Insufficient documentation

## 2023-11-17 DIAGNOSIS — Q999 Chromosomal abnormality, unspecified: Secondary | ICD-10-CM | POA: Diagnosis not present

## 2023-11-17 DIAGNOSIS — Z79899 Other long term (current) drug therapy: Secondary | ICD-10-CM | POA: Insufficient documentation

## 2023-11-17 DIAGNOSIS — D500 Iron deficiency anemia secondary to blood loss (chronic): Secondary | ICD-10-CM

## 2023-11-17 DIAGNOSIS — R799 Abnormal finding of blood chemistry, unspecified: Secondary | ICD-10-CM

## 2023-11-17 LAB — CMP (CANCER CENTER ONLY)
ALT: 19 U/L (ref 0–44)
AST: 19 U/L (ref 15–41)
Albumin: 4.1 g/dL (ref 3.5–5.0)
Alkaline Phosphatase: 49 U/L (ref 38–126)
Anion gap: 7 (ref 5–15)
BUN: 16 mg/dL (ref 8–23)
CO2: 26 mmol/L (ref 22–32)
Calcium: 8.8 mg/dL — ABNORMAL LOW (ref 8.9–10.3)
Chloride: 105 mmol/L (ref 98–111)
Creatinine: 1.18 mg/dL (ref 0.61–1.24)
GFR, Estimated: >60 mL/min (ref >60)
Glucose, Bld: 225 mg/dL — ABNORMAL HIGH (ref 70–99)
Potassium: 4 mmol/L (ref 3.5–5.1)
Sodium: 138 mmol/L (ref 135–145)
Total Bilirubin: 0.4 mg/dL (ref 0.0–1.2)
Total Protein: 6.6 g/dL (ref 6.5–8.1)

## 2023-11-17 LAB — CBC WITH DIFFERENTIAL (CANCER CENTER ONLY)
Abs Immature Granulocytes: 0.01 10*3/uL (ref 0.00–0.07)
Basophils Absolute: 0 10*3/uL (ref 0.0–0.1)
Basophils Relative: 1 %
Eosinophils Absolute: 0.2 10*3/uL (ref 0.0–0.5)
Eosinophils Relative: 3 %
HCT: 30.7 % — ABNORMAL LOW (ref 39.0–52.0)
Hemoglobin: 9.5 g/dL — ABNORMAL LOW (ref 13.0–17.0)
Immature Granulocytes: 0 %
Lymphocytes Relative: 27 %
Lymphs Abs: 1.2 10*3/uL (ref 0.7–4.0)
MCH: 25.3 pg — ABNORMAL LOW (ref 26.0–34.0)
MCHC: 30.9 g/dL (ref 30.0–36.0)
MCV: 81.9 fL (ref 80.0–100.0)
Monocytes Absolute: 0.8 10*3/uL (ref 0.1–1.0)
Monocytes Relative: 17 %
Neutro Abs: 2.2 10*3/uL (ref 1.7–7.7)
Neutrophils Relative %: 52 %
Platelet Count: 101 10*3/uL — ABNORMAL LOW (ref 150–400)
RBC: 3.75 MIL/uL — ABNORMAL LOW (ref 4.22–5.81)
RDW: 15.5 % (ref 11.5–15.5)
WBC Count: 4.4 10*3/uL (ref 4.0–10.5)
nRBC: 0 % (ref 0.0–0.2)

## 2023-11-17 LAB — HEMOGLOBIN A1C
Hgb A1c MFr Bld: 7.3 % — ABNORMAL HIGH (ref 4.8–5.6)
Mean Plasma Glucose: 162.81 mg/dL

## 2023-11-17 LAB — LACTATE DEHYDROGENASE: LDH: 133 U/L (ref 98–192)

## 2023-11-17 MED ORDER — DARATUMUMAB-HYALURONIDASE-FIHJ 1800-30000 MG-UT/15ML ~~LOC~~ SOLN
1800.0000 mg | Freq: Once | SUBCUTANEOUS | Status: AC
  Administered 2023-11-17: 1800 mg via SUBCUTANEOUS
  Filled 2023-11-17: qty 15

## 2023-11-17 MED ORDER — BORTEZOMIB CHEMO SQ INJECTION 3.5 MG (2.5MG/ML)
1.3000 mg/m2 | Freq: Once | INTRAMUSCULAR | Status: AC
  Administered 2023-11-17: 2.5 mg via SUBCUTANEOUS
  Filled 2023-11-17: qty 1

## 2023-11-17 MED ORDER — DIPHENHYDRAMINE HCL 25 MG PO CAPS: 50.0000 mg | ORAL_CAPSULE | Freq: Once | ORAL | Status: DC

## 2023-11-17 MED ORDER — ACETAMINOPHEN 325 MG PO TABS: 650.0000 mg | ORAL_TABLET | Freq: Once | ORAL | Status: DC

## 2023-11-17 NOTE — Patient Instructions (Signed)
 [facility] - A DEPT OF [facility]  Discharge Instructions: Thank you for choosing [facility] to provide your oncology and hematology care.   If you have a lab appointment with the Cancer Center, please go directly to the Cancer Center and check in at the registration area.  Wear comfortable clothing and clothing appropriate for easy access to any Portacath or PICC line.   We strive to give you quality time with your provider. You may need to reschedule your appointment if you arrive late (15 or more minutes).  Arriving late affects you and other patients whose appointments are after yours.  Also, if you miss three or more appointments without notifying the office, you may be dismissed from the clinic at the provider's discretion.      For prescription refill requests, have your pharmacy contact our office and allow 72 hours for refills to be completed.    Today you received the following chemotherapy and/or immunotherapy agents Darzalex -Faspro, Velcade .      To help prevent nausea and vomiting after your treatment, we encourage you to take your nausea medication as directed.  BELOW ARE SYMPTOMS THAT SHOULD BE REPORTED IMMEDIATELY: *FEVER GREATER THAN 100.4 F (38 C) OR HIGHER *CHILLS OR SWEATING *NAUSEA AND VOMITING THAT IS NOT CONTROLLED WITH YOUR NAUSEA MEDICATION *UNUSUAL SHORTNESS OF BREATH *UNUSUAL BRUISING OR BLEEDING *URINARY PROBLEMS (pain or burning when urinating, or frequent urination) *BOWEL PROBLEMS (unusual diarrhea, constipation, pain near the anus) TENDERNESS IN MOUTH AND THROAT WITH OR WITHOUT PRESENCE OF ULCERS (sore throat, sores in mouth, or a toothache) UNUSUAL RASH, SWELLING OR PAIN  UNUSUAL VAGINAL DISCHARGE OR ITCHING   Items with * indicate a potential emergency and should be followed up as soon as possible or go to the Emergency Department if any problems should occur.  Please show the CHEMOTHERAPY ALERT CARD or  IMMUNOTHERAPY ALERT CARD at check-in to the Emergency Department and triage nurse. Should you have questions after your visit or need to cancel or reschedule your appointment, please contact [facility] - A DEPT OF [facility]  [phone] and follow the prompts.  Office hours are 8:00 a.m. to 4:30 p.m. Monday - Friday. Please note that voicemails left after 4:00 p.m. may not be returned until the following business day.  We are closed weekends and major holidays. You have access to a nurse at all times for urgent questions. Please call the main number to the clinic [phone] and follow the prompts.  For any non-urgent questions, you may also contact your provider using MyChart. We now offer e-Visits for anyone 23 and older to request care online for non-urgent symptoms. For details visit [email or web address].   Also download the MyChart app! Go to the app store, search "MyChart", open the app, select [facility], and log in with your MyChart username and password.

## 2023-11-17 NOTE — Progress Notes (Signed)
 Hematology and Oncology Follow Up Visit  Elizjah Noblet [id number] 15-May-1946 78 y.o. 11/17/2023   Principle Diagnosis:   IgG kappa multiple myeloma - FISH (-)/ Trisomy 11 Incidental exophytic lesion of R kidney Intermittent iron deficiency anemia   Current Therapy:   DVR -- s/p cycle #8 -- start on 03/29/2023 -will need to be taking 2 weeks on and 2 weeks off starting 05/06/2023 IV iron  - Ferahame given on 12/29/2021 325 mg EC ASA   Interim History:  Mr. Kolbeck is here today for follow-up. He states that he is feeling really well. Played 18 holes of golf yesterday. No dizziness, fatigue, recent illness.   As far as his myeloma is concerned, he has been doing pretty well.  His last monoclonal spike was 1.0 g/dL.  The Ig G level was 1622 mg/dL.  His Kappa light chain was 7.6 mg/dL.  He has had no problems with neuropathy. He has had no cough or shortness of breath.  He has had no diarrhea.  He is little constipated.  He has had no rashes.  There is been no bleeding.  He has had no leg swelling.  Overall, I would say that his performance status is probably ECOG 1.    Wt Readings from Last 3 Encounters:  11/17/23 179 lb 6.4 oz (81.4 kg)  10/20/23 179 lb (81.2 kg)  09/29/23 176 lb (79.8 kg)    Medications:  Allergies as of 11/17/2023       Reactions   Clindamycin Hcl Itching   Famvir  [famciclovir ] Itching   Metoprolol  Succinate Other (See Comments)   Pt does not recall.        Medication List        Accurate as of Nov 17, 2023  9:19 AM. If you have any questions, ask your nurse or doctor.          STOP taking these medications    amLODipine 10 MG tablet Commonly known as: NORVASC Stopped by: Sharla Davis       TAKE these medications    aspirin  EC 325 MG tablet Take 325 mg by mouth daily.   cetirizine 10 MG chewable tablet Commonly known as: ZYRTEC Chew 10 mg by mouth daily.   ezetimibe 10 MG tablet Commonly known as: ZETIA Take 10 mg by mouth  daily.   glipiZIDE 2.5 MG 24 hr tablet Commonly known as: GLUCOTROL XL Take 2.5 mg by mouth daily.   Jardiance 25 MG Tabs tablet Generic drug: empagliflozin Take 25 mg by mouth daily.   lenalidomide  15 MG capsule Commonly known as: REVLIMID  Take one tablet daily for three weeks (21 days) and then off for one week (7 days)-[employer] Auth # [id number]     Date Obtained 10/15/2023 What changed: additional instructions   Mounjaro 5 MG/0.5ML Pen Generic drug: tirzepatide Inject 2.5 mg into the skin once a week.   omeprazole  20 MG capsule Commonly known as: PRILOSEC Take 20 mg by mouth 2 (two) times daily before a meal.   ondansetron  8 MG tablet Commonly known as: Zofran  Take 1 tablet (8 mg total) by mouth every 8 (eight) hours as needed for nausea or vomiting.   rosuvastatin  40 MG tablet Commonly known as: CRESTOR  Take 40 mg by mouth daily.   Testosterone 25 MG/2.5GM (1%) Gel Place 1 mg onto the skin every other day.   triamterene-hydrochlorothiazide 37.5-25 MG tablet Commonly known as: MAXZIDE-25 Take 1 tablet by mouth every morning.   valsartan 320 MG tablet Commonly known  as: DIOVAN Take 320 mg by mouth daily.        Allergies:  Allergies  Allergen Reactions   Clindamycin Hcl Itching   Famvir  [Famciclovir ] Itching   Metoprolol  Succinate Other (See Comments)    Pt does not recall.    Past Medical History, Surgical history, Social history, and Family History were reviewed and updated.  Review of Systems: Review of Systems  Constitutional: Negative.   HENT: Negative.    Eyes: Negative.   Respiratory: Negative.    Cardiovascular: Negative.   Gastrointestinal: Negative.   Genitourinary: Negative.   Musculoskeletal: Negative.   Skin: Negative.   Neurological: Negative.   Endo/Heme/Allergies: Negative.   Psychiatric/Behavioral: Negative.        Physical Exam:  height is 5\' 9"  (1.753 m) and weight is 179 lb 6.4 oz (81.4 kg). His oral temperature is 98.2 F  (36.8 C). His blood pressure is 128/53 (abnormal) and his pulse is 52 (abnormal). His respiration is 18 and oxygen saturation is 100%.    Wt Readings from Last 3 Encounters:  11/17/23 179 lb 6.4 oz (81.4 kg)  10/20/23 179 lb (81.2 kg)  09/29/23 176 lb (79.8 kg)    Physical Exam Vitals reviewed.  HENT:     Head: Normocephalic and atraumatic.  Eyes:     Pupils: Pupils are equal, round, and reactive to light.  Cardiovascular:     Rate and Rhythm: Normal rate and regular rhythm.     Heart sounds: Normal heart sounds.  Pulmonary:     Effort: Pulmonary effort is normal.     Breath sounds: Normal breath sounds.  Abdominal:     General: Bowel sounds are normal.     Palpations: Abdomen is soft.  Musculoskeletal:        General: No tenderness or deformity. Normal range of motion.     Cervical back: Normal range of motion.  Lymphadenopathy:     Cervical: No cervical adenopathy.  Skin:    General: Skin is warm and dry.     Findings: No erythema or rash.  Neurological:     Mental Status: He is alert and oriented to person, place, and time.  Psychiatric:        Behavior: Behavior normal.        Thought Content: Thought content normal.        Judgment: Judgment normal.      Lab Results  Component Value Date   WBC 4.4 11/17/2023   HGB 9.5 (L) 11/17/2023   HCT 30.7 (L) 11/17/2023   MCV 81.9 11/17/2023   PLT 101 (L) 11/17/2023   Lab Results  Component Value Date   FERRITIN 42 05/20/2023   IRON 112 05/20/2023   TIBC 430 05/20/2023   UIBC 318 05/20/2023   IRONPCTSAT 26 05/20/2023   Lab Results  Component Value Date   RETICCTPCT 2.3 08/31/2022   RBC 3.75 (L) 11/17/2023   Lab Results  Component Value Date   KPAFRELGTCHN 76.4 (H) 10/20/2023   LAMBDASER 10.9 10/20/2023   KAPLAMBRATIO 7.01 (H) 10/20/2023   Lab Results  Component Value Date   IGGSERUM 1,622 (H) 10/20/2023   IGGSERUM 1,681 (H) 10/20/2023   IGA 32 (L) 10/20/2023   IGA 32 (L) 10/20/2023   IGMSERUM 23  10/20/2023   IGMSERUM 21 10/20/2023   Lab Results  Component Value Date   TOTALPROTELP 6.8 10/20/2023   ALBUMINELP 3.5 10/20/2023   A1GS 0.2 10/20/2023   A2GS 0.7 10/20/2023   BETS 0.9 10/20/2023  GAMS 1.5 10/20/2023   MSPIKE 1.0 (H) 10/20/2023   SPEI Comment 05/20/2023     Chemistry      Component Value Date/Time   NA 138 11/17/2023 0828   NA 139 06/20/2020 0923   K 4.0 11/17/2023 0828   CL 105 11/17/2023 0828   CO2 26 11/17/2023 0828   BUN 16 11/17/2023 0828   BUN 17 06/20/2020 0923   CREATININE 1.18 11/17/2023 0828   CREATININE 1.12 09/03/2015 1201      Component Value Date/Time   CALCIUM  8.8 (L) 11/17/2023 0828   ALKPHOS 49 11/17/2023 0828   AST 19 11/17/2023 0828   ALT 19 11/17/2023 0828   BILITOT 0.4 11/17/2023 0828     No diagnosis found.   Impression and Plan: Mr. Matsuoka is a very pleasant 78 yo caucasian gentleman with IgG kappa multiple myeloma.  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  His FISH panel was negative. Currently he is on daratumumab /Velcade /Revlimid  without Decadron .   He continues to do well.  CBC and CMP reviewed with patient today. Given Hgb dip will check iron and nutritional studies at next visit.   We will go ahead with his 10th cycle of chemotherapy.   RTC 1 month MD, labs, treatment (daratumumab /Velcade /Revlimid  without Decadron )   Sharla Davis, PA-C 5/21/20259:19 AM

## 2023-11-18 LAB — IGG, IGA, IGM
IgA: 32 mg/dL — ABNORMAL LOW (ref 61–437)
IgG (Immunoglobin G), Serum: 1603 mg/dL (ref 603–1613)
IgM (Immunoglobulin M), Srm: 20 mg/dL (ref 15–143)

## 2023-11-18 LAB — KAPPA/LAMBDA LIGHT CHAINS
Kappa free light chain: 82.7 mg/L — ABNORMAL HIGH (ref 3.3–19.4)
Kappa, lambda light chain ratio: 6.67 — ABNORMAL HIGH (ref 0.26–1.65)
Lambda free light chains: 12.4 mg/L (ref 5.7–26.3)

## 2023-11-23 LAB — PROTEIN ELECTROPHORESIS, SERUM, WITH REFLEX
A/G Ratio: 1.1 (ref 0.7–1.7)
Albumin ELP: 3.3 g/dL (ref 2.9–4.4)
Alpha-1-Globulin: 0.2 g/dL (ref 0.0–0.4)
Alpha-2-Globulin: 0.7 g/dL (ref 0.4–1.0)
Beta Globulin: 0.8 g/dL (ref 0.7–1.3)
Gamma Globulin: 1.3 g/dL (ref 0.4–1.8)
Globulin, Total: 3 g/dL (ref 2.2–3.9)
M-Spike, %: 1 g/dL — ABNORMAL HIGH
SPEP Interpretation: 0
Total Protein ELP: 6.3 g/dL (ref 6.0–8.5)

## 2023-11-23 LAB — IMMUNOFIXATION REFLEX, SERUM
IgA: 32 mg/dL — ABNORMAL LOW (ref 61–437)
IgG (Immunoglobin G), Serum: 1621 mg/dL — ABNORMAL HIGH (ref 603–1613)
IgM (Immunoglobulin M), Srm: 21 mg/dL (ref 15–143)

## 2023-11-26 ENCOUNTER — Other Ambulatory Visit: Payer: Self-pay | Admitting: *Deleted

## 2023-11-26 DIAGNOSIS — C9000 Multiple myeloma not having achieved remission: Secondary | ICD-10-CM

## 2023-11-26 MED ORDER — LENALIDOMIDE 15 MG PO CAPS: ORAL_CAPSULE | ORAL | 0 refills | Status: DC

## 2023-11-30 ENCOUNTER — Other Ambulatory Visit (HOSPITAL_COMMUNITY): Payer: Self-pay | Admitting: Family Medicine

## 2023-11-30 DIAGNOSIS — R011 Cardiac murmur, unspecified: Secondary | ICD-10-CM

## 2023-12-15 ENCOUNTER — Inpatient Hospital Stay: Attending: Hematology & Oncology

## 2023-12-15 ENCOUNTER — Inpatient Hospital Stay (HOSPITAL_BASED_OUTPATIENT_CLINIC_OR_DEPARTMENT_OTHER): Admitting: Hematology & Oncology

## 2023-12-15 ENCOUNTER — Encounter: Payer: Self-pay | Admitting: Hematology & Oncology

## 2023-12-15 ENCOUNTER — Inpatient Hospital Stay

## 2023-12-15 VITALS — BP 119/50 | HR 65 | Temp 97.9°F | Resp 17 | Wt 177.0 lb

## 2023-12-15 DIAGNOSIS — Z79899 Other long term (current) drug therapy: Secondary | ICD-10-CM | POA: Diagnosis not present

## 2023-12-15 DIAGNOSIS — D500 Iron deficiency anemia secondary to blood loss (chronic): Secondary | ICD-10-CM

## 2023-12-15 DIAGNOSIS — C9000 Multiple myeloma not having achieved remission: Secondary | ICD-10-CM

## 2023-12-15 DIAGNOSIS — D509 Iron deficiency anemia, unspecified: Secondary | ICD-10-CM | POA: Insufficient documentation

## 2023-12-15 DIAGNOSIS — Z5112 Encounter for antineoplastic immunotherapy: Secondary | ICD-10-CM | POA: Insufficient documentation

## 2023-12-15 DIAGNOSIS — D649 Anemia, unspecified: Secondary | ICD-10-CM

## 2023-12-15 LAB — IRON AND IRON BINDING CAPACITY (CC-WL,HP ONLY)
Iron: 57 ug/dL (ref 45–182)
Saturation Ratios: 11 % — ABNORMAL LOW (ref 17.9–39.5)
TIBC: 517 ug/dL — ABNORMAL HIGH (ref 250–450)
UIBC: 460 ug/dL — ABNORMAL HIGH (ref 117–376)

## 2023-12-15 LAB — CMP (CANCER CENTER ONLY)
ALT: 14 U/L (ref 0–44)
AST: 15 U/L (ref 15–41)
Albumin: 4.1 g/dL (ref 3.5–5.0)
Alkaline Phosphatase: 54 U/L (ref 38–126)
Anion gap: 10 (ref 5–15)
BUN: 22 mg/dL (ref 8–23)
CO2: 26 mmol/L (ref 22–32)
Calcium: 9.1 mg/dL (ref 8.9–10.3)
Chloride: 99 mmol/L (ref 98–111)
Creatinine: 1.22 mg/dL (ref 0.61–1.24)
GFR, Estimated: 60 mL/min — ABNORMAL LOW (ref >60)
Glucose, Bld: 203 mg/dL — ABNORMAL HIGH (ref 70–99)
Potassium: 3.8 mmol/L (ref 3.5–5.1)
Sodium: 135 mmol/L (ref 135–145)
Total Bilirubin: 0.4 mg/dL (ref 0.0–1.2)
Total Protein: 7 g/dL (ref 6.5–8.1)

## 2023-12-15 LAB — CBC WITH DIFFERENTIAL (CANCER CENTER ONLY)
Abs Immature Granulocytes: 0.01 10*3/uL (ref 0.00–0.07)
Basophils Absolute: 0 10*3/uL (ref 0.0–0.1)
Basophils Relative: 1 %
Eosinophils Absolute: 0.4 10*3/uL (ref 0.0–0.5)
Eosinophils Relative: 7 %
HCT: 32.7 % — ABNORMAL LOW (ref 39.0–52.0)
Hemoglobin: 10.1 g/dL — ABNORMAL LOW (ref 13.0–17.0)
Immature Granulocytes: 0 %
Lymphocytes Relative: 24 %
Lymphs Abs: 1.6 10*3/uL (ref 0.7–4.0)
MCH: 24.1 pg — ABNORMAL LOW (ref 26.0–34.0)
MCHC: 30.9 g/dL (ref 30.0–36.0)
MCV: 78 fL — ABNORMAL LOW (ref 80.0–100.0)
Monocytes Absolute: 0.8 10*3/uL (ref 0.1–1.0)
Monocytes Relative: 13 %
Neutro Abs: 3.6 10*3/uL (ref 1.7–7.7)
Neutrophils Relative %: 55 %
Platelet Count: 162 10*3/uL (ref 150–400)
RBC: 4.19 MIL/uL — ABNORMAL LOW (ref 4.22–5.81)
RDW: 16 % — ABNORMAL HIGH (ref 11.5–15.5)
WBC Count: 6.5 10*3/uL (ref 4.0–10.5)
nRBC: 0 % (ref 0.0–0.2)

## 2023-12-15 LAB — FOLATE: Folate: 19.3 ng/mL (ref >5.9)

## 2023-12-15 LAB — FERRITIN: Ferritin: 14 ng/mL — ABNORMAL LOW (ref 24–336)

## 2023-12-15 LAB — LACTATE DEHYDROGENASE: LDH: 130 U/L (ref 98–192)

## 2023-12-15 LAB — VITAMIN B12: Vitamin B-12: 162 pg/mL — ABNORMAL LOW (ref 180–914)

## 2023-12-15 MED ORDER — HYDROXYZINE HCL 10 MG PO TABS: 10.0000 mg | ORAL_TABLET | Freq: Three times a day (TID) | ORAL | 2 refills | Status: DC | PRN

## 2023-12-15 MED ORDER — DIPHENHYDRAMINE HCL 25 MG PO CAPS: 50.0000 mg | ORAL_CAPSULE | Freq: Once | ORAL | Status: DC

## 2023-12-15 MED ORDER — DARATUMUMAB-HYALURONIDASE-FIHJ 1800-30000 MG-UT/15ML ~~LOC~~ SOLN
1800.0000 mg | Freq: Once | SUBCUTANEOUS | Status: AC
  Administered 2023-12-15: 1800 mg via SUBCUTANEOUS
  Filled 2023-12-15: qty 15

## 2023-12-15 MED ORDER — BORTEZOMIB CHEMO SQ INJECTION 3.5 MG (2.5MG/ML)
1.3000 mg/m2 | Freq: Once | INTRAMUSCULAR | Status: AC
  Administered 2023-12-15: 2.5 mg via SUBCUTANEOUS
  Filled 2023-12-15: qty 1

## 2023-12-15 MED ORDER — ACETAMINOPHEN 325 MG PO TABS: 650.0000 mg | ORAL_TABLET | Freq: Once | ORAL | Status: DC

## 2023-12-15 NOTE — Progress Notes (Signed)
 Hematology and Oncology Follow Up Visit  [name] [id number] 11-27-1945 78 y.o. 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  Overall, he seems to be doing pretty well.  He is complaining little bit of shortness of breath.  This may be from him being a little anemic.  We have I will have to check his iron studies.  His myeloma has been holding relatively stable.  His last monoclonal spike was 1 g/dL.  His IgG level was 1610 mg/dL.  His kappa light chain was 8.3 mg/dL.  He has had some pruritus.  This probably is from the Revlimid .  I think we really need to keep the Revlimid  going for right now.  I will put on some Atarax..  He has had no fever.  He has had no bleeding.  He has had no change in bowel or bladder habits.  There is been no leg swelling.  Overall, his performance status is probably ECOG 1.    Wt Readings from Last 3 Encounters:  12/15/23 177 lb (80.3 kg)  11/17/23 179 lb 6.4 oz (81.4 kg)  10/20/23 179 lb (81.2 kg)    Medications:  Allergies as of 12/15/2023       Reactions   Clindamycin Hcl Itching   Famvir  [famciclovir ] Itching   Metoprolol  Succinate Other (See Comments)   Pt does not recall.        Medication List        Accurate as of December 15, 2023  8:10 AM. If you have any questions, ask your nurse or doctor.          aspirin  EC 325 MG tablet Take 325 mg by mouth daily.   cetirizine 10 MG chewable tablet Commonly known as: ZYRTEC Chew 10 mg by mouth daily.   ezetimibe 10 MG tablet Commonly known as: ZETIA Take 10 mg by mouth daily.   glipiZIDE 2.5 MG 24 hr tablet Commonly  known as: GLUCOTROL XL Take 2.5 mg by mouth daily.   Jardiance 25 MG Tabs tablet Generic drug: empagliflozin Take 25 mg by mouth daily.   lenalidomide  15 MG capsule Commonly known as: REVLIMID  Take one tablet daily for two weeks (14days) and then off for two weeks (14 days)-[employer] Auth # [id number]   Date Obtained 11/25/33   Mounjaro 5 MG/0.5ML Pen Generic drug: tirzepatide Inject 2.5 mg into the skin once a week.   omeprazole  20 MG capsule Commonly known as: PRILOSEC Take 20 mg by mouth 2 (two) times daily before a meal.   ondansetron  8 MG tablet Commonly known as: Zofran  Take 1 tablet (8 mg total) by mouth every 8 (eight) hours as needed for nausea or vomiting.   rosuvastatin  40 MG tablet Commonly known as: CRESTOR  Take 40 mg by mouth daily.   Testosterone 25 MG/2.5GM (1%) Gel Place 1 mg onto the skin every other day.   triamterene-hydrochlorothiazide 37.5-25 MG tablet Commonly known as: MAXZIDE-25 Take 1 tablet by mouth every morning.   valsartan 320 MG tablet Commonly known as: DIOVAN Take 320 mg by mouth daily.  Allergies:  Allergies  Allergen Reactions   Clindamycin Hcl Itching   Famvir  [Famciclovir ] Itching   Metoprolol  Succinate Other (See Comments)    Pt does not recall.    Past Medical History, Surgical history, Social history, and Family History were reviewed and updated.  Review of Systems: Review of Systems  Constitutional: Negative.   HENT: Negative.    Eyes: Negative.   Respiratory: Negative.    Cardiovascular: Negative.   Gastrointestinal: Negative.   Genitourinary: Negative.   Musculoskeletal: Negative.   Skin: Negative.   Neurological: Negative.   Endo/Heme/Allergies: Negative.   Psychiatric/Behavioral: Negative.        Physical Exam:  weight is 177 lb (80.3 kg). His oral temperature is 97.9 F (36.6 C). His blood pressure is 119/50 (abnormal) and his pulse is 65. His respiration is 17 and oxygen saturation is 98%.    Wt  Readings from Last 3 Encounters:  12/15/23 177 lb (80.3 kg)  11/17/23 179 lb 6.4 oz (81.4 kg)  10/20/23 179 lb (81.2 kg)    Physical Exam Vitals reviewed.  HENT:     Head: Normocephalic and atraumatic.   Eyes:     Pupils: Pupils are equal, round, and reactive to light.    Cardiovascular:     Rate and Rhythm: Normal rate and regular rhythm.     Heart sounds: Normal heart sounds.  Pulmonary:     Effort: Pulmonary effort is normal.     Breath sounds: Normal breath sounds.  Abdominal:     General: Bowel sounds are normal.     Palpations: Abdomen is soft.   Musculoskeletal:        General: No tenderness or deformity. Normal range of motion.     Cervical back: Normal range of motion.  Lymphadenopathy:     Cervical: No cervical adenopathy.   Skin:    General: Skin is warm and dry.     Findings: No erythema or rash.   Neurological:     Mental Status: He is alert and oriented to person, place, and time.   Psychiatric:        Behavior: Behavior normal.        Thought Content: Thought content normal.        Judgment: Judgment normal.      Lab Results  Component Value Date   WBC 6.5 12/15/2023   HGB 10.1 (L) 12/15/2023   HCT 32.7 (L) 12/15/2023   MCV 78.0 (L) 12/15/2023   PLT 162 12/15/2023   Lab Results  Component Value Date   FERRITIN 42 05/20/2023   IRON 112 05/20/2023   TIBC 430 05/20/2023   UIBC 318 05/20/2023   IRONPCTSAT 26 05/20/2023   Lab Results  Component Value Date   RETICCTPCT 2.3 08/31/2022   RBC 4.19 (L) 12/15/2023   Lab Results  Component Value Date   KPAFRELGTCHN 82.7 (H) 11/17/2023   LAMBDASER 12.4 11/17/2023   KAPLAMBRATIO 6.67 (H) 11/17/2023   Lab Results  Component Value Date   IGGSERUM 1,603 11/17/2023   IGGSERUM 1,621 (H) 11/17/2023   IGA 32 (L) 11/17/2023   IGA 32 (L) 11/17/2023   IGMSERUM 20 11/17/2023   IGMSERUM 21 11/17/2023   Lab Results  Component Value Date   TOTALPROTELP 6.3 11/17/2023   ALBUMINELP 3.3 11/17/2023    A1GS 0.2 11/17/2023   A2GS 0.7 11/17/2023   BETS 0.8 11/17/2023   GAMS 1.3 11/17/2023   MSPIKE 1.0 (H) 11/17/2023   SPEI Comment 05/20/2023     Chemistry  Component Value Date/Time   NA 138 11/17/2023 0828   NA 139 06/20/2020 0923   K 4.0 11/17/2023 0828   CL 105 11/17/2023 0828   CO2 26 11/17/2023 0828   BUN 16 11/17/2023 0828   BUN 17 06/20/2020 0923   CREATININE 1.18 11/17/2023 0828   CREATININE 1.12 09/03/2015 1201      Component Value Date/Time   CALCIUM  8.8 (L) 11/17/2023 0828   ALKPHOS 49 11/17/2023 0828   AST 19 11/17/2023 0828   ALT 19 11/17/2023 0828   BILITOT 0.4 11/17/2023 0828      Impression and Plan: [name] is a very pleasant 78 yo caucasian gentleman with IgG kappa multiple myeloma.  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  His FISH panel was negative. Currently he is on daratumumab /Velcade /Revlimid  without Decadron .   He continues to do well.  Again, he has pruritus.  The shortness of breath concern would be coming from his anemia.  We will go ahead with his 11th cycle of treatment.  He is going in treatment once a month.  I think this is very reasonable.  I will see him back in 1 month.    Ivor Mars, MD 6/18/20258:10 AM

## 2023-12-15 NOTE — Patient Instructions (Signed)
 [facility] - A DEPT OF [facility]  Discharge Instructions: Thank you for choosing [facility] to provide your oncology and hematology care.   If you have a lab appointment with the Cancer Center, please go directly to the Cancer Center and check in at the registration area.  Wear comfortable clothing and clothing appropriate for easy access to any Portacath or PICC line.   We strive to give you quality time with your provider. You may need to reschedule your appointment if you arrive late (15 or more minutes).  Arriving late affects you and other patients whose appointments are after yours.  Also, if you miss three or more appointments without notifying the office, you may be dismissed from the clinic at the provider's discretion.      For prescription refill requests, have your pharmacy contact our office and allow 72 hours for refills to be completed.    Today you received the following chemotherapy and/or immunotherapy agents Darzalex -Faspro, Velcade .      To help prevent nausea and vomiting after your treatment, we encourage you to take your nausea medication as directed.  BELOW ARE SYMPTOMS THAT SHOULD BE REPORTED IMMEDIATELY: *FEVER GREATER THAN 100.4 F (38 C) OR HIGHER *CHILLS OR SWEATING *NAUSEA AND VOMITING THAT IS NOT CONTROLLED WITH YOUR NAUSEA MEDICATION *UNUSUAL SHORTNESS OF BREATH *UNUSUAL BRUISING OR BLEEDING *URINARY PROBLEMS (pain or burning when urinating, or frequent urination) *BOWEL PROBLEMS (unusual diarrhea, constipation, pain near the anus) TENDERNESS IN MOUTH AND THROAT WITH OR WITHOUT PRESENCE OF ULCERS (sore throat, sores in mouth, or a toothache) UNUSUAL RASH, SWELLING OR PAIN  UNUSUAL VAGINAL DISCHARGE OR ITCHING   Items with * indicate a potential emergency and should be followed up as soon as possible or go to the Emergency Department if any problems should occur.  Please show the CHEMOTHERAPY ALERT CARD or  IMMUNOTHERAPY ALERT CARD at check-in to the Emergency Department and triage nurse. Should you have questions after your visit or need to cancel or reschedule your appointment, please contact [facility] - A DEPT OF [facility]  [phone] and follow the prompts.  Office hours are 8:00 a.m. to 4:30 p.m. Monday - Friday. Please note that voicemails left after 4:00 p.m. may not be returned until the following business day.  We are closed weekends and major holidays. You have access to a nurse at all times for urgent questions. Please call the main number to the clinic [phone] and follow the prompts.  For any non-urgent questions, you may also contact your provider using MyChart. We now offer e-Visits for anyone 23 and older to request care online for non-urgent symptoms. For details visit [email or web address].   Also download the MyChart app! Go to the app store, search "MyChart", open the app, select [facility], and log in with your MyChart username and password.

## 2023-12-16 LAB — KAPPA/LAMBDA LIGHT CHAINS
Kappa free light chain: 91.1 mg/L — ABNORMAL HIGH (ref 3.3–19.4)
Kappa, lambda light chain ratio: 8.13 — ABNORMAL HIGH (ref 0.26–1.65)
Lambda free light chains: 11.2 mg/L (ref 5.7–26.3)

## 2023-12-17 LAB — MULTIPLE MYELOMA PANEL, SERUM
Albumin SerPl Elph-Mcnc: 3.4 g/dL (ref 2.9–4.4)
Albumin/Glob SerPl: 1.1 (ref 0.7–1.7)
Alpha 1: 0.2 g/dL (ref 0.0–0.4)
Alpha2 Glob SerPl Elph-Mcnc: 0.8 g/dL (ref 0.4–1.0)
B-Globulin SerPl Elph-Mcnc: 0.9 g/dL (ref 0.7–1.3)
Gamma Glob SerPl Elph-Mcnc: 1.3 g/dL (ref 0.4–1.8)
Globulin, Total: 3.2 g/dL (ref 2.2–3.9)
IgA: 29 mg/dL — ABNORMAL LOW (ref 61–437)
IgG (Immunoglobin G), Serum: 1468 mg/dL (ref 603–1613)
IgM (Immunoglobulin M), Srm: 21 mg/dL (ref 15–143)
M Protein SerPl Elph-Mcnc: 0.9 g/dL — ABNORMAL HIGH
Total Protein ELP: 6.6 g/dL (ref 6.0–8.5)

## 2023-12-24 ENCOUNTER — Other Ambulatory Visit: Payer: Self-pay | Admitting: *Deleted

## 2023-12-24 DIAGNOSIS — C9000 Multiple myeloma not having achieved remission: Secondary | ICD-10-CM

## 2023-12-24 MED ORDER — LENALIDOMIDE 15 MG PO CAPS: ORAL_CAPSULE | ORAL | 0 refills | Status: DC

## 2024-01-12 ENCOUNTER — Encounter: Payer: Self-pay | Admitting: Hematology & Oncology

## 2024-01-12 ENCOUNTER — Inpatient Hospital Stay: Admitting: Licensed Clinical Social Worker

## 2024-01-12 ENCOUNTER — Inpatient Hospital Stay (HOSPITAL_BASED_OUTPATIENT_CLINIC_OR_DEPARTMENT_OTHER): Admitting: Hematology & Oncology

## 2024-01-12 ENCOUNTER — Inpatient Hospital Stay

## 2024-01-12 ENCOUNTER — Other Ambulatory Visit

## 2024-01-12 ENCOUNTER — Ambulatory Visit: Payer: Self-pay | Admitting: Hematology & Oncology

## 2024-01-12 ENCOUNTER — Inpatient Hospital Stay: Attending: Hematology & Oncology

## 2024-01-12 VITALS — BP 119/55 | HR 60 | Temp 97.8°F | Resp 18 | Ht 69.0 in | Wt 174.4 lb

## 2024-01-12 DIAGNOSIS — R6882 Decreased libido: Secondary | ICD-10-CM | POA: Diagnosis not present

## 2024-01-12 DIAGNOSIS — Z79899 Other long term (current) drug therapy: Secondary | ICD-10-CM | POA: Diagnosis not present

## 2024-01-12 DIAGNOSIS — C9000 Multiple myeloma not having achieved remission: Secondary | ICD-10-CM | POA: Diagnosis not present

## 2024-01-12 DIAGNOSIS — Z7961 Long term (current) use of immunomodulator: Secondary | ICD-10-CM | POA: Diagnosis not present

## 2024-01-12 DIAGNOSIS — D509 Iron deficiency anemia, unspecified: Secondary | ICD-10-CM | POA: Diagnosis not present

## 2024-01-12 DIAGNOSIS — Z5112 Encounter for antineoplastic immunotherapy: Secondary | ICD-10-CM | POA: Insufficient documentation

## 2024-01-12 LAB — RETICULOCYTES
Immature Retic Fract: 12.9 % (ref 2.3–15.9)
RBC.: 4.34 MIL/uL (ref 4.22–5.81)
Retic Count, Absolute: 47.7 K/uL (ref 19.0–186.0)
Retic Ct Pct: 1.1 % (ref 0.4–3.1)

## 2024-01-12 LAB — IRON AND IRON BINDING CAPACITY (CC-WL,HP ONLY)
Iron: 69 ug/dL (ref 45–182)
Saturation Ratios: 13 % — ABNORMAL LOW (ref 17.9–39.5)
TIBC: 545 ug/dL — ABNORMAL HIGH (ref 250–450)
UIBC: 476 ug/dL — ABNORMAL HIGH (ref 117–376)

## 2024-01-12 LAB — CBC WITH DIFFERENTIAL (CANCER CENTER ONLY)
Abs Immature Granulocytes: 0.02 K/uL (ref 0.00–0.07)
Basophils Absolute: 0 K/uL (ref 0.0–0.1)
Basophils Relative: 1 %
Eosinophils Absolute: 0.3 K/uL (ref 0.0–0.5)
Eosinophils Relative: 5 %
HCT: 34.1 % — ABNORMAL LOW (ref 39.0–52.0)
Hemoglobin: 10.4 g/dL — ABNORMAL LOW (ref 13.0–17.0)
Immature Granulocytes: 0 %
Lymphocytes Relative: 26 %
Lymphs Abs: 1.6 K/uL (ref 0.7–4.0)
MCH: 23.9 pg — ABNORMAL LOW (ref 26.0–34.0)
MCHC: 30.5 g/dL (ref 30.0–36.0)
MCV: 78.4 fL — ABNORMAL LOW (ref 80.0–100.0)
Monocytes Absolute: 0.7 K/uL (ref 0.1–1.0)
Monocytes Relative: 11 %
Neutro Abs: 3.4 K/uL (ref 1.7–7.7)
Neutrophils Relative %: 57 %
Platelet Count: 162 K/uL (ref 150–400)
RBC: 4.35 MIL/uL (ref 4.22–5.81)
RDW: 16 % — ABNORMAL HIGH (ref 11.5–15.5)
WBC Count: 6 K/uL (ref 4.0–10.5)
nRBC: 0 % (ref 0.0–0.2)

## 2024-01-12 LAB — LACTATE DEHYDROGENASE: LDH: 125 U/L (ref 98–192)

## 2024-01-12 LAB — CMP (CANCER CENTER ONLY)
ALT: 13 U/L (ref 0–44)
AST: 14 U/L — ABNORMAL LOW (ref 15–41)
Albumin: 4.2 g/dL (ref 3.5–5.0)
Alkaline Phosphatase: 53 U/L (ref 38–126)
Anion gap: 10 (ref 5–15)
BUN: 21 mg/dL (ref 8–23)
CO2: 27 mmol/L (ref 22–32)
Calcium: 9.6 mg/dL (ref 8.9–10.3)
Chloride: 101 mmol/L (ref 98–111)
Creatinine: 1.13 mg/dL (ref 0.61–1.24)
GFR, Estimated: >60 mL/min (ref >60)
Glucose, Bld: 200 mg/dL — ABNORMAL HIGH (ref 70–99)
Potassium: 3.7 mmol/L (ref 3.5–5.1)
Sodium: 138 mmol/L (ref 135–145)
Total Bilirubin: 0.4 mg/dL (ref 0.0–1.2)
Total Protein: 7.1 g/dL (ref 6.5–8.1)

## 2024-01-12 LAB — FERRITIN: Ferritin: 13 ng/mL — ABNORMAL LOW (ref 24–336)

## 2024-01-12 MED ORDER — DIPHENHYDRAMINE HCL 25 MG PO CAPS: 50.0000 mg | ORAL_CAPSULE | Freq: Once | ORAL | Status: DC

## 2024-01-12 MED ORDER — DARATUMUMAB-HYALURONIDASE-FIHJ 1800-30000 MG-UT/15ML ~~LOC~~ SOLN
1800.0000 mg | Freq: Once | SUBCUTANEOUS | Status: AC
  Administered 2024-01-12: 1800 mg via SUBCUTANEOUS
  Filled 2024-01-12: qty 15

## 2024-01-12 MED ORDER — ACCRUFER 30 MG PO CAPS: 30.0000 mg | ORAL_CAPSULE | Freq: Two times a day (BID) | ORAL | 4 refills | Status: DC

## 2024-01-12 MED ORDER — ACETAMINOPHEN 325 MG PO TABS
650.0000 mg | ORAL_TABLET | Freq: Once | ORAL | Status: DC
Start: 2024-01-12 — End: 2024-01-12

## 2024-01-12 MED ORDER — BORTEZOMIB CHEMO SQ INJECTION 3.5 MG (2.5MG/ML)
1.3000 mg/m2 | Freq: Once | INTRAMUSCULAR | Status: AC
  Administered 2024-01-12: 2.5 mg via SUBCUTANEOUS
  Filled 2024-01-12: qty 1

## 2024-01-12 NOTE — Progress Notes (Signed)
 [facility] CSW Progress Note  Visual merchandiser met with patient to follow-up on emotional support.    Interventions: Provided brief mental health counseling with regard to adjusting to illness.  Patient continues enjoying his vacations.  He displayed a bright affect and answered questions appropriately.      Follow Up Plan:  CSW will follow-up with patient by phone     Bob CHRISTELLA Au, LCSW Clinical Social Worker [facility]

## 2024-01-12 NOTE — Progress Notes (Signed)
 Hematology and Oncology Follow Up Visit  [name] [id number] 06/21/1946 78 y.o. 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  He had a very nice trip down to the [place].  He enjoyed himself down there.  He ate quite a lot of good food.  His myeloma has been doing quite nicely.  When we last checked, his monoclonal spike was 0.9 g/dL.  His IgG level was 1468 mg/dL.  His kappa light chain was 9.1 mg/dL.  This has been the level that has been going up slowly.  We are to have to watch this closely.  His main complaint however has been lack of libido.  He is on testosterone  injections.  I suppose we should probably check a testosterone  level on him.  I encouraged him to talk to his Urologist about this problem.  He has seen urology before.  He has had no problems with hot flashes.  He does have some sweats.  He does not complain of any pruritus.  He is on Revlimid .  Of note, when we last saw him, his iron studies were on the low side.  His ferritin was only 14 with iron saturation of 11%..  I will try him on one of the new oral iron preparations.  We will see if this might help him.  Currently, I would have to say that his performance status is probably ECOG 1.     Wt Readings from Last 3 Encounters:  01/12/24 174 lb 6.4 oz (79.1 kg)  12/15/23 177 lb (80.3 kg)  11/17/23 179 lb 6.4 oz (81.4 kg)    Medications:  Allergies as of 01/12/2024       Reactions   Clindamycin Hcl Itching   Famvir  [famciclovir ] Itching   Metoprolol  Succinate Other (See Comments)   Pt does not recall.        Medication List         Accurate as of January 12, 2024  8:38 AM. If you have any questions, ask your nurse or doctor.          aspirin  EC 325 MG tablet Take 325 mg by mouth daily.   cetirizine 10 MG chewable tablet Commonly known as: ZYRTEC Chew 10 mg by mouth daily.   diphenhydrAMINE  25 mg capsule Commonly known as: BENADRYL  Take 50-75 mg by mouth every 6 (six) hours as needed (before tx).   docusate sodium  100 MG capsule Commonly known as: COLACE Take 100 mg by mouth daily as needed for mild constipation.   ezetimibe 10 MG tablet Commonly known as: ZETIA Take 10 mg by mouth daily.   glipiZIDE 2.5 MG 24 hr tablet Commonly known as: GLUCOTROL XL Take 2.5 mg by mouth daily.   hydrOXYzine  10 MG tablet Commonly known as: ATARAX  Take 1 tablet (10 mg total) by mouth every 8 (eight) hours as needed.   Jardiance 25 MG Tabs tablet Generic drug: empagliflozin Take 25 mg by mouth daily.   lenalidomide  15 MG capsule Commonly known as: REVLIMID  Take one tablet daily for two weeks (14days) and then off for two weeks (  14 days)-[employer] Auth # [id number]   Mounjaro 7.5 MG/0.5ML Pen Generic drug: tirzepatide Inject 7.5 mg into the skin once a week. What changed: Another medication with the same name was removed. Continue taking this medication, and follow the directions you see here. Changed by: Maude JONELLE Crease   omeprazole  20 MG capsule Commonly known as: PRILOSEC Take 20 mg by mouth 2 (two) times daily before a meal.   ondansetron  8 MG tablet Commonly known as: Zofran  Take 1 tablet (8 mg total) by mouth every 8 (eight) hours as needed for nausea or vomiting.   rosuvastatin  40 MG tablet Commonly known as: CRESTOR  Take 40 mg by mouth daily.   Testosterone  25 MG/2.5GM (1%) Gel Place 1 mg onto the skin every other day.   triamterene-hydrochlorothiazide 37.5-25 MG tablet Commonly known as: MAXZIDE-25 Take 1 tablet by mouth every morning.   valsartan 320 MG tablet Commonly known as:  DIOVAN Take 320 mg by mouth daily.        Allergies:  Allergies  Allergen Reactions   Clindamycin Hcl Itching   Famvir  [Famciclovir ] Itching   Metoprolol  Succinate Other (See Comments)    Pt does not recall.    Past Medical History, Surgical history, Social history, and Family History were reviewed and updated.  Review of Systems: Review of Systems  Constitutional: Negative.   HENT: Negative.    Eyes: Negative.   Respiratory: Negative.    Cardiovascular: Negative.   Gastrointestinal: Negative.   Genitourinary: Negative.   Musculoskeletal: Negative.   Skin: Negative.   Neurological: Negative.   Endo/Heme/Allergies: Negative.   Psychiatric/Behavioral: Negative.        Physical Exam:  height is 5' 9 (1.753 m) and weight is 174 lb 6.4 oz (79.1 kg). His oral temperature is 97.8 F (36.6 C). His blood pressure is 119/55 (abnormal) and his pulse is 60. His respiration is 18 and oxygen saturation is 100%.    Wt Readings from Last 3 Encounters:  01/12/24 174 lb 6.4 oz (79.1 kg)  12/15/23 177 lb (80.3 kg)  11/17/23 179 lb 6.4 oz (81.4 kg)    Physical Exam Vitals reviewed.  HENT:     Head: Normocephalic and atraumatic.  Eyes:     Pupils: Pupils are equal, round, and reactive to light.  Cardiovascular:     Rate and Rhythm: Normal rate and regular rhythm.     Heart sounds: Normal heart sounds.  Pulmonary:     Effort: Pulmonary effort is normal.     Breath sounds: Normal breath sounds.  Abdominal:     General: Bowel sounds are normal.     Palpations: Abdomen is soft.  Musculoskeletal:        General: No tenderness or deformity. Normal range of motion.     Cervical back: Normal range of motion.  Lymphadenopathy:     Cervical: No cervical adenopathy.  Skin:    General: Skin is warm and dry.     Findings: No erythema or rash.  Neurological:     Mental Status: He is alert and oriented to person, place, and time.  Psychiatric:        Behavior: Behavior normal.         Thought Content: Thought content normal.        Judgment: Judgment normal.      Lab Results  Component Value Date   WBC 6.0 01/12/2024   HGB 10.4 (L) 01/12/2024   HCT 34.1 (L) 01/12/2024   MCV 78.4 (L) 01/12/2024  PLT 162 01/12/2024   Lab Results  Component Value Date   FERRITIN 14 (L) 12/15/2023   IRON 57 12/15/2023   TIBC 517 (H) 12/15/2023   UIBC 460 (H) 12/15/2023   IRONPCTSAT 11 (L) 12/15/2023   Lab Results  Component Value Date   RETICCTPCT 1.1 01/12/2024   RBC 4.34 01/12/2024   Lab Results  Component Value Date   KPAFRELGTCHN 91.1 (H) 12/15/2023   LAMBDASER 11.2 12/15/2023   KAPLAMBRATIO 8.13 (H) 12/15/2023   Lab Results  Component Value Date   IGGSERUM 1,468 12/15/2023   IGA 29 (L) 12/15/2023   IGMSERUM 21 12/15/2023   Lab Results  Component Value Date   TOTALPROTELP 6.6 12/15/2023   ALBUMINELP 3.3 11/17/2023   A1GS 0.2 11/17/2023   A2GS 0.7 11/17/2023   BETS 0.8 11/17/2023   GAMS 1.3 11/17/2023   MSPIKE 1.0 (H) 11/17/2023   SPEI Comment 05/20/2023     Chemistry      Component Value Date/Time   NA 138 01/12/2024 0801   NA 139 06/20/2020 0923   K 3.7 01/12/2024 0801   CL 101 01/12/2024 0801   CO2 27 01/12/2024 0801   BUN 21 01/12/2024 0801   BUN 17 06/20/2020 0923   CREATININE 1.13 01/12/2024 0801   CREATININE 1.12 09/03/2015 1201      Component Value Date/Time   CALCIUM  9.6 01/12/2024 0801   ALKPHOS 53 01/12/2024 0801   AST 14 (L) 01/12/2024 0801   ALT 13 01/12/2024 0801   BILITOT 0.4 01/12/2024 0801      Impression and Plan: [name] is a very pleasant 78 yo caucasian gentleman with IgG kappa multiple myeloma.  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  His FISH panel was negative. Currently he is on daratumumab /Velcade /Revlimid  without Decadron .   Again, I encouraged him to talk to his Urologist about the libido issues.  I am not sure if he will not.  We will see what his testosterone  level is.  We will also see  what his myeloma levels are.  I suppose that there is a treatment protocol could also be affecting the libido.  I do not think this has been a problem in the past for our patients.  However, this can always be an issue.  We will see what the oral iron does for him.  Hopefully, this may help with the anemia and also with his quality of life.  I will plan to get him back to see us  in another month.   Maude JONELLE Crease, MD 7/16/20258:38 AM

## 2024-01-12 NOTE — Patient Instructions (Signed)
 [facility] - A DEPT OF [facility]  Discharge Instructions: Thank you for choosing [facility] to provide your oncology and hematology care.   If you have a lab appointment with the Cancer Center, please go directly to the Cancer Center and check in at the registration area.  Wear comfortable clothing and clothing appropriate for easy access to any Portacath or PICC line.   We strive to give you quality time with your provider. You may need to reschedule your appointment if you arrive late (15 or more minutes).  Arriving late affects you and other patients whose appointments are after yours.  Also, if you miss three or more appointments without notifying the office, you may be dismissed from the clinic at the provider's discretion.      For prescription refill requests, have your pharmacy contact our office and allow 72 hours for refills to be completed.    Today you received the following chemotherapy and/or immunotherapy agents velcade, faspro       To help prevent nausea and vomiting after your treatment, we encourage you to take your nausea medication as directed.  BELOW ARE SYMPTOMS THAT SHOULD BE REPORTED IMMEDIATELY: *FEVER GREATER THAN 100.4 F (38 C) OR HIGHER *CHILLS OR SWEATING *NAUSEA AND VOMITING THAT IS NOT CONTROLLED WITH YOUR NAUSEA MEDICATION *UNUSUAL SHORTNESS OF BREATH *UNUSUAL BRUISING OR BLEEDING *URINARY PROBLEMS (pain or burning when urinating, or frequent urination) *BOWEL PROBLEMS (unusual diarrhea, constipation, pain near the anus) TENDERNESS IN MOUTH AND THROAT WITH OR WITHOUT PRESENCE OF ULCERS (sore throat, sores in mouth, or a toothache) UNUSUAL RASH, SWELLING OR PAIN  UNUSUAL VAGINAL DISCHARGE OR ITCHING   Items with * indicate a potential emergency and should be followed up as soon as possible or go to the Emergency Department if any problems should occur.  Please show the CHEMOTHERAPY ALERT CARD or  IMMUNOTHERAPY ALERT CARD at check-in to the Emergency Department and triage nurse. Should you have questions after your visit or need to cancel or reschedule your appointment, please contact [facility] - A DEPT OF [facility]  [phone] and follow the prompts.  Office hours are 8:00 a.m. to 4:30 p.m. Monday - Friday. Please note that voicemails left after 4:00 p.m. may not be returned until the following business day.  We are closed weekends and major holidays. You have access to a nurse at all times for urgent questions. Please call the main number to the clinic [phone] and follow the prompts.  For any non-urgent questions, you may also contact your provider using MyChart. We now offer e-Visits for anyone 16 and older to request care online for non-urgent symptoms. For details visit [email or web address].   Also download the MyChart app! Go to the app store, search "MyChart", open the app, select [facility], and log in with your MyChart username and password.

## 2024-01-13 ENCOUNTER — Telehealth: Payer: Self-pay

## 2024-01-13 LAB — IGG, IGA, IGM
IgA: 35 mg/dL — ABNORMAL LOW (ref 61–437)
IgG (Immunoglobin G), Serum: 1464 mg/dL (ref 603–1613)
IgM (Immunoglobulin M), Srm: 28 mg/dL (ref 15–143)

## 2024-01-13 LAB — KAPPA/LAMBDA LIGHT CHAINS
Kappa free light chain: 80.8 mg/L — ABNORMAL HIGH (ref 3.3–19.4)
Kappa, lambda light chain ratio: 7.21 — ABNORMAL HIGH (ref 0.26–1.65)
Lambda free light chains: 11.2 mg/L (ref 5.7–26.3)

## 2024-01-13 LAB — TESTOSTERONE: Testosterone: 120 ng/dL — ABNORMAL LOW (ref 264–916)

## 2024-01-13 NOTE — Telephone Encounter (Signed)
 Testosterone  level result was faxed to Dr. Windy per his office telephone request.

## 2024-01-14 LAB — ERYTHROPOIETIN: Erythropoietin: 135.7 m[IU]/mL — ABNORMAL HIGH (ref 2.6–18.5)

## 2024-01-18 ENCOUNTER — Ambulatory Visit (HOSPITAL_COMMUNITY)
Admission: RE | Admit: 2024-01-18 | Discharge: 2024-01-18 | Disposition: A | Discharge status: Home / Self Care | Source: Ambulatory Visit | Attending: Cardiology | Admitting: Cardiology

## 2024-01-18 DIAGNOSIS — R011 Cardiac murmur, unspecified: Secondary | ICD-10-CM | POA: Diagnosis present

## 2024-01-18 LAB — ECHOCARDIOGRAM COMPLETE
Area-P 1/2: 3.12 cm2
S' Lateral: 2.9 cm

## 2024-01-19 LAB — PROTEIN ELECTROPHORESIS, SERUM, WITH REFLEX
A/G Ratio: 1.1 (ref 0.7–1.7)
Albumin ELP: 3.5 g/dL (ref 2.9–4.4)
Alpha-1-Globulin: 0.2 g/dL (ref 0.0–0.4)
Alpha-2-Globulin: 0.8 g/dL (ref 0.4–1.0)
Beta Globulin: 0.9 g/dL (ref 0.7–1.3)
Gamma Globulin: 1.3 g/dL (ref 0.4–1.8)
Globulin, Total: 3.2 g/dL (ref 2.2–3.9)
M-Spike, %: 0.9 g/dL — ABNORMAL HIGH
SPEP Interpretation: 0
Total Protein ELP: 6.7 g/dL (ref 6.0–8.5)

## 2024-01-19 LAB — IMMUNOFIXATION REFLEX, SERUM
IgA: 36 mg/dL — ABNORMAL LOW (ref 61–437)
IgG (Immunoglobin G), Serum: 1587 mg/dL (ref 603–1613)
IgM (Immunoglobulin M), Srm: 23 mg/dL (ref 15–143)

## 2024-01-24 ENCOUNTER — Other Ambulatory Visit: Payer: Self-pay

## 2024-01-24 DIAGNOSIS — C9000 Multiple myeloma not having achieved remission: Secondary | ICD-10-CM

## 2024-01-24 MED ORDER — LENALIDOMIDE 15 MG PO CAPS: ORAL_CAPSULE | ORAL | 0 refills | Status: DC

## 2024-02-09 ENCOUNTER — Inpatient Hospital Stay

## 2024-02-09 ENCOUNTER — Other Ambulatory Visit: Payer: Self-pay

## 2024-02-09 ENCOUNTER — Encounter: Payer: Self-pay | Admitting: Hematology & Oncology

## 2024-02-09 ENCOUNTER — Inpatient Hospital Stay: Attending: Hematology & Oncology

## 2024-02-09 ENCOUNTER — Inpatient Hospital Stay: Admitting: Hematology & Oncology

## 2024-02-09 VITALS — BP 114/55 | HR 69 | Temp 98.2°F | Resp 17 | Ht 69.0 in | Wt 173.0 lb

## 2024-02-09 VITALS — BP 139/65 | HR 60 | Resp 17

## 2024-02-09 DIAGNOSIS — Z79890 Hormone replacement therapy (postmenopausal): Secondary | ICD-10-CM | POA: Insufficient documentation

## 2024-02-09 DIAGNOSIS — C9000 Multiple myeloma not having achieved remission: Secondary | ICD-10-CM | POA: Insufficient documentation

## 2024-02-09 DIAGNOSIS — Z79899 Other long term (current) drug therapy: Secondary | ICD-10-CM | POA: Insufficient documentation

## 2024-02-09 DIAGNOSIS — Z5112 Encounter for antineoplastic immunotherapy: Secondary | ICD-10-CM | POA: Insufficient documentation

## 2024-02-09 DIAGNOSIS — Z7961 Long term (current) use of immunomodulator: Secondary | ICD-10-CM | POA: Diagnosis not present

## 2024-02-09 DIAGNOSIS — R6882 Decreased libido: Secondary | ICD-10-CM

## 2024-02-09 DIAGNOSIS — D509 Iron deficiency anemia, unspecified: Secondary | ICD-10-CM | POA: Diagnosis not present

## 2024-02-09 DIAGNOSIS — D649 Anemia, unspecified: Secondary | ICD-10-CM

## 2024-02-09 LAB — CBC WITH DIFFERENTIAL (CANCER CENTER ONLY)
Abs Immature Granulocytes: 0.01 K/uL (ref 0.00–0.07)
Basophils Absolute: 0 K/uL (ref 0.0–0.1)
Basophils Relative: 0 %
Eosinophils Absolute: 0.3 K/uL (ref 0.0–0.5)
Eosinophils Relative: 7 %
HCT: 34.8 % — ABNORMAL LOW (ref 39.0–52.0)
Hemoglobin: 10.3 g/dL — ABNORMAL LOW (ref 13.0–17.0)
Immature Granulocytes: 0 %
Lymphocytes Relative: 20 %
Lymphs Abs: 1 K/uL (ref 0.7–4.0)
MCH: 23.2 pg — ABNORMAL LOW (ref 26.0–34.0)
MCHC: 29.6 g/dL — ABNORMAL LOW (ref 30.0–36.0)
MCV: 78.4 fL — ABNORMAL LOW (ref 80.0–100.0)
Monocytes Absolute: 0.5 K/uL (ref 0.1–1.0)
Monocytes Relative: 11 %
Neutro Abs: 3 K/uL (ref 1.7–7.7)
Neutrophils Relative %: 62 %
Platelet Count: 149 K/uL — ABNORMAL LOW (ref 150–400)
RBC: 4.44 MIL/uL (ref 4.22–5.81)
RDW: 16.7 % — ABNORMAL HIGH (ref 11.5–15.5)
WBC Count: 4.8 K/uL (ref 4.0–10.5)
nRBC: 0 % (ref 0.0–0.2)

## 2024-02-09 LAB — CMP (CANCER CENTER ONLY)
ALT: 17 U/L (ref 0–44)
AST: 20 U/L (ref 15–41)
Albumin: 4.2 g/dL (ref 3.5–5.0)
Alkaline Phosphatase: 54 U/L (ref 38–126)
Anion gap: 12 (ref 5–15)
BUN: 16 mg/dL (ref 8–23)
CO2: 24 mmol/L (ref 22–32)
Calcium: 9.2 mg/dL (ref 8.9–10.3)
Chloride: 101 mmol/L (ref 98–111)
Creatinine: 1.04 mg/dL (ref 0.61–1.24)
GFR, Estimated: >60 mL/min (ref >60)
Glucose, Bld: 205 mg/dL — ABNORMAL HIGH (ref 70–99)
Potassium: 3.7 mmol/L (ref 3.5–5.1)
Sodium: 137 mmol/L (ref 135–145)
Total Bilirubin: 0.4 mg/dL (ref 0.0–1.2)
Total Protein: 7 g/dL (ref 6.5–8.1)

## 2024-02-09 LAB — FERRITIN: Ferritin: 11 ng/mL — ABNORMAL LOW (ref 24–336)

## 2024-02-09 LAB — IRON AND IRON BINDING CAPACITY (CC-WL,HP ONLY)
Iron: 39 ug/dL — ABNORMAL LOW (ref 45–182)
Saturation Ratios: 7 % — ABNORMAL LOW (ref 17.9–39.5)
TIBC: 564 ug/dL — ABNORMAL HIGH (ref 250–450)
UIBC: 525 ug/dL

## 2024-02-09 LAB — LACTATE DEHYDROGENASE: LDH: 132 U/L (ref 98–192)

## 2024-02-09 MED ORDER — SODIUM CHLORIDE 0.9 % IV SOLN
Freq: Once | INTRAVENOUS | Status: AC
Start: 2024-02-09 — End: 2024-02-09

## 2024-02-09 MED ORDER — ACETAMINOPHEN 325 MG PO TABS: 650.0000 mg | ORAL_TABLET | Freq: Once | ORAL | Status: DC

## 2024-02-09 MED ORDER — SODIUM CHLORIDE 0.9 % IV SOLN
510.0000 mg | Freq: Once | INTRAVENOUS | Status: AC
  Administered 2024-02-09 (×2): 510 mg via INTRAVENOUS
  Filled 2024-02-09: qty 17

## 2024-02-09 MED ORDER — DARATUMUMAB-HYALURONIDASE-FIHJ 1800-30000 MG-UT/15ML ~~LOC~~ SOLN
1800.0000 mg | Freq: Once | SUBCUTANEOUS | Status: AC
  Administered 2024-02-09 (×2): 1800 mg via SUBCUTANEOUS
  Filled 2024-02-09: qty 15

## 2024-02-09 MED ORDER — SODIUM CHLORIDE 0.9% FLUSH
3.0000 mL | Freq: Once | INTRAVENOUS | Status: DC | PRN
Start: 2024-02-09 — End: 2024-02-09

## 2024-02-09 MED ORDER — BORTEZOMIB CHEMO SQ INJECTION 3.5 MG (2.5MG/ML)
1.3000 mg/m2 | Freq: Once | INTRAMUSCULAR | Status: AC
  Administered 2024-02-09 (×2): 2.5 mg via SUBCUTANEOUS
  Filled 2024-02-09: qty 1

## 2024-02-09 MED ORDER — DIPHENHYDRAMINE HCL 25 MG PO CAPS: 50.0000 mg | ORAL_CAPSULE | Freq: Once | ORAL | Status: DC

## 2024-02-09 MED ORDER — SODIUM CHLORIDE 0.9% FLUSH: 10.0000 mL | Freq: Once | INTRAVENOUS | Status: DC | PRN

## 2024-02-09 NOTE — Patient Instructions (Signed)
 [facility] - A DEPT OF [facility]  Discharge Instructions: Thank you for choosing [facility] to provide your oncology and hematology care.   If you have a lab appointment with the Cancer Center, please go directly to the Cancer Center and check in at the registration area.  Wear comfortable clothing and clothing appropriate for easy access to any Portacath or PICC line.   We strive to give you quality time with your provider. You may need to reschedule your appointment if you arrive late (15 or more minutes).  Arriving late affects you and other patients whose appointments are after yours.  Also, if you miss three or more appointments without notifying the office, you may be dismissed from the clinic at the provider's discretion.      For prescription refill requests, have your pharmacy contact our office and allow 72 hours for refills to be completed.    Today you received the following chemotherapy and/or immunotherapy agents Darzalex , Velcade .      To help prevent nausea and vomiting after your treatment, we encourage you to take your nausea medication as directed.  BELOW ARE SYMPTOMS THAT SHOULD BE REPORTED IMMEDIATELY: *FEVER GREATER THAN 100.4 F (38 C) OR HIGHER *CHILLS OR SWEATING *NAUSEA AND VOMITING THAT IS NOT CONTROLLED WITH YOUR NAUSEA MEDICATION *UNUSUAL SHORTNESS OF BREATH *UNUSUAL BRUISING OR BLEEDING *URINARY PROBLEMS (pain or burning when urinating, or frequent urination) *BOWEL PROBLEMS (unusual diarrhea, constipation, pain near the anus) TENDERNESS IN MOUTH AND THROAT WITH OR WITHOUT PRESENCE OF ULCERS (sore throat, sores in mouth, or a toothache) UNUSUAL RASH, SWELLING OR PAIN  UNUSUAL VAGINAL DISCHARGE OR ITCHING   Items with * indicate a potential emergency and should be followed up as soon as possible or go to the Emergency Department if any problems should occur.  Please show the CHEMOTHERAPY ALERT CARD or  IMMUNOTHERAPY ALERT CARD at check-in to the Emergency Department and triage nurse. Should you have questions after your visit or need to cancel or reschedule your appointment, please contact [facility] - A DEPT OF [facility]  [phone] and follow the prompts.  Office hours are 8:00 a.m. to 4:30 p.m. Monday - Friday. Please note that voicemails left after 4:00 p.m. may not be returned until the following business day.  We are closed weekends and major holidays. You have access to a nurse at all times for urgent questions. Please call the main number to the clinic [phone] and follow the prompts.  For any non-urgent questions, you may also contact your provider using MyChart. We now offer e-Visits for anyone 72 and older to request care online for non-urgent symptoms. For details visit [email or web address].   Also download the MyChart app! Go to the app store, search MyChart, open the app, select [facility], and log in with your MyChart username and password.

## 2024-02-09 NOTE — Progress Notes (Signed)
 Hematology and Oncology Follow Up Visit  [name] [id number] [birth year] 78 y.o. 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  He is doing better.  He now is on testosterone  daily.  I think this probably was the reason why he was not feeling all that well and that his testosterone  level was quite low.  His iron level also was low.  Iron saturation was only 13%.  Will have to give him a dose of IV iron.  Otherwise, seems to be doing pretty well.  He has had no problems with cough.  He has had no problems with nausea or vomiting.  There is no change in bowel or bladder habits.  He has had no bleeding.  There is been no leg swelling.  He does have diabetes.  He is off IV medications.  His last myeloma studies showed a monoclonal spike at 0.9 g/dL.  His IgG level was 1500 mg/dL.  The Kappa light chain was 8.1 mg/dL.  Overall, I would say that his performance status is ECOG 1.   Wt Readings from Last 3 Encounters:  02/09/24 173 lb (78.5 kg)  01/12/24 174 lb 6.4 oz (79.1 kg)  12/15/23 177 lb (80.3 kg)    Medications:  Allergies as of 02/09/2024       Reactions   Clindamycin Hcl Itching   Famvir  [famciclovir ] Itching   Metoprolol  Succinate Other (See Comments)   Pt does not recall.        Medication List        Accurate as of February 09, 2024  8:25 AM. If you have any questions, ask your nurse or doctor.          ACCRUFeR  30 MG Caps Generic drug: Ferric Maltol  Take 1 capsule (30 mg total) by mouth every 12 (twelve) hours.   aspirin  EC 325 MG tablet Take 325 mg by mouth daily.   cetirizine 10  MG chewable tablet Commonly known as: ZYRTEC Chew 10 mg by mouth daily.   diphenhydrAMINE  25 mg capsule Commonly known as: BENADRYL  Take 50-75 mg by mouth every 6 (six) hours as needed (before tx).   docusate sodium  100 MG capsule Commonly known as: COLACE Take 100 mg by mouth daily as needed for mild constipation.   ezetimibe 10 MG tablet Commonly known as: ZETIA Take 10 mg by mouth daily.   glipiZIDE 2.5 MG 24 hr tablet Commonly known as: GLUCOTROL XL Take 2.5 mg by mouth daily.   hydrOXYzine  10 MG tablet Commonly known as: ATARAX  Take 1 tablet (10 mg total) by mouth every 8 (eight) hours as needed.   Jardiance 25 MG Tabs tablet Generic drug: empagliflozin Take 25 mg by mouth daily.   lenalidomide  15 MG capsule Commonly known as: REVLIMID  Take one tablet daily for two weeks (14days) and then off for two weeks (14 days)-[employer] Auth # [id number] Obtained 01/24/2024   Mounjaro 7.5 MG/0.5ML Pen Generic drug: tirzepatide Inject 7.5 mg into the skin once a week.   omeprazole  20 MG capsule Commonly known as: PRILOSEC Take 20 mg by mouth 2 (two)  times daily before a meal.   ondansetron  8 MG tablet Commonly known as: Zofran  Take 1 tablet (8 mg total) by mouth every 8 (eight) hours as needed for nausea or vomiting.   rosuvastatin  40 MG tablet Commonly known as: CRESTOR  Take 40 mg by mouth daily.   Testosterone  25 MG/2.5GM (1%) Gel Place 1 mg onto the skin every other day.   triamterene-hydrochlorothiazide 37.5-25 MG tablet Commonly known as: MAXZIDE-25 Take 1 tablet by mouth every morning.   valsartan 320 MG tablet Commonly known as: DIOVAN Take 320 mg by mouth daily.        Allergies:  Allergies  Allergen Reactions   Clindamycin Hcl Itching   Famvir  [Famciclovir ] Itching   Metoprolol  Succinate Other (See Comments)    Pt does not recall.    Past Medical History, Surgical history, Social history, and Family History were reviewed and updated.  Review of  Systems: Review of Systems  Constitutional: Negative.   HENT: Negative.    Eyes: Negative.   Respiratory: Negative.    Cardiovascular: Negative.   Gastrointestinal: Negative.   Genitourinary: Negative.   Musculoskeletal: Negative.   Skin: Negative.   Neurological: Negative.   Endo/Heme/Allergies: Negative.   Psychiatric/Behavioral: Negative.        Physical Exam:  height is 5' 9 (1.753 m) and weight is 173 lb (78.5 kg). His oral temperature is 98.2 F (36.8 C). His blood pressure is 114/55 (abnormal) and his pulse is 69. His respiration is 17 and oxygen saturation is 100%.    Wt Readings from Last 3 Encounters:  02/09/24 173 lb (78.5 kg)  01/12/24 174 lb 6.4 oz (79.1 kg)  12/15/23 177 lb (80.3 kg)    Physical Exam Vitals reviewed.  HENT:     Head: Normocephalic and atraumatic.  Eyes:     Pupils: Pupils are equal, round, and reactive to light.  Cardiovascular:     Rate and Rhythm: Normal rate and regular rhythm.     Heart sounds: Normal heart sounds.  Pulmonary:     Effort: Pulmonary effort is normal.     Breath sounds: Normal breath sounds.  Abdominal:     General: Bowel sounds are normal.     Palpations: Abdomen is soft.  Musculoskeletal:        General: No tenderness or deformity. Normal range of motion.     Cervical back: Normal range of motion.  Lymphadenopathy:     Cervical: No cervical adenopathy.  Skin:    General: Skin is warm and dry.     Findings: No erythema or rash.  Neurological:     Mental Status: He is alert and oriented to person, place, and time.  Psychiatric:        Behavior: Behavior normal.        Thought Content: Thought content normal.        Judgment: Judgment normal.      Lab Results  Component Value Date   WBC 4.8 02/09/2024   HGB 10.3 (L) 02/09/2024   HCT 34.8 (L) 02/09/2024   MCV 78.4 (L) 02/09/2024   PLT PENDING 02/09/2024   Lab Results  Component Value Date   FERRITIN 13 (L) 01/12/2024   IRON 69 01/12/2024   TIBC  545 (H) 01/12/2024   UIBC 476 (H) 01/12/2024   IRONPCTSAT 13 (L) 01/12/2024   Lab Results  Component Value Date   RETICCTPCT 1.1 01/12/2024   RBC 4.44 02/09/2024   Lab Results  Component Value Date   KPAFRELGTCHN 80.8 (  H) 01/12/2024   LAMBDASER 11.2 01/12/2024   KAPLAMBRATIO 7.21 (H) 01/12/2024   Lab Results  Component Value Date   IGGSERUM 1,587 01/12/2024   IGA 36 (L) 01/12/2024   IGMSERUM 23 01/12/2024   Lab Results  Component Value Date   TOTALPROTELP 6.7 01/12/2024   ALBUMINELP 3.5 01/12/2024   A1GS 0.2 01/12/2024   A2GS 0.8 01/12/2024   BETS 0.9 01/12/2024   GAMS 1.3 01/12/2024   MSPIKE 0.9 (H) 01/12/2024   SPEI Comment 05/20/2023     Chemistry      Component Value Date/Time   NA 138 01/12/2024 0801   NA 139 06/20/2020 0923   K 3.7 01/12/2024 0801   CL 101 01/12/2024 0801   CO2 27 01/12/2024 0801   BUN 21 01/12/2024 0801   BUN 17 06/20/2020 0923   CREATININE 1.13 01/12/2024 0801   CREATININE 1.12 09/03/2015 1201      Component Value Date/Time   CALCIUM  9.6 01/12/2024 0801   ALKPHOS 53 01/12/2024 0801   AST 14 (L) 01/12/2024 0801   ALT 13 01/12/2024 0801   BILITOT 0.4 01/12/2024 0801      Impression and Plan: Mr. Callow is a very pleasant 78 yo caucasian gentleman with IgG kappa multiple myeloma.  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  His FISH panel was negative. Currently he is on daratumumab /Velcade /Revlimid  without Decadron .   Is feeling better.  Again, the testosterone  is making him feel better.  For right now, we will continue him on the protocol.  I would not make any adjustments.  As long as his levels are low and stable, we do not think that we have to make any changes.  I want to make sure that we continue to focus on his quality of life.  Hopefully, the iron will help him feel a little better.  Will plan to get back in another month.   Maude JONELLE Crease, MD 8/13/20258:25 AM

## 2024-02-10 ENCOUNTER — Encounter: Payer: Self-pay | Admitting: *Deleted

## 2024-02-10 LAB — IGG, IGA, IGM
IgA: 31 mg/dL — ABNORMAL LOW (ref 61–437)
IgG (Immunoglobin G), Serum: 1360 mg/dL (ref 603–1613)
IgM (Immunoglobulin M), Srm: 20 mg/dL (ref 15–143)

## 2024-02-10 LAB — KAPPA/LAMBDA LIGHT CHAINS
Kappa free light chain: 70.2 mg/L — ABNORMAL HIGH (ref 3.3–19.4)
Kappa, lambda light chain ratio: 6.38 — ABNORMAL HIGH (ref 0.26–1.65)
Lambda free light chains: 11 mg/L (ref 5.7–26.3)

## 2024-02-14 LAB — PROTEIN ELECTROPHORESIS, SERUM, WITH REFLEX
A/G Ratio: 1.1 (ref 0.7–1.7)
Albumin ELP: 3.5 g/dL (ref 2.9–4.4)
Alpha-1-Globulin: 0.2 g/dL (ref 0.0–0.4)
Alpha-2-Globulin: 0.8 g/dL (ref 0.4–1.0)
Beta Globulin: 0.9 g/dL (ref 0.7–1.3)
Gamma Globulin: 1.3 g/dL (ref 0.4–1.8)
Globulin, Total: 3.1 g/dL (ref 2.2–3.9)
M-Spike, %: 0.7 g/dL — ABNORMAL HIGH
SPEP Interpretation: 0
Total Protein ELP: 6.6 g/dL (ref 6.0–8.5)

## 2024-02-14 LAB — IMMUNOFIXATION REFLEX, SERUM
IgA: 33 mg/dL — ABNORMAL LOW (ref 61–437)
IgG (Immunoglobin G), Serum: 1390 mg/dL (ref 603–1613)
IgM (Immunoglobulin M), Srm: 22 mg/dL (ref 15–143)

## 2024-02-18 ENCOUNTER — Other Ambulatory Visit: Payer: Self-pay | Admitting: Hematology & Oncology

## 2024-02-18 DIAGNOSIS — C9000 Multiple myeloma not having achieved remission: Secondary | ICD-10-CM

## 2024-03-08 ENCOUNTER — Inpatient Hospital Stay: Attending: Hematology & Oncology

## 2024-03-08 ENCOUNTER — Encounter: Payer: Self-pay | Admitting: Hematology & Oncology

## 2024-03-08 ENCOUNTER — Inpatient Hospital Stay (HOSPITAL_BASED_OUTPATIENT_CLINIC_OR_DEPARTMENT_OTHER): Admitting: Hematology & Oncology

## 2024-03-08 ENCOUNTER — Inpatient Hospital Stay

## 2024-03-08 VITALS — BP 127/66 | HR 65 | Temp 97.9°F | Resp 20 | Ht 69.0 in | Wt 172.8 lb

## 2024-03-08 VITALS — BP 154/65 | HR 64

## 2024-03-08 DIAGNOSIS — D509 Iron deficiency anemia, unspecified: Secondary | ICD-10-CM | POA: Diagnosis not present

## 2024-03-08 DIAGNOSIS — C9000 Multiple myeloma not having achieved remission: Secondary | ICD-10-CM | POA: Insufficient documentation

## 2024-03-08 DIAGNOSIS — Z79899 Other long term (current) drug therapy: Secondary | ICD-10-CM | POA: Diagnosis not present

## 2024-03-08 DIAGNOSIS — Z7961 Long term (current) use of immunomodulator: Secondary | ICD-10-CM | POA: Diagnosis not present

## 2024-03-08 DIAGNOSIS — Q929 Trisomy and partial trisomy of autosomes, unspecified: Secondary | ICD-10-CM | POA: Diagnosis not present

## 2024-03-08 DIAGNOSIS — Z5112 Encounter for antineoplastic immunotherapy: Secondary | ICD-10-CM | POA: Diagnosis present

## 2024-03-08 DIAGNOSIS — E237 Disorder of pituitary gland, unspecified: Secondary | ICD-10-CM

## 2024-03-08 DIAGNOSIS — D649 Anemia, unspecified: Secondary | ICD-10-CM

## 2024-03-08 LAB — CMP (CANCER CENTER ONLY)
ALT: 14 U/L (ref 0–44)
AST: 18 U/L (ref 15–41)
Albumin: 4.3 g/dL (ref 3.5–5.0)
Alkaline Phosphatase: 59 U/L (ref 38–126)
Anion gap: 12 (ref 5–15)
BUN: 17 mg/dL (ref 8–23)
CO2: 27 mmol/L (ref 22–32)
Calcium: 9.6 mg/dL (ref 8.9–10.3)
Chloride: 102 mmol/L (ref 98–111)
Creatinine: 1.21 mg/dL (ref 0.61–1.24)
GFR, Estimated: >60 mL/min (ref >60)
Glucose, Bld: 221 mg/dL — ABNORMAL HIGH (ref 70–99)
Potassium: 4.2 mmol/L (ref 3.5–5.1)
Sodium: 141 mmol/L (ref 135–145)
Total Bilirubin: 0.4 mg/dL (ref 0.0–1.2)
Total Protein: 7.2 g/dL (ref 6.5–8.1)

## 2024-03-08 LAB — CBC WITH DIFFERENTIAL (CANCER CENTER ONLY)
Abs Immature Granulocytes: 0.01 K/uL (ref 0.00–0.07)
Basophils Absolute: 0 K/uL (ref 0.0–0.1)
Basophils Relative: 1 %
Eosinophils Absolute: 0.2 K/uL (ref 0.0–0.5)
Eosinophils Relative: 4 %
HCT: 40.3 % (ref 39.0–52.0)
Hemoglobin: 12.4 g/dL — ABNORMAL LOW (ref 13.0–17.0)
Immature Granulocytes: 0 %
Lymphocytes Relative: 23 %
Lymphs Abs: 1 K/uL (ref 0.7–4.0)
MCH: 25.2 pg — ABNORMAL LOW (ref 26.0–34.0)
MCHC: 30.8 g/dL (ref 30.0–36.0)
MCV: 81.9 fL (ref 80.0–100.0)
Monocytes Absolute: 0.4 K/uL (ref 0.1–1.0)
Monocytes Relative: 9 %
Neutro Abs: 2.8 K/uL (ref 1.7–7.7)
Neutrophils Relative %: 63 %
Platelet Count: 125 K/uL — ABNORMAL LOW (ref 150–400)
RBC: 4.92 MIL/uL (ref 4.22–5.81)
RDW: 19.2 % — ABNORMAL HIGH (ref 11.5–15.5)
WBC Count: 4.4 K/uL (ref 4.0–10.5)
nRBC: 0 % (ref 0.0–0.2)

## 2024-03-08 LAB — FERRITIN: Ferritin: 36 ng/mL (ref 24–336)

## 2024-03-08 LAB — LACTATE DEHYDROGENASE: LDH: 135 U/L (ref 98–192)

## 2024-03-08 LAB — IRON AND IRON BINDING CAPACITY (CC-WL,HP ONLY)
Iron: 55 ug/dL (ref 45–182)
Saturation Ratios: 11 % — ABNORMAL LOW (ref 17.9–39.5)
TIBC: 500 ug/dL — ABNORMAL HIGH (ref 250–450)
UIBC: 445 ug/dL

## 2024-03-08 MED ORDER — DIPHENHYDRAMINE HCL 25 MG PO CAPS: 50.0000 mg | ORAL_CAPSULE | Freq: Once | ORAL | Status: DC

## 2024-03-08 MED ORDER — SODIUM CHLORIDE 0.9 % IV SOLN
510.0000 mg | Freq: Once | INTRAVENOUS | Status: AC
  Administered 2024-03-08: 510 mg via INTRAVENOUS
  Filled 2024-03-08: qty 17

## 2024-03-08 MED ORDER — BORTEZOMIB CHEMO SQ INJECTION 3.5 MG (2.5MG/ML)
1.3000 mg/m2 | Freq: Once | INTRAMUSCULAR | Status: AC
  Administered 2024-03-08: 2.5 mg via SUBCUTANEOUS
  Filled 2024-03-08: qty 1

## 2024-03-08 MED ORDER — ACETAMINOPHEN 325 MG PO TABS: 650.0000 mg | ORAL_TABLET | Freq: Once | ORAL | Status: DC

## 2024-03-08 MED ORDER — DARATUMUMAB-HYALURONIDASE-FIHJ 1800-30000 MG-UT/15ML ~~LOC~~ SOLN
1800.0000 mg | Freq: Once | SUBCUTANEOUS | Status: AC
  Administered 2024-03-08: 1800 mg via SUBCUTANEOUS
  Filled 2024-03-08: qty 15

## 2024-03-08 MED ORDER — SODIUM CHLORIDE 0.9 % IV SOLN: Freq: Once | INTRAVENOUS | Status: AC

## 2024-03-08 NOTE — Progress Notes (Signed)
 Hematology and Oncology Follow Up Visit  [name] [id number] 1945-07-23 78 y.o. 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  He is doing better.  He now is on testosterone  daily.  I think this probably was the reason why he was not feeling all that well and that his testosterone  level was quite low.  His iron level also was low.  Iron saturation was only 7%.  The ferritin was only 11.  He did receive some IV iron.  This really seemed to help.    Otherwise, seems to be doing pretty well.  He has had no problems with cough.  He has had no problems with nausea or vomiting.  There is no change in bowel or bladder habits.  He has had no bleeding.  There is been no leg swelling.  He does have diabetes.  He is off IV medications.  His last myeloma studies showed a monoclonal spike at 0.7 g/dL.  His IgG level was 1370 mg/dL.  The Kappa light chain was 7.1 mg/dL.  Overall, I would say that his performance status is ECOG 1.   Wt Readings from Last 3 Encounters:  02/09/24 173 lb (78.5 kg)  01/12/24 174 lb 6.4 oz (79.1 kg)  12/15/23 177 lb (80.3 kg)    Medications:  Allergies as of 03/08/2024       Reactions   Clindamycin Hcl Itching   Famvir  [famciclovir ] Itching   Metoprolol  Succinate Other (See Comments)   Pt does not recall.        Medication List        Accurate as of March 08, 2024  8:36 AM. If you have any questions, ask your nurse or doctor.          aspirin  EC 325 MG tablet Take 325 mg by mouth daily.   cetirizine 10 MG chewable tablet Commonly known as: ZYRTEC Chew 10 mg by mouth  daily.   diphenhydrAMINE  25 mg capsule Commonly known as: BENADRYL  Take 50-75 mg by mouth every 6 (six) hours as needed (before tx).   docusate sodium  100 MG capsule Commonly known as: COLACE Take 100 mg by mouth daily as needed for mild constipation.   ezetimibe 10 MG tablet Commonly known as: ZETIA Take 10 mg by mouth daily.   glipiZIDE 2.5 MG 24 hr tablet Commonly known as: GLUCOTROL XL Take 2.5 mg by mouth daily.   hydrOXYzine  10 MG tablet Commonly known as: ATARAX  Take 1 tablet (10 mg total) by mouth every 8 (eight) hours as needed.   Jardiance 25 MG Tabs tablet Generic drug: empagliflozin Take 25 mg by mouth daily.   lenalidomide  15 MG capsule Commonly known as: REVLIMID  Take 1 cap by mouth once daily for 14 days on, then 14 days off. Auth# [id number]   Mounjaro 7.5 MG/0.5ML Pen Generic drug: tirzepatide Inject 7.5 mg into the skin once a week.   omeprazole  20 MG capsule Commonly known as: PRILOSEC Take 20 mg by mouth 2 (two) times daily before a meal.   ondansetron  8 MG tablet Commonly known as: Zofran   Take 1 tablet (8 mg total) by mouth every 8 (eight) hours as needed for nausea or vomiting.   rosuvastatin  40 MG tablet Commonly known as: CRESTOR  Take 40 mg by mouth daily.   Testosterone  25 MG/2.5GM (1%) Gel Place 1 mg onto the skin every other day.   triamterene-hydrochlorothiazide 37.5-25 MG tablet Commonly known as: MAXZIDE-25 Take 1 tablet by mouth every morning.   valsartan 320 MG tablet Commonly known as: DIOVAN Take 320 mg by mouth daily.        Allergies:  Allergies  Allergen Reactions   Clindamycin Hcl Itching   Famvir  [Famciclovir ] Itching   Metoprolol  Succinate Other (See Comments)    Pt does not recall.    Past Medical History, Surgical history, Social history, and Family History were reviewed and updated.  Review of Systems: Review of Systems  Constitutional: Negative.   HENT: Negative.    Eyes: Negative.   Respiratory:  Negative.    Cardiovascular: Negative.   Gastrointestinal: Negative.   Genitourinary: Negative.   Musculoskeletal: Negative.   Skin: Negative.   Neurological: Negative.   Endo/Heme/Allergies: Negative.   Psychiatric/Behavioral: Negative.        Physical Exam:  Vital signs show a temperature of 97.9.  Pulse 65.  Blood pressure 127/66.  Weight is under 72 pounds.  Wt Readings from Last 3 Encounters:  02/09/24 173 lb (78.5 kg)  01/12/24 174 lb 6.4 oz (79.1 kg)  12/15/23 177 lb (80.3 kg)    Physical Exam Vitals reviewed.  HENT:     Head: Normocephalic and atraumatic.  Eyes:     Pupils: Pupils are equal, round, and reactive to light.  Cardiovascular:     Rate and Rhythm: Normal rate and regular rhythm.     Heart sounds: Normal heart sounds.  Pulmonary:     Effort: Pulmonary effort is normal.     Breath sounds: Normal breath sounds.  Abdominal:     General: Bowel sounds are normal.     Palpations: Abdomen is soft.  Musculoskeletal:        General: No tenderness or deformity. Normal range of motion.     Cervical back: Normal range of motion.  Lymphadenopathy:     Cervical: No cervical adenopathy.  Skin:    General: Skin is warm and dry.     Findings: No erythema or rash.  Neurological:     Mental Status: He is alert and oriented to person, place, and time.  Psychiatric:        Behavior: Behavior normal.        Thought Content: Thought content normal.        Judgment: Judgment normal.      Lab Results  Component Value Date   WBC 4.4 03/08/2024   HGB 12.4 (L) 03/08/2024   HCT 40.3 03/08/2024   MCV 81.9 03/08/2024   PLT 125 (L) 03/08/2024   Lab Results  Component Value Date   FERRITIN 11 (L) 02/09/2024   IRON 39 (L) 02/09/2024   TIBC 564 (H) 02/09/2024   UIBC 525 02/09/2024   IRONPCTSAT 7 (L) 02/09/2024   Lab Results  Component Value Date   RETICCTPCT 1.1 01/12/2024   RBC 4.92 03/08/2024   Lab Results  Component Value Date   KPAFRELGTCHN 70.2 (H)  02/09/2024   LAMBDASER 11.0 02/09/2024   KAPLAMBRATIO 6.38 (H) 02/09/2024   Lab Results  Component Value Date   IGGSERUM 1,360 02/09/2024   IGGSERUM 1,390 02/09/2024   IGA 31 (L) 02/09/2024  IGA 33 (L) 02/09/2024   IGMSERUM 20 02/09/2024   IGMSERUM 22 02/09/2024   Lab Results  Component Value Date   TOTALPROTELP 6.6 02/09/2024   ALBUMINELP 3.5 02/09/2024   A1GS 0.2 02/09/2024   A2GS 0.8 02/09/2024   BETS 0.9 02/09/2024   GAMS 1.3 02/09/2024   MSPIKE 0.7 (H) 02/09/2024   SPEI Comment 05/20/2023     Chemistry      Component Value Date/Time   NA 137 02/09/2024 0758   NA 139 06/20/2020 0923   K 3.7 02/09/2024 0758   CL 101 02/09/2024 0758   CO2 24 02/09/2024 0758   BUN 16 02/09/2024 0758   BUN 17 06/20/2020 0923   CREATININE 1.04 02/09/2024 0758   CREATININE 1.12 09/03/2015 1201      Component Value Date/Time   CALCIUM  9.2 02/09/2024 0758   ALKPHOS 54 02/09/2024 0758   AST 20 02/09/2024 0758   ALT 17 02/09/2024 0758   BILITOT 0.4 02/09/2024 0758      Impression and Plan: [name] is a very pleasant 78 yo caucasian gentleman with IgG kappa multiple myeloma.  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  His FISH panel was negative. Currently he is on daratumumab /Velcade /Revlimid  without Decadron .   Thankfully, the change in his Revlimid  has helped.  The 2-week on/2-week off protocol is good for him.  I think iron is also helping him quite a bit.  We will go ahead with his treatment.  We will have him come back in another 4 weeks.    Maude JONELLE Crease, MD 9/10/20258:36 AM

## 2024-03-08 NOTE — Patient Instructions (Signed)
 [facility] - A DEPT OF . [facility]  Discharge Instructions: Thank you for choosing [facility] to provide your oncology and hematology care.   If you have a lab appointment with the Cancer Center, please go directly to the Cancer Center and check in at the registration area.  Wear comfortable clothing and clothing appropriate for easy access to any Portacath or PICC line.   We strive to give you quality time with your provider. You may need to reschedule your appointment if you arrive late (15 or more minutes).  Arriving late affects you and other patients whose appointments are after yours.  Also, if you miss three or more appointments without notifying the office, you may be dismissed from the clinic at the provider's discretion.      For prescription refill requests, have your pharmacy contact our office and allow 72 hours for refills to be completed.    Today you received the following chemotherapy and/or immunotherapy agents Darzalex  and Velcade       To help prevent nausea and vomiting after your treatment, we encourage you to take your nausea medication as directed.  BELOW ARE SYMPTOMS THAT SHOULD BE REPORTED IMMEDIATELY: *FEVER GREATER THAN 100.4 F (38 C) OR HIGHER *CHILLS OR SWEATING *NAUSEA AND VOMITING THAT IS NOT CONTROLLED WITH YOUR NAUSEA MEDICATION *UNUSUAL SHORTNESS OF BREATH *UNUSUAL BRUISING OR BLEEDING *URINARY PROBLEMS (pain or burning when urinating, or frequent urination) *BOWEL PROBLEMS (unusual diarrhea, constipation, pain near the anus) TENDERNESS IN MOUTH AND THROAT WITH OR WITHOUT PRESENCE OF ULCERS (sore throat, sores in mouth, or a toothache) UNUSUAL RASH, SWELLING OR PAIN  UNUSUAL VAGINAL DISCHARGE OR ITCHING   Items with * indicate a potential emergency and should be followed up as soon as possible or go to the Emergency Department if any problems should occur.  Please show the CHEMOTHERAPY ALERT CARD or  IMMUNOTHERAPY ALERT CARD at check-in to the Emergency Department and triage nurse. Should you have questions after your visit or need to cancel or reschedule your appointment, please contact [facility] - A DEPT OF [facility]  [phone] and follow the prompts.  Office hours are 8:00 a.m. to 4:30 p.m. Monday - Friday. Please note that voicemails left after 4:00 p.m. may not be returned until the following business day.  We are closed weekends and major holidays. You have access to a nurse at all times for urgent questions. Please call the main number to the clinic [phone] and follow the prompts.  For any non-urgent questions, you may also contact your provider using MyChart. We now offer e-Visits for anyone 13 and older to request care online for non-urgent symptoms. For details visit [email or web address].   Also download the MyChart app! Go to the app store, search MyChart, open the app, select [facility], and log in with your MyChart username and password.

## 2024-03-09 ENCOUNTER — Ambulatory Visit: Payer: Self-pay | Admitting: Hematology & Oncology

## 2024-03-09 LAB — IGG, IGA, IGM
IgA: 30 mg/dL — ABNORMAL LOW (ref 61–437)
IgG (Immunoglobin G), Serum: 1309 mg/dL (ref 603–1613)
IgM (Immunoglobulin M), Srm: 20 mg/dL (ref 15–143)

## 2024-03-09 LAB — KAPPA/LAMBDA LIGHT CHAINS
Kappa free light chain: 67.9 mg/L — ABNORMAL HIGH (ref 3.3–19.4)
Kappa, lambda light chain ratio: 7.46 — ABNORMAL HIGH (ref 0.26–1.65)
Lambda free light chains: 9.1 mg/L (ref 5.7–26.3)

## 2024-03-16 LAB — PROTEIN ELECTROPHORESIS, SERUM, WITH REFLEX
A/G Ratio: 1.3 (ref 0.7–1.7)
Albumin ELP: 3.9 g/dL (ref 2.9–4.4)
Alpha-1-Globulin: 0.2 g/dL (ref 0.0–0.4)
Alpha-2-Globulin: 0.7 g/dL (ref 0.4–1.0)
Beta Globulin: 0.9 g/dL (ref 0.7–1.3)
Gamma Globulin: 1.2 g/dL (ref 0.4–1.8)
Globulin, Total: 3 g/dL (ref 2.2–3.9)
M-Spike, %: 0.9 g/dL — ABNORMAL HIGH
SPEP Interpretation: 0
Total Protein ELP: 6.9 g/dL (ref 6.0–8.5)

## 2024-03-16 LAB — IMMUNOFIXATION REFLEX, SERUM
IgA: 41 mg/dL — ABNORMAL LOW (ref 61–437)
IgG (Immunoglobin G), Serum: 1556 mg/dL (ref 603–1613)
IgM (Immunoglobulin M), Srm: 24 mg/dL (ref 15–143)

## 2024-03-24 ENCOUNTER — Other Ambulatory Visit: Payer: Self-pay | Admitting: *Deleted

## 2024-03-24 DIAGNOSIS — C9000 Multiple myeloma not having achieved remission: Secondary | ICD-10-CM

## 2024-03-24 MED ORDER — LENALIDOMIDE 15 MG PO CAPS: ORAL_CAPSULE | ORAL | 0 refills | Status: DC

## 2024-04-05 ENCOUNTER — Inpatient Hospital Stay: Attending: Hematology & Oncology

## 2024-04-05 ENCOUNTER — Encounter: Payer: Self-pay | Admitting: Hematology & Oncology

## 2024-04-05 ENCOUNTER — Ambulatory Visit: Payer: Self-pay | Admitting: Hematology & Oncology

## 2024-04-05 ENCOUNTER — Inpatient Hospital Stay (HOSPITAL_BASED_OUTPATIENT_CLINIC_OR_DEPARTMENT_OTHER): Admitting: Hematology & Oncology

## 2024-04-05 ENCOUNTER — Other Ambulatory Visit: Payer: Self-pay | Admitting: Oncology

## 2024-04-05 ENCOUNTER — Inpatient Hospital Stay

## 2024-04-05 VITALS — BP 146/73 | HR 63 | Temp 97.6°F | Resp 20

## 2024-04-05 DIAGNOSIS — D509 Iron deficiency anemia, unspecified: Secondary | ICD-10-CM | POA: Insufficient documentation

## 2024-04-05 DIAGNOSIS — Z7961 Long term (current) use of immunomodulator: Secondary | ICD-10-CM | POA: Insufficient documentation

## 2024-04-05 DIAGNOSIS — C9000 Multiple myeloma not having achieved remission: Secondary | ICD-10-CM | POA: Insufficient documentation

## 2024-04-05 DIAGNOSIS — E291 Testicular hypofunction: Secondary | ICD-10-CM

## 2024-04-05 DIAGNOSIS — E237 Disorder of pituitary gland, unspecified: Secondary | ICD-10-CM

## 2024-04-05 DIAGNOSIS — D72819 Decreased white blood cell count, unspecified: Secondary | ICD-10-CM | POA: Diagnosis not present

## 2024-04-05 DIAGNOSIS — Z5112 Encounter for antineoplastic immunotherapy: Secondary | ICD-10-CM | POA: Insufficient documentation

## 2024-04-05 DIAGNOSIS — Z79890 Hormone replacement therapy (postmenopausal): Secondary | ICD-10-CM | POA: Diagnosis not present

## 2024-04-05 DIAGNOSIS — Z79899 Other long term (current) drug therapy: Secondary | ICD-10-CM | POA: Diagnosis not present

## 2024-04-05 LAB — CMP (CANCER CENTER ONLY)
ALT: 16 U/L (ref 0–44)
AST: 18 U/L (ref 15–41)
Albumin: 4.3 g/dL (ref 3.5–5.0)
Alkaline Phosphatase: 57 U/L (ref 38–126)
Anion gap: 13 (ref 5–15)
BUN: 16 mg/dL (ref 8–23)
CO2: 27 mmol/L (ref 22–32)
Calcium: 9.4 mg/dL (ref 8.9–10.3)
Chloride: 100 mmol/L (ref 98–111)
Creatinine: 1.2 mg/dL (ref 0.61–1.24)
GFR, Estimated: >60 mL/min (ref >60)
Glucose, Bld: 196 mg/dL — ABNORMAL HIGH (ref 70–99)
Potassium: 3.8 mmol/L (ref 3.5–5.1)
Sodium: 140 mmol/L (ref 135–145)
Total Bilirubin: 0.4 mg/dL (ref 0.0–1.2)
Total Protein: 7 g/dL (ref 6.5–8.1)

## 2024-04-05 LAB — CBC WITH DIFFERENTIAL (CANCER CENTER ONLY)
Abs Immature Granulocytes: 0.01 K/uL (ref 0.00–0.07)
Basophils Absolute: 0 K/uL (ref 0.0–0.1)
Basophils Relative: 1 %
Eosinophils Absolute: 0.2 K/uL (ref 0.0–0.5)
Eosinophils Relative: 3 %
HCT: 44.3 % (ref 39.0–52.0)
Hemoglobin: 14.2 g/dL (ref 13.0–17.0)
Immature Granulocytes: 0 %
Lymphocytes Relative: 24 %
Lymphs Abs: 1.2 K/uL (ref 0.7–4.0)
MCH: 26.4 pg (ref 26.0–34.0)
MCHC: 32.1 g/dL (ref 30.0–36.0)
MCV: 82.5 fL (ref 80.0–100.0)
Monocytes Absolute: 0.3 K/uL (ref 0.1–1.0)
Monocytes Relative: 7 %
Neutro Abs: 3.2 K/uL (ref 1.7–7.7)
Neutrophils Relative %: 65 %
Platelet Count: 140 K/uL — ABNORMAL LOW (ref 150–400)
RBC: 5.37 MIL/uL (ref 4.22–5.81)
RDW: 18.8 % — ABNORMAL HIGH (ref 11.5–15.5)
WBC Count: 5 K/uL (ref 4.0–10.5)
nRBC: 0 % (ref 0.0–0.2)

## 2024-04-05 LAB — IRON AND IRON BINDING CAPACITY (CC-WL,HP ONLY)
Iron: 67 ug/dL (ref 45–182)
Saturation Ratios: 16 % — ABNORMAL LOW (ref 17.9–39.5)
TIBC: 427 ug/dL (ref 250–450)
UIBC: 360 ug/dL

## 2024-04-05 LAB — LACTATE DEHYDROGENASE: LDH: 145 U/L (ref 98–192)

## 2024-04-05 LAB — FERRITIN: Ferritin: 63 ng/mL (ref 24–336)

## 2024-04-05 MED ORDER — HYDROXYZINE HCL 10 MG PO TABS: 10.0000 mg | ORAL_TABLET | Freq: Three times a day (TID) | ORAL | 2 refills | Status: AC | PRN

## 2024-04-05 MED ORDER — BORTEZOMIB CHEMO SQ INJECTION 3.5 MG (2.5MG/ML)
1.3000 mg/m2 | Freq: Once | INTRAMUSCULAR | Status: AC
  Administered 2024-04-05: 2.5 mg via SUBCUTANEOUS
  Filled 2024-04-05: qty 1

## 2024-04-05 MED ORDER — ACETAMINOPHEN 325 MG PO TABS: 650.0000 mg | ORAL_TABLET | Freq: Once | ORAL | Status: DC

## 2024-04-05 MED ORDER — DARATUMUMAB-HYALURONIDASE-FIHJ 1800-30000 MG-UT/15ML ~~LOC~~ SOLN
1800.0000 mg | Freq: Once | SUBCUTANEOUS | Status: AC
  Administered 2024-04-05: 1800 mg via SUBCUTANEOUS
  Filled 2024-04-05: qty 15

## 2024-04-05 MED ORDER — DIPHENHYDRAMINE HCL 25 MG PO CAPS: 50.0000 mg | ORAL_CAPSULE | Freq: Once | ORAL | Status: DC

## 2024-04-05 NOTE — Progress Notes (Signed)
 Hematology and Oncology Follow Up Visit  Davian Hanshaw [id number] Jan 06, 1946 78 y.o. 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 Bulkley is here today for follow-up.  He is doing better.  He now is on testosterone  daily.  I think this probably was the reason why he was not feeling all that well and that his testosterone  level was quite low.  His iron level also was low.  Iron saturation was only 7%.  The ferritin was only 11.  He did receive some IV iron.  This really seemed to help.    Otherwise, seems to be doing pretty well.  He has had no problems with cough.  He has had no problems with nausea or vomiting.  There is no change in bowel or bladder habits.  He has had no bleeding.  There is been no leg swelling.  He does have diabetes.  He is off IV medications.  His last myeloma studies showed a monoclonal spike at 0.9 g/dL.  His IgG level was 1400 mg/dL.  The Kappa light chain was 6.8 mg/dL.  Overall, I would say that his performance status is ECOG 1.   Wt Readings from Last 3 Encounters:  04/05/24 (P) 171 lb 6.4 oz (77.7 kg)  03/08/24 172 lb 12.8 oz (78.4 kg)  02/09/24 173 lb (78.5 kg)    Medications:  Allergies as of 04/05/2024       Reactions   Clindamycin Hcl Itching   Famvir  [famciclovir ] Itching   Metoprolol  Succinate Other (See Comments)   Pt does not recall.        Medication List        Accurate as of April 05, 2024 10:35 AM. If you have any questions, ask your nurse or doctor.          aspirin  EC 325 MG tablet Take 325 mg by mouth daily.   cetirizine 10 MG chewable tablet Commonly known as: ZYRTEC Chew 10 mg  by mouth daily.   diphenhydrAMINE  25 mg capsule Commonly known as: BENADRYL  Take 50-75 mg by mouth every 6 (six) hours as needed (before tx).   docusate sodium  100 MG capsule Commonly known as: COLACE Take 100 mg by mouth daily as needed for mild constipation.   ezetimibe 10 MG tablet Commonly known as: ZETIA Take 10 mg by mouth daily.   hydrOXYzine  10 MG tablet Commonly known as: ATARAX  Take 1 tablet (10 mg total) by mouth every 8 (eight) hours as needed.   Jardiance 25 MG Tabs tablet Generic drug: empagliflozin Take 25 mg by mouth daily.   lenalidomide  15 MG capsule Commonly known as: REVLIMID  Take 1 cap by mouth once daily for 14 days on, then 14 days off. Auth# [id number]   Mounjaro 10 MG/0.5ML Pen Generic drug: tirzepatide Inject 10 mg into the skin once a week.   omeprazole  20 MG capsule Commonly known as: PRILOSEC Take 20 mg by mouth 2 (two) times daily before a meal.   ondansetron  8 MG tablet Commonly known as: Zofran  Take 1 tablet (8 mg total) by mouth every 8 (eight) hours as needed for nausea or  vomiting.   rosuvastatin  40 MG tablet Commonly known as: CRESTOR  Take 40 mg by mouth daily.   Testosterone  25 MG/2.5GM (1%) Gel Place 1 mg onto the skin every other day.   triamterene-hydrochlorothiazide 37.5-25 MG tablet Commonly known as: MAXZIDE-25 Take 1 tablet by mouth every morning.   valsartan 320 MG tablet Commonly known as: DIOVAN Take 320 mg by mouth daily.        Allergies:  Allergies  Allergen Reactions   Clindamycin Hcl Itching   Famvir  [Famciclovir ] Itching   Metoprolol  Succinate Other (See Comments)    Pt does not recall.    Past Medical History, Surgical history, Social history, and Family History were reviewed and updated.  Review of Systems: Review of Systems  Constitutional: Negative.   HENT: Negative.    Eyes: Negative.   Respiratory: Negative.    Cardiovascular: Negative.   Gastrointestinal: Negative.   Genitourinary:  Negative.   Musculoskeletal: Negative.   Skin: Negative.   Neurological: Negative.   Endo/Heme/Allergies: Negative.   Psychiatric/Behavioral: Negative.        Physical Exam:  Vital signs show a temperature of 97.6.  Pulse 63.  Blood pressure 146/73.  Weight is 171 pounds.   Wt Readings from Last 3 Encounters:  04/05/24 (P) 171 lb 6.4 oz (77.7 kg)  03/08/24 172 lb 12.8 oz (78.4 kg)  02/09/24 173 lb (78.5 kg)    Physical Exam Vitals reviewed.  HENT:     Head: Normocephalic and atraumatic.  Eyes:     Pupils: Pupils are equal, round, and reactive to light.  Cardiovascular:     Rate and Rhythm: Normal rate and regular rhythm.     Heart sounds: Normal heart sounds.  Pulmonary:     Effort: Pulmonary effort is normal.     Breath sounds: Normal breath sounds.  Abdominal:     General: Bowel sounds are normal.     Palpations: Abdomen is soft.  Musculoskeletal:        General: No tenderness or deformity. Normal range of motion.     Cervical back: Normal range of motion.  Lymphadenopathy:     Cervical: No cervical adenopathy.  Skin:    General: Skin is warm and dry.     Findings: No erythema or rash.  Neurological:     Mental Status: He is alert and oriented to person, place, and time.  Psychiatric:        Behavior: Behavior normal.        Thought Content: Thought content normal.        Judgment: Judgment normal.      Lab Results  Component Value Date   WBC 5.0 04/05/2024   HGB 14.2 04/05/2024   HCT 44.3 04/05/2024   MCV 82.5 04/05/2024   PLT 140 (L) 04/05/2024   Lab Results  Component Value Date   FERRITIN 36 03/08/2024   IRON 55 03/08/2024   TIBC 500 (H) 03/08/2024   UIBC 445 03/08/2024   IRONPCTSAT 11 (L) 03/08/2024   Lab Results  Component Value Date   RETICCTPCT 1.1 01/12/2024   RBC 5.37 04/05/2024   Lab Results  Component Value Date   KPAFRELGTCHN 67.9 (H) 03/08/2024   LAMBDASER 9.1 03/08/2024   KAPLAMBRATIO 7.46 (H) 03/08/2024   Lab Results   Component Value Date   IGGSERUM 1,309 03/08/2024   IGGSERUM 1,556 03/08/2024   IGA 30 (L) 03/08/2024   IGA 41 (L) 03/08/2024   IGMSERUM 20 03/08/2024   IGMSERUM 24 03/08/2024   Lab  Results  Component Value Date   TOTALPROTELP 6.9 03/08/2024   ALBUMINELP 3.9 03/08/2024   A1GS 0.2 03/08/2024   A2GS 0.7 03/08/2024   BETS 0.9 03/08/2024   GAMS 1.2 03/08/2024   MSPIKE 0.9 (H) 03/08/2024   SPEI Comment 05/20/2023     Chemistry      Component Value Date/Time   NA 140 04/05/2024 0926   NA 139 06/20/2020 0923   K 3.8 04/05/2024 0926   CL 100 04/05/2024 0926   CO2 27 04/05/2024 0926   BUN 16 04/05/2024 0926   BUN 17 06/20/2020 0923   CREATININE 1.20 04/05/2024 0926   CREATININE 1.12 09/03/2015 1201      Component Value Date/Time   CALCIUM  9.4 04/05/2024 0926   ALKPHOS 57 04/05/2024 0926   AST 18 04/05/2024 0926   ALT 16 04/05/2024 0926   BILITOT 0.4 04/05/2024 0926      Impression and Plan: Mr. Hardenbrook is a very pleasant 78 yo caucasian gentleman with IgG kappa multiple myeloma.  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  His FISH panel was negative. Currently he is on daratumumab /Velcade /Revlimid  without Decadron .   Thankfully, the change in his Revlimid  has helped.  The 2-week on/2-week off protocol is good for him.  We will see what his iron levels look like.  I would have to believe that they are going to be okay since his hemoglobin keeps coming up.  We will continue to follow the myeloma levels.  Hopefully, we will see that they continue to stabilize.  It would be nice to try to give him a vacation from treatment, maybe through the Holiday season.  We will have him come back in another 4 weeks.    Maude JONELLE Crease, MD 10/8/202510:35 AM

## 2024-04-05 NOTE — Progress Notes (Signed)
 BP remains elevated, 146/73, has not taken BP meds today.  Instructed to monitor at home and if it remains over 140/90, contact PCP. Verbalized understanding.

## 2024-04-06 LAB — KAPPA/LAMBDA LIGHT CHAINS
Kappa free light chain: 70.2 mg/L — ABNORMAL HIGH (ref 3.3–19.4)
Kappa, lambda light chain ratio: 8.67 — ABNORMAL HIGH (ref 0.26–1.65)
Lambda free light chains: 8.1 mg/L (ref 5.7–26.3)

## 2024-04-06 LAB — IGG, IGA, IGM
IgA: 30 mg/dL — ABNORMAL LOW (ref 61–437)
IgG (Immunoglobin G), Serum: 1239 mg/dL (ref 603–1613)
IgM (Immunoglobulin M), Srm: 20 mg/dL (ref 15–143)

## 2024-04-06 LAB — TESTOSTERONE: Testosterone: 318 ng/dL (ref 264–916)

## 2024-04-10 ENCOUNTER — Telehealth: Payer: Self-pay | Admitting: Hematology & Oncology

## 2024-04-10 LAB — PROTEIN ELECTROPHORESIS, SERUM, WITH REFLEX
A/G Ratio: 1.2 (ref 0.7–1.7)
Albumin ELP: 3.6 g/dL (ref 2.9–4.4)
Alpha-1-Globulin: 0.2 g/dL (ref 0.0–0.4)
Alpha-2-Globulin: 0.8 g/dL (ref 0.4–1.0)
Beta Globulin: 0.8 g/dL (ref 0.7–1.3)
Gamma Globulin: 1.2 g/dL (ref 0.4–1.8)
Globulin, Total: 3 g/dL (ref 2.2–3.9)
M-Spike, %: 0.9 g/dL — ABNORMAL HIGH
SPEP Interpretation: 0
Total Protein ELP: 6.6 g/dL (ref 6.0–8.5)

## 2024-04-10 LAB — IMMUNOFIXATION REFLEX, SERUM
IgA: 32 mg/dL — ABNORMAL LOW (ref 61–437)
IgG (Immunoglobin G), Serum: 1364 mg/dL (ref 603–1613)
IgM (Immunoglobulin M), Srm: 20 mg/dL (ref 15–143)

## 2024-04-10 NOTE — Telephone Encounter (Signed)
 Called to schedule infusion per inbasket. LVM to return call for scheduling.

## 2024-04-19 ENCOUNTER — Inpatient Hospital Stay

## 2024-04-19 ENCOUNTER — Inpatient Hospital Stay: Admitting: Licensed Clinical Social Worker

## 2024-04-19 VITALS — BP 143/65 | HR 54 | Temp 97.8°F | Resp 18

## 2024-04-19 DIAGNOSIS — D649 Anemia, unspecified: Secondary | ICD-10-CM

## 2024-04-19 DIAGNOSIS — Z5112 Encounter for antineoplastic immunotherapy: Secondary | ICD-10-CM | POA: Diagnosis not present

## 2024-04-19 MED ORDER — SODIUM CHLORIDE 0.9 % IV SOLN: Freq: Once | INTRAVENOUS | Status: AC

## 2024-04-19 MED ORDER — SODIUM CHLORIDE 0.9 % IV SOLN
510.0000 mg | Freq: Once | INTRAVENOUS | Status: AC
  Administered 2024-04-19: 510 mg via INTRAVENOUS
  Filled 2024-04-19: qty 17

## 2024-04-19 NOTE — Patient Instructions (Signed)

## 2024-04-19 NOTE — Progress Notes (Signed)
Patient refused to wait 30 minutes post infusion. Released stable and ASX. 

## 2024-04-19 NOTE — Progress Notes (Signed)
 [facility] CSW Progress Note  Visual merchandiser met with patient to follow-up on emotional support.    Interventions: Provided brief mental health counseling with regard to adjusting to his illness.  Patient continues having a positive attitude.  He plays golf and engages with others.  Patient is looking forward to traveling again.       Follow Up Plan:  Patient will contact CSW with any support or resource needs    [name] CHRISTELLA Au, LCSW Clinical Social Worker [facility]

## 2024-04-21 ENCOUNTER — Other Ambulatory Visit: Payer: Self-pay | Admitting: Hematology & Oncology

## 2024-04-21 DIAGNOSIS — C9000 Multiple myeloma not having achieved remission: Secondary | ICD-10-CM

## 2024-05-03 ENCOUNTER — Inpatient Hospital Stay

## 2024-05-03 ENCOUNTER — Inpatient Hospital Stay (HOSPITAL_BASED_OUTPATIENT_CLINIC_OR_DEPARTMENT_OTHER): Admitting: Hematology & Oncology

## 2024-05-03 ENCOUNTER — Inpatient Hospital Stay: Attending: Hematology & Oncology

## 2024-05-03 VITALS — BP 114/62 | HR 78 | Temp 97.9°F | Resp 16 | Wt 165.4 lb

## 2024-05-03 DIAGNOSIS — C9000 Multiple myeloma not having achieved remission: Secondary | ICD-10-CM | POA: Insufficient documentation

## 2024-05-03 DIAGNOSIS — D509 Iron deficiency anemia, unspecified: Secondary | ICD-10-CM | POA: Insufficient documentation

## 2024-05-03 DIAGNOSIS — D72819 Decreased white blood cell count, unspecified: Secondary | ICD-10-CM

## 2024-05-03 DIAGNOSIS — Z79899 Other long term (current) drug therapy: Secondary | ICD-10-CM | POA: Insufficient documentation

## 2024-05-03 DIAGNOSIS — Z5112 Encounter for antineoplastic immunotherapy: Secondary | ICD-10-CM | POA: Insufficient documentation

## 2024-05-03 DIAGNOSIS — E291 Testicular hypofunction: Secondary | ICD-10-CM

## 2024-05-03 LAB — CMP (CANCER CENTER ONLY)
ALT: 17 U/L (ref 0–44)
AST: 18 U/L (ref 15–41)
Albumin: 4.2 g/dL (ref 3.5–5.0)
Alkaline Phosphatase: 58 U/L (ref 38–126)
Anion gap: 13 (ref 5–15)
BUN: 22 mg/dL (ref 8–23)
CO2: 24 mmol/L (ref 22–32)
Calcium: 9.4 mg/dL (ref 8.9–10.3)
Chloride: 100 mmol/L (ref 98–111)
Creatinine: 1.22 mg/dL (ref 0.61–1.24)
GFR, Estimated: 60 mL/min — ABNORMAL LOW (ref >60)
Glucose, Bld: 284 mg/dL — ABNORMAL HIGH (ref 70–99)
Potassium: 3.5 mmol/L (ref 3.5–5.1)
Sodium: 137 mmol/L (ref 135–145)
Total Bilirubin: 0.4 mg/dL (ref 0.0–1.2)
Total Protein: 6.6 g/dL (ref 6.5–8.1)

## 2024-05-03 LAB — CBC WITH DIFFERENTIAL (CANCER CENTER ONLY)
Abs Immature Granulocytes: 0.01 K/uL (ref 0.00–0.07)
Basophils Absolute: 0 K/uL (ref 0.0–0.1)
Basophils Relative: 1 %
Eosinophils Absolute: 0.2 K/uL (ref 0.0–0.5)
Eosinophils Relative: 4 %
HCT: 47 % (ref 39.0–52.0)
Hemoglobin: 15.7 g/dL (ref 13.0–17.0)
Immature Granulocytes: 0 %
Lymphocytes Relative: 24 %
Lymphs Abs: 1.2 K/uL (ref 0.7–4.0)
MCH: 27.8 pg (ref 26.0–34.0)
MCHC: 33.4 g/dL (ref 30.0–36.0)
MCV: 83.3 fL (ref 80.0–100.0)
Monocytes Absolute: 0.3 K/uL (ref 0.1–1.0)
Monocytes Relative: 6 %
Neutro Abs: 3.4 K/uL (ref 1.7–7.7)
Neutrophils Relative %: 65 %
Platelet Count: 129 K/uL — ABNORMAL LOW (ref 150–400)
RBC: 5.64 MIL/uL (ref 4.22–5.81)
RDW: 17.3 % — ABNORMAL HIGH (ref 11.5–15.5)
WBC Count: 5.1 K/uL (ref 4.0–10.5)
nRBC: 0 % (ref 0.0–0.2)

## 2024-05-03 LAB — IRON AND IRON BINDING CAPACITY (CC-WL,HP ONLY)
Iron: 87 ug/dL (ref 45–182)
Saturation Ratios: 22 % (ref 17.9–39.5)
TIBC: 403 ug/dL (ref 250–450)
UIBC: 316 ug/dL

## 2024-05-03 LAB — FERRITIN: Ferritin: 224 ng/mL (ref 24–336)

## 2024-05-03 LAB — LACTATE DEHYDROGENASE: LDH: 194 U/L — ABNORMAL HIGH (ref 98–192)

## 2024-05-03 MED ORDER — ACETAMINOPHEN 325 MG PO TABS: 650.0000 mg | ORAL_TABLET | Freq: Once | ORAL | Status: DC

## 2024-05-03 MED ORDER — DARATUMUMAB-HYALURONIDASE-FIHJ 1800-30000 MG-UT/15ML ~~LOC~~ SOLN
1800.0000 mg | Freq: Once | SUBCUTANEOUS | Status: AC
  Administered 2024-05-03: 1800 mg via SUBCUTANEOUS
  Filled 2024-05-03: qty 15

## 2024-05-03 MED ORDER — BORTEZOMIB CHEMO SQ INJECTION 3.5 MG (2.5MG/ML)
1.3000 mg/m2 | Freq: Once | INTRAMUSCULAR | Status: AC
  Administered 2024-05-03: 2.5 mg via SUBCUTANEOUS
  Filled 2024-05-03: qty 1

## 2024-05-03 MED ORDER — DIPHENHYDRAMINE HCL 25 MG PO CAPS: 50.0000 mg | ORAL_CAPSULE | Freq: Once | ORAL | Status: DC

## 2024-05-03 NOTE — Progress Notes (Signed)
 Hematology and Oncology Follow Up Visit  [name] [id number] 12-23-1945 78 y.o. 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  His blood sugars continue to be a real problem.  His blood sugar today was 284.  He did have breakfast this morning.  He is on Mounjaro and Jardiance.  I am not sure why they are not working that well.  I told him that he really needs to see his family doctor to get all that sorted out.  He has done well with his treatment for myeloma.  When we last saw him, his monoclonal spike was 0.9 g/dL.  His IgG level was 1300 mg/dL.  The Kappa light chain was 7 mg/dL.  He has gotten IV iron.  This really has helped his hemoglobin.[name]  He is on testosterone  which is also helping.  He has had a good appetite.  He has had no change in bowel or bladder habits.  He has had no rashes.  There is been no bleeding.  He has had no cough or shortness of breath.  There has been no mouth sores.  He has had no leg swelling.  Overall, his performance status is ECOG 1.  Overall, I would say that his performance status is   Wt Readings from Last 3 Encounters:  05/03/24 165 lb 6.4 oz (75 kg)  04/05/24 (P) 171 lb 6.4 oz (77.7 kg)  03/08/24 172 lb 12.8 oz (78.4 kg)    Medications:  Allergies as of 05/03/2024       Reactions   Clindamycin Hcl Itching   Famvir  [famciclovir ] Itching   Metoprolol  Succinate Other (See Comments)   Pt does not recall.        Medication List        Accurate as of May 03, 2024  9:33 AM. If you have any questions, ask your nurse or doctor.          aspirin  EC 325 MG  tablet Take 325 mg by mouth daily.   cetirizine 10 MG chewable tablet Commonly known as: ZYRTEC Chew 10 mg by mouth daily.   diphenhydrAMINE  25 mg capsule Commonly known as: BENADRYL  Take 50-75 mg by mouth every 6 (six) hours as needed (before tx).   docusate sodium  100 MG capsule Commonly known as: COLACE Take 100 mg by mouth daily as needed for mild constipation.   ezetimibe 10 MG tablet Commonly known as: ZETIA Take 10 mg by mouth daily.   hydrOXYzine  10 MG tablet Commonly known as: ATARAX  Take 1 tablet (10 mg total) by mouth every 8 (eight) hours as needed.   Jardiance 25 MG Tabs tablet Generic drug: empagliflozin Take 25 mg by mouth daily.   lenalidomide  15 MG capsule Commonly known as: REVLIMID  Take 1 cap by mouth once daily for 14 days on, then 14 days off. Auth# [id number]   Mounjaro 10 MG/0.5ML Pen Generic drug: tirzepatide Inject 10 mg into the skin once a week.   omeprazole  20 MG capsule Commonly known as: PRILOSEC Take 20 mg by mouth 2 (two) times daily before a meal.  ondansetron  8 MG tablet Commonly known as: Zofran  Take 1 tablet (8 mg total) by mouth every 8 (eight) hours as needed for nausea or vomiting.   rosuvastatin  40 MG tablet Commonly known as: CRESTOR  Take 40 mg by mouth daily.   Testosterone  25 MG/2.5GM (1%) Gel Place 1 mg onto the skin every other day.   triamterene-hydrochlorothiazide 37.5-25 MG tablet Commonly known as: MAXZIDE-25 Take 1 tablet by mouth every morning.   valsartan 320 MG tablet Commonly known as: DIOVAN Take 320 mg by mouth daily.        Allergies:  Allergies  Allergen Reactions   Clindamycin Hcl Itching   Famvir  [Famciclovir ] Itching   Metoprolol  Succinate Other (See Comments)    Pt does not recall.    Past Medical History, Surgical history, Social history, and Family History were reviewed and updated.  Review of Systems: Review of Systems  Constitutional: Negative.   HENT: Negative.    Eyes:  Negative.   Respiratory: Negative.    Cardiovascular: Negative.   Gastrointestinal: Negative.   Genitourinary: Negative.   Musculoskeletal: Negative.   Skin: Negative.   Neurological: Negative.   Endo/Heme/Allergies: Negative.   Psychiatric/Behavioral: Negative.        Physical Exam:  Vital signs show a temperature of 97.6.  Pulse 63.  Blood pressure 114/62.  Weight is 165 pounds .   Wt Readings from Last 3 Encounters:  05/03/24 165 lb 6.4 oz (75 kg)  04/05/24 (P) 171 lb 6.4 oz (77.7 kg)  03/08/24 172 lb 12.8 oz (78.4 kg)    Physical Exam Vitals reviewed.  HENT:     Head: Normocephalic and atraumatic.  Eyes:     Pupils: Pupils are equal, round, and reactive to light.  Cardiovascular:     Rate and Rhythm: Normal rate and regular rhythm.     Heart sounds: Normal heart sounds.  Pulmonary:     Effort: Pulmonary effort is normal.     Breath sounds: Normal breath sounds.  Abdominal:     General: Bowel sounds are normal.     Palpations: Abdomen is soft.  Musculoskeletal:        General: No tenderness or deformity. Normal range of motion.     Cervical back: Normal range of motion.  Lymphadenopathy:     Cervical: No cervical adenopathy.  Skin:    General: Skin is warm and dry.     Findings: No erythema or rash.  Neurological:     Mental Status: He is alert and oriented to person, place, and time.  Psychiatric:        Behavior: Behavior normal.        Thought Content: Thought content normal.        Judgment: Judgment normal.      Lab Results  Component Value Date   WBC 5.1 05/03/2024   HGB 15.7 05/03/2024   HCT 47.0 05/03/2024   MCV 83.3 05/03/2024   PLT 129 (L) 05/03/2024   Lab Results  Component Value Date   FERRITIN 63 04/05/2024   IRON 67 04/05/2024   TIBC 427 04/05/2024   UIBC 360 04/05/2024   IRONPCTSAT 16 (L) 04/05/2024   Lab Results  Component Value Date   RETICCTPCT 1.1 01/12/2024   RBC 5.64 05/03/2024   Lab Results  Component Value Date    KPAFRELGTCHN 70.2 (H) 04/05/2024   LAMBDASER 8.1 04/05/2024   KAPLAMBRATIO 8.67 (H) 04/05/2024   Lab Results  Component Value Date   IGGSERUM 1,239 04/05/2024   IGGSERUM  1,364 04/05/2024   IGA 30 (L) 04/05/2024   IGA 32 (L) 04/05/2024   IGMSERUM 20 04/05/2024   IGMSERUM 20 04/05/2024   Lab Results  Component Value Date   TOTALPROTELP 6.6 04/05/2024   ALBUMINELP 3.6 04/05/2024   A1GS 0.2 04/05/2024   A2GS 0.8 04/05/2024   BETS 0.8 04/05/2024   GAMS 1.2 04/05/2024   MSPIKE 0.9 (H) 04/05/2024   SPEI Comment 05/20/2023     Chemistry      Component Value Date/Time   NA 137 05/03/2024 0842   NA 139 06/20/2020 0923   K 3.5 05/03/2024 0842   CL 100 05/03/2024 0842   CO2 24 05/03/2024 0842   BUN 22 05/03/2024 0842   BUN 17 06/20/2020 0923   CREATININE 1.22 05/03/2024 0842   CREATININE 1.12 09/03/2015 1201      Component Value Date/Time   CALCIUM  9.4 05/03/2024 0842   ALKPHOS 58 05/03/2024 0842   AST 18 05/03/2024 0842   ALT 17 05/03/2024 0842   BILITOT 0.4 05/03/2024 0842      Impression and Plan: Mr. Stgermaine is a very pleasant 78 yo caucasian gentleman with IgG kappa multiple myeloma.  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  His FISH panel was negative. Currently he is on daratumumab /Velcade /Revlimid  without Decadron .   Again, his blood sugars really need to be under better control.  Hopefully, his family doctor can help out with this.  I am not too worried about the myeloma.  I think he is done very well with this.  The Revlimid  on a 2-week on/2-week off schedule is certainly helping him.  We will now plan to get him back to see us  in another month.  I know he will have a wonderful Thanksgiving.    Maude JONELLE Crease, MD 11/5/20259:33 AM

## 2024-05-04 ENCOUNTER — Other Ambulatory Visit: Payer: Self-pay

## 2024-05-04 LAB — IGG, IGA, IGM
IgA: 28 mg/dL — ABNORMAL LOW (ref 61–437)
IgG (Immunoglobin G), Serum: 1105 mg/dL (ref 603–1613)
IgM (Immunoglobulin M), Srm: 18 mg/dL (ref 15–143)

## 2024-05-04 LAB — KAPPA/LAMBDA LIGHT CHAINS
Kappa free light chain: 55.3 mg/L — ABNORMAL HIGH (ref 3.3–19.4)
Kappa, lambda light chain ratio: 7.68 — ABNORMAL HIGH (ref 0.26–1.65)
Lambda free light chains: 7.2 mg/L (ref 5.7–26.3)

## 2024-05-05 LAB — TESTOSTERONE: Testosterone: 322 ng/dL (ref 264–916)

## 2024-05-08 ENCOUNTER — Other Ambulatory Visit (HOSPITAL_COMMUNITY): Payer: Self-pay

## 2024-05-08 ENCOUNTER — Telehealth: Payer: Self-pay

## 2024-05-08 ENCOUNTER — Encounter: Payer: Self-pay | Admitting: Family

## 2024-05-08 ENCOUNTER — Encounter: Payer: Self-pay | Admitting: Hematology & Oncology

## 2024-05-08 NOTE — Telephone Encounter (Signed)
 Oral Oncology Patient Advocate Encounter  Was successful in securing patient a $8000 grant from [facility] to provide copayment coverage for Lenalidomide .  This will keep the out of pocket expense at $0.     [employer] ID: [id number]   The billing information is as follows and has been shared with [facility] via email and [facility].   RxBin: [id number] PCN: [id number] Member ID: [id number] Group ID: [id number] Dates of Eligibility: 04/08/24 through 04/07/25  Fund:  Multiple Myeloma - Medicare Access   Charlott Hamilton,  CPhT-Adv  she/her/hers [facility] Pharmacy Technician Patient Advocate Specialist III [facility] Phone: [phone]  Fax: [phone] [email or web address]

## 2024-05-10 LAB — PROTEIN ELECTROPHORESIS, SERUM, WITH REFLEX
A/G Ratio: 1.3 (ref 0.7–1.7)
Albumin ELP: 3.7 g/dL (ref 2.9–4.4)
Alpha-1-Globulin: 0.2 g/dL (ref 0.0–0.4)
Alpha-2-Globulin: 0.7 g/dL (ref 0.4–1.0)
Beta Globulin: 0.8 g/dL (ref 0.7–1.3)
Gamma Globulin: 1 g/dL (ref 0.4–1.8)
Globulin, Total: 2.8 g/dL (ref 2.2–3.9)
M-Spike, %: 0.8 g/dL — ABNORMAL HIGH
SPEP Interpretation: 0
Total Protein ELP: 6.5 g/dL (ref 6.0–8.5)

## 2024-05-10 LAB — IMMUNOFIXATION REFLEX, SERUM
IgA: 27 mg/dL — ABNORMAL LOW (ref 61–437)
IgG (Immunoglobin G), Serum: 1148 mg/dL (ref 603–1613)
IgM (Immunoglobulin M), Srm: 19 mg/dL (ref 15–143)

## 2024-05-11 ENCOUNTER — Ambulatory Visit (INDEPENDENT_AMBULATORY_CARE_PROVIDER_SITE_OTHER): Payer: Medicare Other | Admitting: Otolaryngology

## 2024-05-11 ENCOUNTER — Encounter (INDEPENDENT_AMBULATORY_CARE_PROVIDER_SITE_OTHER): Payer: Self-pay | Admitting: Otolaryngology

## 2024-05-11 VITALS — BP 119/62 | HR 66 | Ht 68.0 in | Wt 167.0 lb

## 2024-05-11 DIAGNOSIS — H6123 Impacted cerumen, bilateral: Secondary | ICD-10-CM

## 2024-05-11 NOTE — Progress Notes (Signed)
 Patient ID: [name], male   DOB: [birth year], 78 y.o.   MRN: [id number]  Follow-up: Recurrent cerumen impaction  Procedure: Bilateral cerumen disimpaction.    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    Description: The patient is placed supine on the exam table.  Under the operating microscope, the right ear canal is examined and is noted to be completely impacted with cerumen.  The cerumen is carefully removed with a combination of suction catheters, cerumen curette, and alligator forceps.  After the cerumen removal, the ear canal and tympanic membrane are noted to be normal.  No middle ear effusion is noted.  The same procedure is then repeated on the left side without exception. The patient tolerated the procedure well.  Follow-up care:  The patient will follow up in 9 months.

## 2024-05-22 ENCOUNTER — Telehealth: Payer: Self-pay

## 2024-05-22 DIAGNOSIS — C9000 Multiple myeloma not having achieved remission: Secondary | ICD-10-CM

## 2024-05-22 MED ORDER — LENALIDOMIDE 15 MG PO CAPS: ORAL_CAPSULE | ORAL | 0 refills | Status: DC

## 2024-05-22 NOTE — Telephone Encounter (Signed)
 [facility] faxed refill request for Lenalidomide  15 mg # 14. Refill request sent. Auth # [id number].

## 2024-06-01 ENCOUNTER — Other Ambulatory Visit: Payer: Self-pay

## 2024-06-01 ENCOUNTER — Inpatient Hospital Stay

## 2024-06-01 ENCOUNTER — Inpatient Hospital Stay: Attending: Hematology & Oncology

## 2024-06-01 ENCOUNTER — Inpatient Hospital Stay: Admitting: Hematology & Oncology

## 2024-06-01 VITALS — BP 148/72 | HR 64 | Temp 98.1°F | Resp 18 | Ht 68.0 in | Wt 171.0 lb

## 2024-06-01 DIAGNOSIS — D443 Neoplasm of uncertain behavior of pituitary gland: Secondary | ICD-10-CM

## 2024-06-01 DIAGNOSIS — I10 Essential (primary) hypertension: Secondary | ICD-10-CM

## 2024-06-01 DIAGNOSIS — C9000 Multiple myeloma not having achieved remission: Secondary | ICD-10-CM | POA: Diagnosis not present

## 2024-06-01 DIAGNOSIS — D509 Iron deficiency anemia, unspecified: Secondary | ICD-10-CM | POA: Diagnosis not present

## 2024-06-01 DIAGNOSIS — Z79899 Other long term (current) drug therapy: Secondary | ICD-10-CM | POA: Insufficient documentation

## 2024-06-01 DIAGNOSIS — Q929 Trisomy and partial trisomy of autosomes, unspecified: Secondary | ICD-10-CM | POA: Diagnosis not present

## 2024-06-01 DIAGNOSIS — D649 Anemia, unspecified: Secondary | ICD-10-CM

## 2024-06-01 DIAGNOSIS — Z5112 Encounter for antineoplastic immunotherapy: Secondary | ICD-10-CM | POA: Diagnosis present

## 2024-06-01 LAB — CMP (CANCER CENTER ONLY)
ALT: 17 U/L (ref 0–44)
AST: 23 U/L (ref 15–41)
Albumin: 4.1 g/dL (ref 3.5–5.0)
Alkaline Phosphatase: 60 U/L (ref 38–126)
Anion gap: 9 (ref 5–15)
BUN: 19 mg/dL (ref 8–23)
CO2: 28 mmol/L (ref 22–32)
Calcium: 9.2 mg/dL (ref 8.9–10.3)
Chloride: 103 mmol/L (ref 98–111)
Creatinine: 1.12 mg/dL (ref 0.61–1.24)
GFR, Estimated: >60 mL/min (ref >60)
Glucose, Bld: 143 mg/dL — ABNORMAL HIGH (ref 70–99)
Potassium: 3.6 mmol/L (ref 3.5–5.1)
Sodium: 140 mmol/L (ref 135–145)
Total Bilirubin: 0.5 mg/dL (ref 0.0–1.2)
Total Protein: 6.6 g/dL (ref 6.5–8.1)

## 2024-06-01 LAB — CBC WITH DIFFERENTIAL (CANCER CENTER ONLY)
Abs Immature Granulocytes: 0.01 K/uL (ref 0.00–0.07)
Basophils Absolute: 0 K/uL (ref 0.0–0.1)
Basophils Relative: 1 %
Eosinophils Absolute: 0.1 K/uL (ref 0.0–0.5)
Eosinophils Relative: 2 %
HCT: 45.7 % (ref 39.0–52.0)
Hemoglobin: 15.6 g/dL (ref 13.0–17.0)
Immature Granulocytes: 0 %
Lymphocytes Relative: 26 %
Lymphs Abs: 1.5 K/uL (ref 0.7–4.0)
MCH: 28.9 pg (ref 26.0–34.0)
MCHC: 34.1 g/dL (ref 30.0–36.0)
MCV: 84.6 fL (ref 80.0–100.0)
Monocytes Absolute: 0.5 K/uL (ref 0.1–1.0)
Monocytes Relative: 10 %
Neutro Abs: 3.5 K/uL (ref 1.7–7.7)
Neutrophils Relative %: 61 %
Platelet Count: 157 K/uL (ref 150–400)
RBC: 5.4 MIL/uL (ref 4.22–5.81)
RDW: 16.4 % — ABNORMAL HIGH (ref 11.5–15.5)
WBC Count: 5.7 K/uL (ref 4.0–10.5)
nRBC: 0 % (ref 0.0–0.2)

## 2024-06-01 LAB — FERRITIN: Ferritin: 58 ng/mL (ref 24–336)

## 2024-06-01 LAB — IRON AND IRON BINDING CAPACITY (CC-WL,HP ONLY)
Iron: 81 ug/dL (ref 45–182)
Saturation Ratios: 21 % (ref 17.9–39.5)
TIBC: 392 ug/dL (ref 250–450)
UIBC: 311 ug/dL

## 2024-06-01 LAB — LACTATE DEHYDROGENASE: LDH: 192 U/L (ref 105–235)

## 2024-06-01 MED ORDER — DARATUMUMAB-HYALURONIDASE-FIHJ 1800-30000 MG-UT/15ML ~~LOC~~ SOLN
1800.0000 mg | Freq: Once | SUBCUTANEOUS | Status: AC
  Administered 2024-06-01: 1800 mg via SUBCUTANEOUS
  Filled 2024-06-01: qty 15

## 2024-06-01 MED ORDER — ACETAMINOPHEN 325 MG PO TABS: 650.0000 mg | ORAL_TABLET | Freq: Once | ORAL | Status: DC

## 2024-06-01 MED ORDER — DIPHENHYDRAMINE HCL 25 MG PO CAPS: 50.0000 mg | ORAL_CAPSULE | Freq: Once | ORAL | Status: DC

## 2024-06-01 MED ORDER — BORTEZOMIB CHEMO SQ INJECTION 3.5 MG (2.5MG/ML)
1.3000 mg/m2 | Freq: Once | INTRAMUSCULAR | Status: AC
  Administered 2024-06-01: 2.5 mg via SUBCUTANEOUS
  Filled 2024-06-01: qty 1

## 2024-06-01 NOTE — Progress Notes (Signed)
 Hematology and Oncology Follow Up Visit  [name] [id number] [birth year] 78 y.o. 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 is here today for follow-up.  He is doing okay.  Unfortunately, his sister passed away recently.  He is trying to manage her estate and taking care of a lot of her services.  He did have a very subdued Thanksgiving because of this.  When we last saw him, his monoclonal spike was 0.8 g/dL.  The IgG level was 1130 mg/dL.  The kappa light chain was 5.5 mg/dL.  This is all holding pretty steady.  His appetite has been okay.  He continues on the Mounjaro and Jardiance for his diabetes.  He has had no fever.  He has had no bleeding.  There is been no change in bowel or bladder habits.  He has had no leg swelling.   His last testosterone  level was 322 ng/dL  Overall, his performance status is ECOG 1.   Wt Readings from Last 3 Encounters:  05/11/24 167 lb (75.8 kg)  05/03/24 165 lb 6.4 oz (75 kg)  04/05/24 (P) 171 lb 6.4 oz (77.7 kg)    Medications:  Allergies as of 06/01/2024       Reactions   Clindamycin Hcl Itching   Famvir  [famciclovir ] Itching   Metoprolol  Succinate Other (See Comments)   Pt does not recall.        Medication List        Accurate as of June 01, 2024  8:08 AM. If you have any questions, ask your nurse or doctor.          aspirin  EC 325 MG tablet Take 325 mg by mouth daily.   cetirizine 10 MG chewable tablet Commonly known as: ZYRTEC Chew 10 mg by mouth daily.   diphenhydrAMINE  25 mg capsule Commonly known as: BENADRYL  Take 50-75 mg by mouth every 6 (six) hours as  needed (before tx).   docusate sodium  100 MG capsule Commonly known as: COLACE Take 100 mg by mouth daily as needed for mild constipation.   ezetimibe 10 MG tablet Commonly known as: ZETIA Take 10 mg by mouth daily.   hydrOXYzine  10 MG tablet Commonly known as: ATARAX  Take 1 tablet (10 mg total) by mouth every 8 (eight) hours as needed.   Jardiance 25 MG Tabs tablet Generic drug: empagliflozin Take 25 mg by mouth daily.   lenalidomide  15 MG capsule Commonly known as: REVLIMID  Take 1 cap by mouth once daily for 14 days on, then 14 days off. Auth# [id number]   Mounjaro 10 MG/0.5ML Pen Generic drug: tirzepatide Inject 10 mg into the skin once a week.   omeprazole  20 MG capsule Commonly known as: PRILOSEC Take 20 mg by mouth 2 (two) times daily before a meal.   ondansetron  8 MG tablet Commonly known as: Zofran  Take 1 tablet (8 mg total) by mouth every 8 (eight) hours as needed for nausea or vomiting.   rosuvastatin  40 MG tablet Commonly known as: CRESTOR  Take 40 mg by mouth daily.   Testosterone  25 MG/2.5GM (1%) Gel Place 1 mg onto the skin  every other day.   triamterene-hydrochlorothiazide 37.5-25 MG tablet Commonly known as: MAXZIDE-25 Take 1 tablet by mouth every morning.   valsartan 320 MG tablet Commonly known as: DIOVAN Take 320 mg by mouth daily.        Allergies:  Allergies  Allergen Reactions   Clindamycin Hcl Itching   Famvir  [Famciclovir ] Itching   Metoprolol  Succinate Other (See Comments)    Pt does not recall.    Past Medical History, Surgical history, Social history, and Family History were reviewed and updated.  Review of Systems: Review of Systems  Constitutional: Negative.   HENT: Negative.    Eyes: Negative.   Respiratory: Negative.    Cardiovascular: Negative.   Gastrointestinal: Negative.   Genitourinary: Negative.   Musculoskeletal: Negative.   Skin: Negative.   Neurological: Negative.   Endo/Heme/Allergies: Negative.    Psychiatric/Behavioral: Negative.        Physical Exam:  Vital signs show a temperature of 98.  Pulse 66.  Blood pressure 119/62.  Weight is 167 pounds.   Wt Readings from Last 3 Encounters:  05/11/24 167 lb (75.8 kg)  05/03/24 165 lb 6.4 oz (75 kg)  04/05/24 (P) 171 lb 6.4 oz (77.7 kg)    Physical Exam Vitals reviewed.  HENT:     Head: Normocephalic and atraumatic.  Eyes:     Pupils: Pupils are equal, round, and reactive to light.  Cardiovascular:     Rate and Rhythm: Normal rate and regular rhythm.     Heart sounds: Normal heart sounds.  Pulmonary:     Effort: Pulmonary effort is normal.     Breath sounds: Normal breath sounds.  Abdominal:     General: Bowel sounds are normal.     Palpations: Abdomen is soft.  Musculoskeletal:        General: No tenderness or deformity. Normal range of motion.     Cervical back: Normal range of motion.  Lymphadenopathy:     Cervical: No cervical adenopathy.  Skin:    General: Skin is warm and dry.     Findings: No erythema or rash.  Neurological:     Mental Status: He is alert and oriented to person, place, and time.  Psychiatric:        Behavior: Behavior normal.        Thought Content: Thought content normal.        Judgment: Judgment normal.      Lab Results  Component Value Date   WBC 5.7 06/01/2024   HGB 15.6 06/01/2024   HCT 45.7 06/01/2024   MCV 84.6 06/01/2024   PLT 157 06/01/2024   Lab Results  Component Value Date   FERRITIN 224 05/03/2024   IRON 87 05/03/2024   TIBC 403 05/03/2024   UIBC 316 05/03/2024   IRONPCTSAT 22 05/03/2024   Lab Results  Component Value Date   RETICCTPCT 1.1 01/12/2024   RBC 5.40 06/01/2024   Lab Results  Component Value Date   KPAFRELGTCHN 55.3 (H) 05/03/2024   LAMBDASER 7.2 05/03/2024   KAPLAMBRATIO 7.68 (H) 05/03/2024   Lab Results  Component Value Date   IGGSERUM 1,105 05/03/2024   IGGSERUM 1,148 05/03/2024   IGA 28 (L) 05/03/2024   IGA 27 (L) 05/03/2024    IGMSERUM 18 05/03/2024   IGMSERUM 19 05/03/2024   Lab Results  Component Value Date   TOTALPROTELP 6.5 05/03/2024   ALBUMINELP 3.7 05/03/2024   A1GS 0.2 05/03/2024   A2GS 0.7 05/03/2024   BETS 0.8 05/03/2024   GAMS  1.0 05/03/2024   MSPIKE 0.8 (H) 05/03/2024   SPEI Comment 05/20/2023     Chemistry      Component Value Date/Time   NA 137 05/03/2024 0842   NA 139 06/20/2020 0923   K 3.5 05/03/2024 0842   CL 100 05/03/2024 0842   CO2 24 05/03/2024 0842   BUN 22 05/03/2024 0842   BUN 17 06/20/2020 0923   CREATININE 1.22 05/03/2024 0842   CREATININE 1.12 09/03/2015 1201      Component Value Date/Time   CALCIUM  9.4 05/03/2024 0842   ALKPHOS 58 05/03/2024 0842   AST 18 05/03/2024 0842   ALT 17 05/03/2024 0842   BILITOT 0.4 05/03/2024 0842      Impression and Plan: Mr. Wender is a very pleasant 78 yo caucasian gentleman with IgG kappa multiple myeloma.  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  His FISH panel was negative. Currently he is on daratumumab /Velcade /Revlimid  without Decadron .   Overall, I really think everything is going well.  I hate the fact that his sister passed away.  I know that he is doing his best to help with the estate.  We will now plan to get him back after the Christmas and New Year holiday.  Hopefully, next year will be a nice quiet year for him.  I know that he is planning on going to a [place] cruise in February.  Maude JONELLE Crease, MD 12/4/20258:08 AM

## 2024-06-01 NOTE — Patient Instructions (Signed)
 [facility] - A DEPT OF [facility]  Discharge Instructions: Thank you for choosing [facility] to provide your oncology and hematology care.   If you have a lab appointment with the Cancer Center, please go directly to the Cancer Center and check in at the registration area.  Wear comfortable clothing and clothing appropriate for easy access to any Portacath or PICC line.   We strive to give you quality time with your provider. You may need to reschedule your appointment if you arrive late (15 or more minutes).  Arriving late affects you and other patients whose appointments are after yours.  Also, if you miss three or more appointments without notifying the office, you may be dismissed from the clinic at the provider's discretion.      For prescription refill requests, have your pharmacy contact our office and allow 72 hours for refills to be completed.    Today you received the following chemotherapy and/or immunotherapy agents Velcade/Darzalex      To help prevent nausea and vomiting after your treatment, we encourage you to take your nausea medication as directed.  BELOW ARE SYMPTOMS THAT SHOULD BE REPORTED IMMEDIATELY: *FEVER GREATER THAN 100.4 F (38 C) OR HIGHER *CHILLS OR SWEATING *NAUSEA AND VOMITING THAT IS NOT CONTROLLED WITH YOUR NAUSEA MEDICATION *UNUSUAL SHORTNESS OF BREATH *UNUSUAL BRUISING OR BLEEDING *URINARY PROBLEMS (pain or burning when urinating, or frequent urination) *BOWEL PROBLEMS (unusual diarrhea, constipation, pain near the anus) TENDERNESS IN MOUTH AND THROAT WITH OR WITHOUT PRESENCE OF ULCERS (sore throat, sores in mouth, or a toothache) UNUSUAL RASH, SWELLING OR PAIN  UNUSUAL VAGINAL DISCHARGE OR ITCHING   Items with * indicate a potential emergency and should be followed up as soon as possible or go to the Emergency Department if any problems should occur.  Please show the CHEMOTHERAPY ALERT CARD or  IMMUNOTHERAPY ALERT CARD at check-in to the Emergency Department and triage nurse. Should you have questions after your visit or need to cancel or reschedule your appointment, please contact [facility] - A DEPT OF [facility]  [phone] and follow the prompts.  Office hours are 8:00 a.m. to 4:30 p.m. Monday - Friday. Please note that voicemails left after 4:00 p.m. may not be returned until the following business day.  We are closed weekends and major holidays. You have access to a nurse at all times for urgent questions. Please call the main number to the clinic [phone] and follow the prompts.  For any non-urgent questions, you may also contact your provider using MyChart. We now offer e-Visits for anyone 71 and older to request care online for non-urgent symptoms. For details visit [email or web address].   Also download the MyChart app! Go to the app store, search "MyChart", open the app, select , and log in with your MyChart username and password.

## 2024-06-02 ENCOUNTER — Ambulatory Visit: Payer: Self-pay | Admitting: Hematology & Oncology

## 2024-06-02 LAB — KAPPA/LAMBDA LIGHT CHAINS
Kappa free light chain: 40.1 mg/L — ABNORMAL HIGH (ref 3.3–19.4)
Kappa, lambda light chain ratio: 6.27 — ABNORMAL HIGH (ref 0.26–1.65)
Lambda free light chains: 6.4 mg/L (ref 5.7–26.3)

## 2024-06-02 LAB — IGG, IGA, IGM
IgA: 35 mg/dL — ABNORMAL LOW (ref 61–437)
IgG (Immunoglobin G), Serum: 1089 mg/dL (ref 603–1613)
IgM (Immunoglobulin M), Srm: 31 mg/dL (ref 15–143)

## 2024-06-02 NOTE — Telephone Encounter (Signed)
-----   Message from Maude JONELLE Crease sent at 06/02/2024  3:02 PM EST ----- Please call and let him know that the light chains are coming down so nicely.  They were 55 back in November.  Now they are 40.  Excellent job.  Jeralyn ----- Message ----- From: Rebecka, Lab In [place] Sent: 06/01/2024   8:04 AM EST To: Maude JONELLE Crease, MD

## 2024-06-02 NOTE — Telephone Encounter (Signed)
 Advised via MyChart.

## 2024-06-06 LAB — PROTEIN ELECTROPHORESIS, SERUM, WITH REFLEX
A/G Ratio: 1.4 (ref 0.7–1.7)
Albumin ELP: 3.7 g/dL (ref 2.9–4.4)
Alpha-1-Globulin: 0.2 g/dL (ref 0.0–0.4)
Alpha-2-Globulin: 0.8 g/dL (ref 0.4–1.0)
Beta Globulin: 0.8 g/dL (ref 0.7–1.3)
Gamma Globulin: 0.9 g/dL (ref 0.4–1.8)
Globulin, Total: 2.7 g/dL (ref 2.2–3.9)
M-Spike, %: 0.7 g/dL — ABNORMAL HIGH
SPEP Interpretation: 0
Total Protein ELP: 6.4 g/dL (ref 6.0–8.5)

## 2024-06-06 LAB — IMMUNOFIXATION REFLEX, SERUM
IgA: 35 mg/dL — ABNORMAL LOW (ref 61–437)
IgG (Immunoglobin G), Serum: 1152 mg/dL (ref 603–1613)
IgM (Immunoglobulin M), Srm: 35 mg/dL (ref 15–143)

## 2024-06-21 ENCOUNTER — Other Ambulatory Visit: Payer: Self-pay | Admitting: *Deleted

## 2024-06-21 DIAGNOSIS — C9000 Multiple myeloma not having achieved remission: Secondary | ICD-10-CM

## 2024-06-21 MED ORDER — LENALIDOMIDE 15 MG PO CAPS: ORAL_CAPSULE | ORAL | 0 refills | Status: DC

## 2024-06-28 ENCOUNTER — Inpatient Hospital Stay: Admitting: Hematology & Oncology

## 2024-06-28 ENCOUNTER — Inpatient Hospital Stay

## 2024-07-07 ENCOUNTER — Encounter: Payer: Self-pay | Admitting: Hematology & Oncology

## 2024-07-07 ENCOUNTER — Inpatient Hospital Stay: Admitting: Hematology & Oncology

## 2024-07-07 ENCOUNTER — Other Ambulatory Visit: Payer: Self-pay

## 2024-07-07 ENCOUNTER — Inpatient Hospital Stay: Attending: Hematology & Oncology

## 2024-07-07 ENCOUNTER — Inpatient Hospital Stay

## 2024-07-07 VITALS — BP 126/53 | HR 65 | Temp 98.1°F | Resp 18 | Ht 68.0 in | Wt 169.0 lb

## 2024-07-07 DIAGNOSIS — E291 Testicular hypofunction: Secondary | ICD-10-CM | POA: Diagnosis not present

## 2024-07-07 DIAGNOSIS — Q929 Trisomy and partial trisomy of autosomes, unspecified: Secondary | ICD-10-CM | POA: Insufficient documentation

## 2024-07-07 DIAGNOSIS — Z79899 Other long term (current) drug therapy: Secondary | ICD-10-CM | POA: Insufficient documentation

## 2024-07-07 DIAGNOSIS — Z5112 Encounter for antineoplastic immunotherapy: Secondary | ICD-10-CM | POA: Diagnosis present

## 2024-07-07 DIAGNOSIS — C9000 Multiple myeloma not having achieved remission: Secondary | ICD-10-CM

## 2024-07-07 DIAGNOSIS — D443 Neoplasm of uncertain behavior of pituitary gland: Secondary | ICD-10-CM

## 2024-07-07 DIAGNOSIS — D649 Anemia, unspecified: Secondary | ICD-10-CM

## 2024-07-07 DIAGNOSIS — D509 Iron deficiency anemia, unspecified: Secondary | ICD-10-CM | POA: Diagnosis not present

## 2024-07-07 DIAGNOSIS — I10 Essential (primary) hypertension: Secondary | ICD-10-CM

## 2024-07-07 LAB — CMP (CANCER CENTER ONLY)
ALT: 15 U/L (ref 0–44)
AST: 19 U/L (ref 15–41)
Albumin: 4.4 g/dL (ref 3.5–5.0)
Alkaline Phosphatase: 65 U/L (ref 38–126)
Anion gap: 11 (ref 5–15)
BUN: 17 mg/dL (ref 8–23)
CO2: 28 mmol/L (ref 22–32)
Calcium: 9.8 mg/dL (ref 8.9–10.3)
Chloride: 100 mmol/L (ref 98–111)
Creatinine: 1.08 mg/dL (ref 0.61–1.24)
GFR, Estimated: >60 mL/min
Glucose, Bld: 220 mg/dL — ABNORMAL HIGH (ref 70–99)
Potassium: 3.7 mmol/L (ref 3.5–5.1)
Sodium: 139 mmol/L (ref 135–145)
Total Bilirubin: 0.5 mg/dL (ref 0.0–1.2)
Total Protein: 6.8 g/dL (ref 6.5–8.1)

## 2024-07-07 LAB — CBC WITH DIFFERENTIAL (CANCER CENTER ONLY)
Abs Immature Granulocytes: 0.01 K/uL (ref 0.00–0.07)
Basophils Absolute: 0 K/uL (ref 0.0–0.1)
Basophils Relative: 1 %
Eosinophils Absolute: 0.2 K/uL (ref 0.0–0.5)
Eosinophils Relative: 4 %
HCT: 48.3 % (ref 39.0–52.0)
Hemoglobin: 16.3 g/dL (ref 13.0–17.0)
Immature Granulocytes: 0 %
Lymphocytes Relative: 25 %
Lymphs Abs: 1.3 K/uL (ref 0.7–4.0)
MCH: 29.2 pg (ref 26.0–34.0)
MCHC: 33.7 g/dL (ref 30.0–36.0)
MCV: 86.6 fL (ref 80.0–100.0)
Monocytes Absolute: 0.5 K/uL (ref 0.1–1.0)
Monocytes Relative: 10 %
Neutro Abs: 3.1 K/uL (ref 1.7–7.7)
Neutrophils Relative %: 60 %
Platelet Count: 145 K/uL — ABNORMAL LOW (ref 150–400)
RBC: 5.58 MIL/uL (ref 4.22–5.81)
RDW: 14.7 % (ref 11.5–15.5)
WBC Count: 5.2 K/uL (ref 4.0–10.5)
nRBC: 0 % (ref 0.0–0.2)

## 2024-07-07 LAB — LACTATE DEHYDROGENASE: LDH: 151 U/L (ref 105–235)

## 2024-07-07 LAB — IRON AND IRON BINDING CAPACITY (CC-WL,HP ONLY)
Iron: 117 ug/dL (ref 45–182)
Saturation Ratios: 28 % (ref 17.9–39.5)
TIBC: 414 ug/dL (ref 250–450)
UIBC: 297 ug/dL

## 2024-07-07 LAB — FERRITIN: Ferritin: 56 ng/mL (ref 24–336)

## 2024-07-07 MED ORDER — DARATUMUMAB-HYALURONIDASE-FIHJ 1800-30000 MG-UT/15ML ~~LOC~~ SOLN
1800.0000 mg | Freq: Once | SUBCUTANEOUS | Status: AC
  Administered 2024-07-07: 1800 mg via SUBCUTANEOUS
  Filled 2024-07-07: qty 15

## 2024-07-07 MED ORDER — DIPHENHYDRAMINE HCL 25 MG PO CAPS: 50.0000 mg | ORAL_CAPSULE | Freq: Once | ORAL | Status: DC

## 2024-07-07 MED ORDER — BORTEZOMIB CHEMO SQ INJECTION 3.5 MG (2.5MG/ML)
1.3000 mg/m2 | Freq: Once | INTRAMUSCULAR | Status: AC
  Administered 2024-07-07: 2.5 mg via SUBCUTANEOUS
  Filled 2024-07-07: qty 1

## 2024-07-07 MED ORDER — BELSOMRA 10 MG PO TABS: 10.0000 mg | ORAL_TABLET | Freq: Every evening | ORAL | 0 refills | Status: AC | PRN

## 2024-07-07 MED ORDER — ACETAMINOPHEN 325 MG PO TABS: 650.0000 mg | ORAL_TABLET | Freq: Once | ORAL | Status: DC

## 2024-07-07 NOTE — Patient Instructions (Signed)
 [facility] - A DEPT OF [facility]  Discharge Instructions: Thank you for choosing [facility] to provide your oncology and hematology care.   If you have a lab appointment with the Cancer Center, please go directly to the Cancer Center and check in at the registration area.  Wear comfortable clothing and clothing appropriate for easy access to any Portacath or PICC line.   We strive to give you quality time with your provider. You may need to reschedule your appointment if you arrive late (15 or more minutes).  Arriving late affects you and other patients whose appointments are after yours.  Also, if you miss three or more appointments without notifying the office, you may be dismissed from the clinic at the providers discretion.      For prescription refill requests, have your pharmacy contact our office and allow 72 hours for refills to be completed.    Today you received the following chemotherapy and/or immunotherapy agents Bortezomib /Daratumumab       To help prevent nausea and vomiting after your treatment, we encourage you to take your nausea medication as directed.  BELOW ARE SYMPTOMS THAT SHOULD BE REPORTED IMMEDIATELY: *FEVER GREATER THAN 100.4 F (38 C) OR HIGHER *CHILLS OR SWEATING *NAUSEA AND VOMITING THAT IS NOT CONTROLLED WITH YOUR NAUSEA MEDICATION *UNUSUAL SHORTNESS OF BREATH *UNUSUAL BRUISING OR BLEEDING *URINARY PROBLEMS (pain or burning when urinating, or frequent urination) *BOWEL PROBLEMS (unusual diarrhea, constipation, pain near the anus) TENDERNESS IN MOUTH AND THROAT WITH OR WITHOUT PRESENCE OF ULCERS (sore throat, sores in mouth, or a toothache) UNUSUAL RASH, SWELLING OR PAIN  UNUSUAL VAGINAL DISCHARGE OR ITCHING   Items with * indicate a potential emergency and should be followed up as soon as possible or go to the Emergency Department if any problems should occur.  Please show the CHEMOTHERAPY ALERT CARD or  IMMUNOTHERAPY ALERT CARD at check-in to the Emergency Department and triage nurse. Should you have questions after your visit or need to cancel or reschedule your appointment, please contact [facility] - A DEPT OF [facility]  [phone] and follow the prompts.  Office hours are 8:00 a.m. to 4:30 p.m. Monday - Friday. Please note that voicemails left after 4:00 p.m. may not be returned until the following business day.  We are closed weekends and major holidays. You have access to a nurse at all times for urgent questions. Please call the main number to the clinic [phone] and follow the prompts.  For any non-urgent questions, you may also contact your provider using MyChart. We now offer e-Visits for anyone 70 and older to request care online for non-urgent symptoms. For details visit [email or web address].   Also download the MyChart app! Go to the app store, search MyChart, open the app, select [place], and log in with your MyChart username and password.

## 2024-07-07 NOTE — Progress Notes (Signed)
 Pt. arrived for tmt and taken premeds at home around 6:30am. Saw Md today and doing well.

## 2024-07-07 NOTE — Progress Notes (Signed)
 " Hematology and Oncology Follow Up Visit  [name] [id number] Jan 28, 1946 79 y.o. 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  He was visiting over the holiday season.  He is still managing his sister's estate.  His myeloma has been doing quite well.  His last monoclonal spike was 0.7 g/dL.  The IgG level was 1110 mg/dL.  The Kappa light chain was 4 mg/dL.  He has had no problems with nausea or vomiting.  He is not sleeping all that well.  Will call in some Belsomra  for him.  He has had no rashes.  He has had no bleeding.  There is been no change in bowel or bladder habits.  He has had no leg swelling.  He has had no headache.  Overall, I will say that his performance status is probably ECOG 1.     Wt Readings from Last 3 Encounters:  06/01/24 171 lb (77.6 kg)  05/11/24 167 lb (75.8 kg)  05/03/24 165 lb 6.4 oz (75 kg)    Medications:  Allergies as of 07/07/2024       Reactions   Clindamycin Hcl Itching   Famvir  [famciclovir ] Itching   Metoprolol  Succinate Other (See Comments)   Pt does not recall.        Medication List        Accurate as of July 07, 2024  8:04 AM. If you have any questions, ask your nurse or doctor.          STOP taking these medications    cetirizine 10 MG chewable tablet Commonly known as: ZYRTEC Stopped by: Maude Crease, MD       TAKE these medications    ALPRAZolam 0.5 MG tablet Commonly known as: XANAX Take 0.5 mg by mouth at bedtime as needed.   aspirin  EC 325 MG tablet Take 325 mg by mouth daily.   diphenhydrAMINE  25 mg capsule Commonly known as:  BENADRYL  Take 50-75 mg by mouth every 6 (six) hours as needed (before tx).   docusate sodium  100 MG capsule Commonly known as: COLACE Take 100 mg by mouth daily as needed for mild constipation.   ezetimibe 10 MG tablet Commonly known as: ZETIA Take 10 mg by mouth daily.   hydrOXYzine  10 MG tablet Commonly known as: ATARAX  Take 1 tablet (10 mg total) by mouth every 8 (eight) hours as needed.   Jardiance 25 MG Tabs tablet Generic drug: empagliflozin Take 25 mg by mouth daily.   lenalidomide  15 MG capsule Commonly known as: REVLIMID  Take 1 cap by mouth once daily for 14 days on, then 14 days off. Auth# [id number]   Mounjaro 10 MG/0.5ML Pen Generic drug: tirzepatide Inject 10 mg into the skin once a week.   omeprazole  20 MG capsule Commonly known as: PRILOSEC Take 20 mg by mouth 2 (two) times daily before a meal.   ondansetron  8 MG tablet Commonly known as: Zofran  Take 1 tablet (8 mg total) by mouth every 8 (eight) hours as needed for nausea or vomiting.   rosuvastatin  40 MG tablet Commonly known as: CRESTOR   Take 40 mg by mouth daily.   Testosterone  25 MG/2.5GM (1%) Gel Place 1 mg onto the skin every other day.   triamterene-hydrochlorothiazide 37.5-25 MG tablet Commonly known as: MAXZIDE-25 Take 1 tablet by mouth every morning.   valsartan 320 MG tablet Commonly known as: DIOVAN Take 320 mg by mouth daily.        Allergies:  Allergies  Allergen Reactions   Clindamycin Hcl Itching   Famvir  [Famciclovir ] Itching   Metoprolol  Succinate Other (See Comments)    Pt does not recall.    Past Medical History, Surgical history, Social history, and Family History were reviewed and updated.  Review of Systems: Review of Systems  Constitutional: Negative.   HENT: Negative.    Eyes: Negative.   Respiratory: Negative.    Cardiovascular: Negative.   Gastrointestinal: Negative.   Genitourinary: Negative.   Musculoskeletal: Negative.   Skin: Negative.    Neurological: Negative.   Endo/Heme/Allergies: Negative.   Psychiatric/Behavioral: Negative.        Physical Exam:  Vital signs show a temperature of 98.1.  Pulse 65.  Blood pressure 126/53.  Weight is 169 pounds.    Wt Readings from Last 3 Encounters:  06/01/24 171 lb (77.6 kg)  05/11/24 167 lb (75.8 kg)  05/03/24 165 lb 6.4 oz (75 kg)    Physical Exam Vitals reviewed.  HENT:     Head: Normocephalic and atraumatic.  Eyes:     Pupils: Pupils are equal, round, and reactive to light.  Cardiovascular:     Rate and Rhythm: Normal rate and regular rhythm.     Heart sounds: Normal heart sounds.  Pulmonary:     Effort: Pulmonary effort is normal.     Breath sounds: Normal breath sounds.  Abdominal:     General: Bowel sounds are normal.     Palpations: Abdomen is soft.  Musculoskeletal:        General: No tenderness or deformity. Normal range of motion.     Cervical back: Normal range of motion.  Lymphadenopathy:     Cervical: No cervical adenopathy.  Skin:    General: Skin is warm and dry.     Findings: No erythema or rash.  Neurological:     Mental Status: He is alert and oriented to person, place, and time.  Psychiatric:        Behavior: Behavior normal.        Thought Content: Thought content normal.        Judgment: Judgment normal.      Lab Results  Component Value Date   WBC 5.7 06/01/2024   HGB 15.6 06/01/2024   HCT 45.7 06/01/2024   MCV 84.6 06/01/2024   PLT 157 06/01/2024   Lab Results  Component Value Date   FERRITIN 58 06/01/2024   IRON 81 06/01/2024   TIBC 392 06/01/2024   UIBC 311 06/01/2024   IRONPCTSAT 21 06/01/2024   Lab Results  Component Value Date   RETICCTPCT 1.1 01/12/2024   RBC 5.40 06/01/2024   Lab Results  Component Value Date   KPAFRELGTCHN 40.1 (H) 06/01/2024   LAMBDASER 6.4 06/01/2024   KAPLAMBRATIO 6.27 (H) 06/01/2024   Lab Results  Component Value Date   IGGSERUM 1,089 06/01/2024   IGGSERUM 1,152 06/01/2024   IGA  35 (L) 06/01/2024   IGA 35 (L) 06/01/2024   IGMSERUM 31 06/01/2024   IGMSERUM 35 06/01/2024   Lab Results  Component Value Date   TOTALPROTELP 6.4 06/01/2024   ALBUMINELP 3.7 06/01/2024  A1GS 0.2 06/01/2024   A2GS 0.8 06/01/2024   BETS 0.8 06/01/2024   GAMS 0.9 06/01/2024   MSPIKE 0.7 (H) 06/01/2024   SPEI Comment 05/20/2023     Chemistry      Component Value Date/Time   NA 140 06/01/2024 0752   NA 139 06/20/2020 0923   K 3.6 06/01/2024 0752   CL 103 06/01/2024 0752   CO2 28 06/01/2024 0752   BUN 19 06/01/2024 0752   BUN 17 06/20/2020 0923   CREATININE 1.12 06/01/2024 0752   CREATININE 1.12 09/03/2015 1201      Component Value Date/Time   CALCIUM  9.2 06/01/2024 0752   ALKPHOS 60 06/01/2024 0752   AST 23 06/01/2024 0752   ALT 17 06/01/2024 0752   BILITOT 0.5 06/01/2024 0752      Impression and Plan: Mr. Eudy is a very pleasant 79 yo caucasian gentleman with IgG kappa multiple myeloma.  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  His FISH panel was negative. Currently he is on daratumumab /Velcade /Revlimid  without Decadron .   Overall, I really think he is doing nicely.  His monoclonal studies have been doing well.  I forgot to mention that he does have some watery eyes.  I am sure this is probably from treatment.  He is going go on a cruise in mid February.  We will plan to see him back before he goes.    Maude JONELLE Crease, MD 1/9/20268:04 AM "

## 2024-07-08 LAB — TESTOSTERONE: Testosterone: 171 ng/dL — ABNORMAL LOW (ref 264–916)

## 2024-07-09 LAB — IGG, IGA, IGM
IgA: 38 mg/dL — ABNORMAL LOW (ref 61–437)
IgG (Immunoglobin G), Serum: 1184 mg/dL (ref 603–1613)
IgM (Immunoglobulin M), Srm: 37 mg/dL (ref 15–143)

## 2024-07-10 LAB — KAPPA/LAMBDA LIGHT CHAINS
Kappa free light chain: 65.6 mg/L — ABNORMAL HIGH (ref 3.3–19.4)
Kappa, lambda light chain ratio: 6.98 — ABNORMAL HIGH (ref 0.26–1.65)
Lambda free light chains: 9.4 mg/L (ref 5.7–26.3)

## 2024-07-13 LAB — PROTEIN ELECTROPHORESIS, SERUM, WITH REFLEX
A/G Ratio: 1.3 (ref 0.7–1.7)
Albumin ELP: 3.6 g/dL (ref 2.9–4.4)
Alpha-1-Globulin: 0.2 g/dL (ref 0.0–0.4)
Alpha-2-Globulin: 0.8 g/dL (ref 0.4–1.0)
Beta Globulin: 0.8 g/dL (ref 0.7–1.3)
Gamma Globulin: 1 g/dL (ref 0.4–1.8)
Globulin, Total: 2.8 g/dL (ref 2.2–3.9)
M-Spike, %: 0.5 g/dL — ABNORMAL HIGH
SPEP Interpretation: 0
Total Protein ELP: 6.4 g/dL (ref 6.0–8.5)

## 2024-07-13 LAB — IMMUNOFIXATION REFLEX, SERUM
IgA: 34 mg/dL — ABNORMAL LOW (ref 61–437)
IgG (Immunoglobin G), Serum: 1133 mg/dL (ref 603–1613)
IgM (Immunoglobulin M), Srm: 29 mg/dL (ref 15–143)

## 2024-07-24 ENCOUNTER — Other Ambulatory Visit: Payer: Self-pay | Admitting: Hematology & Oncology

## 2024-07-24 DIAGNOSIS — C9000 Multiple myeloma not having achieved remission: Secondary | ICD-10-CM

## 2024-07-25 ENCOUNTER — Other Ambulatory Visit: Payer: Self-pay | Admitting: *Deleted

## 2024-07-25 DIAGNOSIS — C9000 Multiple myeloma not having achieved remission: Secondary | ICD-10-CM

## 2024-07-25 MED ORDER — LENALIDOMIDE 15 MG PO CAPS: ORAL_CAPSULE | ORAL | 0 refills | Status: AC

## 2024-08-03 ENCOUNTER — Encounter: Payer: Self-pay | Admitting: Hematology & Oncology

## 2024-08-03 ENCOUNTER — Inpatient Hospital Stay

## 2024-08-03 ENCOUNTER — Inpatient Hospital Stay: Attending: Hematology & Oncology

## 2024-08-03 ENCOUNTER — Inpatient Hospital Stay: Admitting: Hematology & Oncology

## 2024-08-03 VITALS — BP 153/70 | HR 62 | Temp 97.7°F | Resp 20 | Ht 68.0 in | Wt 169.0 lb

## 2024-08-03 DIAGNOSIS — C9000 Multiple myeloma not having achieved remission: Secondary | ICD-10-CM

## 2024-08-03 LAB — CBC WITH DIFFERENTIAL (CANCER CENTER ONLY)
Abs Immature Granulocytes: 0.01 10*3/uL (ref 0.00–0.07)
Basophils Absolute: 0 10*3/uL (ref 0.0–0.1)
Basophils Relative: 1 %
Eosinophils Absolute: 0.2 10*3/uL (ref 0.0–0.5)
Eosinophils Relative: 3 %
HCT: 49.1 % (ref 39.0–52.0)
Hemoglobin: 16.8 g/dL (ref 13.0–17.0)
Immature Granulocytes: 0 %
Lymphocytes Relative: 35 %
Lymphs Abs: 1.8 10*3/uL (ref 0.7–4.0)
MCH: 29.8 pg (ref 26.0–34.0)
MCHC: 34.2 g/dL (ref 30.0–36.0)
MCV: 87.2 fL (ref 80.0–100.0)
Monocytes Absolute: 0.5 10*3/uL (ref 0.1–1.0)
Monocytes Relative: 10 %
Neutro Abs: 2.6 10*3/uL (ref 1.7–7.7)
Neutrophils Relative %: 51 %
Platelet Count: 139 10*3/uL — ABNORMAL LOW (ref 150–400)
RBC: 5.63 MIL/uL (ref 4.22–5.81)
RDW: 13.7 % (ref 11.5–15.5)
WBC Count: 5.1 10*3/uL (ref 4.0–10.5)
nRBC: 0 % (ref 0.0–0.2)

## 2024-08-03 LAB — CMP (CANCER CENTER ONLY)
ALT: 15 U/L (ref 0–44)
AST: 16 U/L (ref 15–41)
Albumin: 4.4 g/dL (ref 3.5–5.0)
Alkaline Phosphatase: 63 U/L (ref 38–126)
Anion gap: 11 (ref 5–15)
BUN: 20 mg/dL (ref 8–23)
CO2: 29 mmol/L (ref 22–32)
Calcium: 9.7 mg/dL (ref 8.9–10.3)
Chloride: 99 mmol/L (ref 98–111)
Creatinine: 1.1 mg/dL (ref 0.61–1.24)
GFR, Estimated: >60 mL/min
Glucose, Bld: 143 mg/dL — ABNORMAL HIGH (ref 70–99)
Potassium: 3.7 mmol/L (ref 3.5–5.1)
Sodium: 140 mmol/L (ref 135–145)
Total Bilirubin: 0.6 mg/dL (ref 0.0–1.2)
Total Protein: 6.9 g/dL (ref 6.5–8.1)

## 2024-08-03 LAB — LACTATE DEHYDROGENASE: LDH: 144 U/L (ref 105–235)

## 2024-08-03 MED ORDER — BORTEZOMIB CHEMO SQ INJECTION 3.5 MG (2.5MG/ML)
1.3000 mg/m2 | Freq: Once | INTRAMUSCULAR | Status: AC
  Administered 2024-08-03: 2.5 mg via SUBCUTANEOUS
  Filled 2024-08-03: qty 1

## 2024-08-03 MED ORDER — DIPHENHYDRAMINE HCL 25 MG PO CAPS: 50.0000 mg | ORAL_CAPSULE | Freq: Once | ORAL | Status: DC

## 2024-08-03 MED ORDER — ACETAMINOPHEN 325 MG PO TABS: 650.0000 mg | ORAL_TABLET | Freq: Once | ORAL | Status: DC

## 2024-08-03 MED ORDER — DARATUMUMAB-HYALURONIDASE-FIHJ 1800-30000 MG-UT/15ML ~~LOC~~ SOLN
1800.0000 mg | Freq: Once | SUBCUTANEOUS | Status: AC
  Administered 2024-08-03: 1800 mg via SUBCUTANEOUS
  Filled 2024-08-03: qty 15

## 2024-08-03 NOTE — Progress Notes (Signed)
 " Hematology and Oncology Follow Up Visit  [name] [id number] 1946-06-06 79 y.o. 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  This weekend, he will be going down to [place] to get on a cruise line.  He will be gone for 14 days.  He is going to the [place].  I know that he will have a wonderful time.  His myeloma is doing well.  I told him to stop the Revlimid  right now so he will not have to take it while on his cruise.  I do not see a problem with him stopping the Revlimid  right now.  I want him to feel well on the cruise.  His myeloma studies have looked great.  When we last saw him his monoclonal spike was 0.5 g/dL.  His IgG level was 1150 mg/dL.  The Kappa light chain was up a little bit at 6.6 mg/dL.  He has had no cough.  His blood sugars are his biggest problem.  And we will through the arc today.SABRA  He has had no change in bowel or bladder habits.  There has been no bleeding.  He has had improved neuropathy which he thinks is from taking magnesium .  Overall, I would say that his performance status is probably ECOG 1.    Wt Readings from Last 3 Encounters:  08/03/24 169 lb (76.7 kg)  07/07/24 169 lb (76.7 kg)  06/01/24 171 lb (77.6 kg)    Medications:  Allergies as of 08/03/2024       Reactions   Clindamycin Hcl Itching   Famvir  [famciclovir ] Itching   Metoprolol  Succinate Other (See Comments)   Pt does not recall.        Medication List        Accurate as of August 03, 2024  8:45 AM. If you have any questions, ask your nurse or doctor.          ALPRAZolam 0.5 MG  tablet Commonly known as: XANAX Take 0.5 mg by mouth at bedtime as needed.   aspirin  EC 325 MG tablet Take 325 mg by mouth daily.   Belsomra  10 MG Tabs Generic drug: Suvorexant  Take 1 tablet (10 mg total) by mouth at bedtime as needed.   diphenhydrAMINE  25 mg capsule Commonly known as: BENADRYL  Take 50-75 mg by mouth every 6 (six) hours as needed (before tx).   docusate sodium  100 MG capsule Commonly known as: COLACE Take 100 mg by mouth daily as needed for mild constipation.   ezetimibe 10 MG tablet Commonly known as: ZETIA Take 10 mg by mouth daily.   hydrOXYzine  10 MG tablet Commonly known as: ATARAX  Take 1 tablet (10 mg total) by mouth every 8 (eight) hours as needed.   Jardiance 25 MG Tabs tablet Generic drug: empagliflozin Take 25 mg by mouth daily.   lenalidomide  15 MG capsule Commonly known as: REVLIMID  Take 1 capsule by mouth once daily for 14 days on, then 14 days off. Authorizaion # [id number]   Mounjaro 10 MG/0.5ML Pen Generic drug: tirzepatide Inject 10 mg into the skin once  a week.   omeprazole  20 MG capsule Commonly known as: PRILOSEC Take 20 mg by mouth 2 (two) times daily before a meal.   ondansetron  8 MG tablet Commonly known as: Zofran  Take 1 tablet (8 mg total) by mouth every 8 (eight) hours as needed for nausea or vomiting.   rosuvastatin  40 MG tablet Commonly known as: CRESTOR  Take 40 mg by mouth daily.   Testosterone  25 MG/2.5GM (1%) Gel Place 1 mg onto the skin every other day.   triamterene-hydrochlorothiazide 37.5-25 MG tablet Commonly known as: MAXZIDE-25 Take 1 tablet by mouth every morning.   valsartan 320 MG tablet Commonly known as: DIOVAN Take 320 mg by mouth daily.        Allergies:  Allergies  Allergen Reactions   Clindamycin Hcl Itching   Famvir  [Famciclovir ] Itching   Metoprolol  Succinate Other (See Comments)    Pt does not recall.    Past Medical History, Surgical history, Social history, and Family History  were reviewed and updated.  Review of Systems: Review of Systems  Constitutional: Negative.   HENT: Negative.    Eyes: Negative.   Respiratory: Negative.    Cardiovascular: Negative.   Gastrointestinal: Negative.   Genitourinary: Negative.   Musculoskeletal: Negative.   Skin: Negative.   Neurological: Negative.   Endo/Heme/Allergies: Negative.   Psychiatric/Behavioral: Negative.        Physical Exam:  Vital signs show a temperature of 97.7.  Pulse 62.  Blood pressure 153/70.  Weight is 169 pounds.    Wt Readings from Last 3 Encounters:  08/03/24 169 lb (76.7 kg)  07/07/24 169 lb (76.7 kg)  06/01/24 171 lb (77.6 kg)    Physical Exam Vitals reviewed.  HENT:     Head: Normocephalic and atraumatic.  Eyes:     Pupils: Pupils are equal, round, and reactive to light.  Cardiovascular:     Rate and Rhythm: Normal rate and regular rhythm.     Heart sounds: Normal heart sounds.  Pulmonary:     Effort: Pulmonary effort is normal.     Breath sounds: Normal breath sounds.  Abdominal:     General: Bowel sounds are normal.     Palpations: Abdomen is soft.  Musculoskeletal:        General: No tenderness or deformity. Normal range of motion.     Cervical back: Normal range of motion.  Lymphadenopathy:     Cervical: No cervical adenopathy.  Skin:    General: Skin is warm and dry.     Findings: No erythema or rash.  Neurological:     Mental Status: He is alert and oriented to person, place, and time.  Psychiatric:        Behavior: Behavior normal.        Thought Content: Thought content normal.        Judgment: Judgment normal.      Lab Results  Component Value Date   WBC 5.1 08/03/2024   HGB 16.8 08/03/2024   HCT 49.1 08/03/2024   MCV 87.2 08/03/2024   PLT 139 (L) 08/03/2024   Lab Results  Component Value Date   FERRITIN 56 07/07/2024   IRON 117 07/07/2024   TIBC 414 07/07/2024   UIBC 297 07/07/2024   IRONPCTSAT 28 07/07/2024   Lab Results  Component Value  Date   RETICCTPCT 1.1 01/12/2024   RBC 5.63 08/03/2024   Lab Results  Component Value Date   KPAFRELGTCHN 65.6 (H) 07/07/2024   LAMBDASER 9.4 07/07/2024   KAPLAMBRATIO 6.98 (  H) 07/07/2024   Lab Results  Component Value Date   IGGSERUM 1,133 07/07/2024   IGA 34 (L) 07/07/2024   IGMSERUM 29 07/07/2024   Lab Results  Component Value Date   TOTALPROTELP 6.4 07/07/2024   ALBUMINELP 3.6 07/07/2024   A1GS 0.2 07/07/2024   A2GS 0.8 07/07/2024   BETS 0.8 07/07/2024   GAMS 1.0 07/07/2024   MSPIKE 0.5 (H) 07/07/2024   SPEI Comment 05/20/2023     Chemistry      Component Value Date/Time   NA 139 07/07/2024 0741   NA 139 06/20/2020 0923   K 3.7 07/07/2024 0741   CL 100 07/07/2024 0741   CO2 28 07/07/2024 0741   BUN 17 07/07/2024 0741   BUN 17 06/20/2020 0923   CREATININE 1.08 07/07/2024 0741   CREATININE 1.12 09/03/2015 1201      Component Value Date/Time   CALCIUM  9.8 07/07/2024 0741   ALKPHOS 65 07/07/2024 0741   AST 19 07/07/2024 0741   ALT 15 07/07/2024 0741   BILITOT 0.5 07/07/2024 0741      Impression and Plan: Mr. Lampert is a very pleasant 79 yo caucasian gentleman with IgG kappa multiple myeloma.  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  His FISH panel was negative. Currently he is on daratumumab /Velcade /Revlimid  without Decadron .   Overall, I really think he is doing nicely.  His monoclonal studies have been doing well.  Again, I want him to have a good time on his cruise.  As such, we will hold the Revlimid  right now.  We will see what his myeloma studies look like.  I have to believe that they are looking good.  If they continue to improve, maybe we can then hold the Revlimid  permanently.    Maude JONELLE Crease, MD 2/5/20268:45 AM "

## 2024-08-03 NOTE — Patient Instructions (Signed)
 [facility] - A DEPT OF [facility]  Discharge Instructions: Thank you for choosing [facility] to provide your oncology and hematology care.   If you have a lab appointment with the Cancer Center, please go directly to the Cancer Center and check in at the registration area.  Wear comfortable clothing and clothing appropriate for easy access to any Portacath or PICC line.   We strive to give you quality time with your provider. You may need to reschedule your appointment if you arrive late (15 or more minutes).  Arriving late affects you and other patients whose appointments are after yours.  Also, if you miss three or more appointments without notifying the office, you may be dismissed from the clinic at the providers discretion.      For prescription refill requests, have your pharmacy contact our office and allow 72 hours for refills to be completed.    Today you received the following chemotherapy and/or immunotherapy agents Faspro/Velcade .      To help prevent nausea and vomiting after your treatment, we encourage you to take your nausea medication as directed.  BELOW ARE SYMPTOMS THAT SHOULD BE REPORTED IMMEDIATELY: *FEVER GREATER THAN 100.4 F (38 C) OR HIGHER *CHILLS OR SWEATING *NAUSEA AND VOMITING THAT IS NOT CONTROLLED WITH YOUR NAUSEA MEDICATION *UNUSUAL SHORTNESS OF BREATH *UNUSUAL BRUISING OR BLEEDING *URINARY PROBLEMS (pain or burning when urinating, or frequent urination) *BOWEL PROBLEMS (unusual diarrhea, constipation, pain near the anus) TENDERNESS IN MOUTH AND THROAT WITH OR WITHOUT PRESENCE OF ULCERS (sore throat, sores in mouth, or a toothache) UNUSUAL RASH, SWELLING OR PAIN  UNUSUAL VAGINAL DISCHARGE OR ITCHING   Items with * indicate a potential emergency and should be followed up as soon as possible or go to the Emergency Department if any problems should occur.  Please show the CHEMOTHERAPY ALERT CARD or  IMMUNOTHERAPY ALERT CARD at check-in to the Emergency Department and triage nurse. Should you have questions after your visit or need to cancel or reschedule your appointment, please contact [facility] - A DEPT OF [facility]  [phone] and follow the prompts.  Office hours are 8:00 a.m. to 4:30 p.m. Monday - Friday. Please note that voicemails left after 4:00 p.m. may not be returned until the following business day.  We are closed weekends and major holidays. You have access to a nurse at all times for urgent questions. Please call the main number to the clinic [phone] and follow the prompts.  For any non-urgent questions, you may also contact your provider using MyChart. We now offer e-Visits for anyone 5 and older to request care online for non-urgent symptoms. For details visit [email or web address].   Also download the MyChart app! Go to the app store, search MyChart, open the app, select , and log in with your MyChart username and password.

## 2024-08-03 NOTE — Progress Notes (Signed)
 BP remains elevated, 153/70, instructed to monitor at home and notify PCP if it remains over 140/90. Verbalized understanding.

## 2024-08-04 ENCOUNTER — Encounter: Payer: Self-pay | Admitting: Hematology & Oncology

## 2024-08-04 LAB — KAPPA/LAMBDA LIGHT CHAINS
Kappa free light chain: 55.2 mg/L — ABNORMAL HIGH (ref 3.3–19.4)
Kappa, lambda light chain ratio: 7.89 — ABNORMAL HIGH (ref 0.26–1.65)
Lambda free light chains: 7 mg/L (ref 5.7–26.3)

## 2024-08-04 LAB — IGG, IGA, IGM
IgA: 39 mg/dL — ABNORMAL LOW (ref 61–437)
IgG (Immunoglobin G), Serum: 1143 mg/dL (ref 603–1613)
IgM (Immunoglobulin M), Srm: 26 mg/dL (ref 15–143)

## 2024-08-25 ENCOUNTER — Ambulatory Visit: Admitting: Cardiovascular Disease

## 2024-08-31 ENCOUNTER — Inpatient Hospital Stay

## 2024-08-31 ENCOUNTER — Inpatient Hospital Stay: Admitting: Hematology & Oncology

## 2024-08-31 ENCOUNTER — Inpatient Hospital Stay: Attending: Hematology & Oncology

## 2024-09-28 ENCOUNTER — Inpatient Hospital Stay: Admitting: Hematology & Oncology

## 2024-09-28 ENCOUNTER — Inpatient Hospital Stay

## 2024-09-28 ENCOUNTER — Inpatient Hospital Stay: Attending: Hematology & Oncology
